# Patient Record
Sex: Female | Born: 1950 | Race: Black or African American | Hispanic: No | Smoking: Current some day smoker
Health system: Southern US, Community
[De-identification: ages and names within clinical notes are randomized; demographics above are authoritative.]

## PROBLEM LIST (undated history)

## (undated) DIAGNOSIS — I639 Cerebral infarction, unspecified: Secondary | ICD-10-CM

## (undated) DIAGNOSIS — E785 Hyperlipidemia, unspecified: Secondary | ICD-10-CM

## (undated) DIAGNOSIS — R42 Dizziness and giddiness: Secondary | ICD-10-CM

## (undated) DIAGNOSIS — F40240 Claustrophobia: Secondary | ICD-10-CM

## (undated) DIAGNOSIS — J439 Emphysema, unspecified: Secondary | ICD-10-CM

## (undated) DIAGNOSIS — J449 Chronic obstructive pulmonary disease, unspecified: Secondary | ICD-10-CM

## (undated) DIAGNOSIS — J40 Bronchitis, not specified as acute or chronic: Secondary | ICD-10-CM

## (undated) HISTORY — DX: Dizziness and giddiness: R42

## (undated) HISTORY — DX: Emphysema, unspecified: J43.9

## (undated) HISTORY — DX: Cerebral infarction, unspecified: I63.9

## (undated) HISTORY — DX: Chronic obstructive pulmonary disease, unspecified: J44.9

## (undated) HISTORY — PX: TUBAL LIGATION: SHX77

## (undated) HISTORY — DX: Bronchitis, not specified as acute or chronic: J40

## (undated) HISTORY — DX: Claustrophobia: F40.240

## (undated) HISTORY — DX: Hyperlipidemia, unspecified: E78.5

---

## 2009-02-21 ENCOUNTER — Encounter: Payer: Self-pay | Admitting: Family

## 2015-02-14 ENCOUNTER — Encounter: Payer: Self-pay | Admitting: Family

## 2015-02-14 DIAGNOSIS — J441 Chronic obstructive pulmonary disease with (acute) exacerbation: Secondary | ICD-10-CM | POA: Diagnosis not present

## 2015-02-14 DIAGNOSIS — J9601 Acute respiratory failure with hypoxia: Secondary | ICD-10-CM | POA: Diagnosis not present

## 2015-02-14 DIAGNOSIS — E785 Hyperlipidemia, unspecified: Secondary | ICD-10-CM | POA: Diagnosis not present

## 2015-02-14 DIAGNOSIS — F17210 Nicotine dependence, cigarettes, uncomplicated: Secondary | ICD-10-CM | POA: Diagnosis not present

## 2015-02-14 DIAGNOSIS — R0602 Shortness of breath: Secondary | ICD-10-CM | POA: Diagnosis not present

## 2015-02-14 DIAGNOSIS — J189 Pneumonia, unspecified organism: Secondary | ICD-10-CM | POA: Diagnosis not present

## 2015-02-14 DIAGNOSIS — J440 Chronic obstructive pulmonary disease with acute lower respiratory infection: Secondary | ICD-10-CM | POA: Diagnosis not present

## 2015-02-15 DIAGNOSIS — J9601 Acute respiratory failure with hypoxia: Secondary | ICD-10-CM | POA: Diagnosis not present

## 2015-02-15 DIAGNOSIS — R0602 Shortness of breath: Secondary | ICD-10-CM | POA: Diagnosis not present

## 2015-02-15 DIAGNOSIS — Z720 Tobacco use: Secondary | ICD-10-CM | POA: Diagnosis not present

## 2015-02-15 DIAGNOSIS — R0902 Hypoxemia: Secondary | ICD-10-CM | POA: Diagnosis not present

## 2015-02-15 DIAGNOSIS — J440 Chronic obstructive pulmonary disease with acute lower respiratory infection: Secondary | ICD-10-CM | POA: Diagnosis present

## 2015-02-15 DIAGNOSIS — J441 Chronic obstructive pulmonary disease with (acute) exacerbation: Secondary | ICD-10-CM | POA: Diagnosis not present

## 2015-02-15 DIAGNOSIS — F17210 Nicotine dependence, cigarettes, uncomplicated: Secondary | ICD-10-CM | POA: Diagnosis not present

## 2015-02-15 DIAGNOSIS — Z8673 Personal history of transient ischemic attack (TIA), and cerebral infarction without residual deficits: Secondary | ICD-10-CM | POA: Diagnosis not present

## 2015-02-15 DIAGNOSIS — J189 Pneumonia, unspecified organism: Secondary | ICD-10-CM | POA: Diagnosis not present

## 2015-02-15 DIAGNOSIS — E785 Hyperlipidemia, unspecified: Secondary | ICD-10-CM | POA: Diagnosis not present

## 2015-02-15 DIAGNOSIS — R0789 Other chest pain: Secondary | ICD-10-CM | POA: Diagnosis not present

## 2015-02-22 DIAGNOSIS — J189 Pneumonia, unspecified organism: Secondary | ICD-10-CM | POA: Diagnosis not present

## 2015-02-22 DIAGNOSIS — J432 Centrilobular emphysema: Secondary | ICD-10-CM | POA: Diagnosis not present

## 2015-02-22 DIAGNOSIS — Z6831 Body mass index (BMI) 31.0-31.9, adult: Secondary | ICD-10-CM | POA: Diagnosis not present

## 2015-02-22 DIAGNOSIS — Z720 Tobacco use: Secondary | ICD-10-CM | POA: Diagnosis not present

## 2015-02-22 DIAGNOSIS — J439 Emphysema, unspecified: Secondary | ICD-10-CM | POA: Diagnosis not present

## 2015-02-22 DIAGNOSIS — J479 Bronchiectasis, uncomplicated: Secondary | ICD-10-CM | POA: Diagnosis not present

## 2015-03-13 DIAGNOSIS — J432 Centrilobular emphysema: Secondary | ICD-10-CM | POA: Diagnosis not present

## 2015-03-13 DIAGNOSIS — J479 Bronchiectasis, uncomplicated: Secondary | ICD-10-CM | POA: Diagnosis not present

## 2015-03-13 DIAGNOSIS — Z720 Tobacco use: Secondary | ICD-10-CM | POA: Diagnosis not present

## 2015-03-13 DIAGNOSIS — Q211 Atrial septal defect: Secondary | ICD-10-CM | POA: Diagnosis not present

## 2015-04-11 DIAGNOSIS — F17200 Nicotine dependence, unspecified, uncomplicated: Secondary | ICD-10-CM | POA: Diagnosis not present

## 2015-04-11 DIAGNOSIS — D72829 Elevated white blood cell count, unspecified: Secondary | ICD-10-CM | POA: Diagnosis not present

## 2015-04-11 DIAGNOSIS — R51 Headache: Secondary | ICD-10-CM | POA: Diagnosis not present

## 2015-04-11 DIAGNOSIS — J440 Chronic obstructive pulmonary disease with acute lower respiratory infection: Secondary | ICD-10-CM | POA: Diagnosis not present

## 2015-04-11 DIAGNOSIS — T380X5A Adverse effect of glucocorticoids and synthetic analogues, initial encounter: Secondary | ICD-10-CM | POA: Diagnosis not present

## 2015-04-11 DIAGNOSIS — J441 Chronic obstructive pulmonary disease with (acute) exacerbation: Secondary | ICD-10-CM | POA: Diagnosis not present

## 2015-04-11 DIAGNOSIS — J209 Acute bronchitis, unspecified: Secondary | ICD-10-CM | POA: Diagnosis not present

## 2015-04-11 DIAGNOSIS — J189 Pneumonia, unspecified organism: Secondary | ICD-10-CM | POA: Diagnosis not present

## 2015-04-11 DIAGNOSIS — R0902 Hypoxemia: Secondary | ICD-10-CM | POA: Diagnosis not present

## 2015-04-11 DIAGNOSIS — R42 Dizziness and giddiness: Secondary | ICD-10-CM | POA: Diagnosis not present

## 2015-04-11 DIAGNOSIS — J449 Chronic obstructive pulmonary disease, unspecified: Secondary | ICD-10-CM | POA: Diagnosis not present

## 2015-04-11 DIAGNOSIS — J45909 Unspecified asthma, uncomplicated: Secondary | ICD-10-CM | POA: Diagnosis not present

## 2015-04-11 DIAGNOSIS — R05 Cough: Secondary | ICD-10-CM | POA: Diagnosis not present

## 2015-04-11 DIAGNOSIS — R0602 Shortness of breath: Secondary | ICD-10-CM | POA: Diagnosis not present

## 2015-04-11 DIAGNOSIS — E876 Hypokalemia: Secondary | ICD-10-CM | POA: Diagnosis not present

## 2015-04-11 DIAGNOSIS — F17210 Nicotine dependence, cigarettes, uncomplicated: Secondary | ICD-10-CM | POA: Diagnosis not present

## 2015-04-12 DIAGNOSIS — E669 Obesity, unspecified: Secondary | ICD-10-CM | POA: Diagnosis present

## 2015-04-12 DIAGNOSIS — Z602 Problems related to living alone: Secondary | ICD-10-CM | POA: Diagnosis present

## 2015-04-12 DIAGNOSIS — R42 Dizziness and giddiness: Secondary | ICD-10-CM | POA: Diagnosis not present

## 2015-04-12 DIAGNOSIS — E7800 Pure hypercholesterolemia, unspecified: Secondary | ICD-10-CM | POA: Diagnosis present

## 2015-04-12 DIAGNOSIS — J440 Chronic obstructive pulmonary disease with acute lower respiratory infection: Secondary | ICD-10-CM | POA: Diagnosis present

## 2015-04-12 DIAGNOSIS — J441 Chronic obstructive pulmonary disease with (acute) exacerbation: Secondary | ICD-10-CM | POA: Diagnosis not present

## 2015-04-12 DIAGNOSIS — J181 Lobar pneumonia, unspecified organism: Secondary | ICD-10-CM | POA: Diagnosis not present

## 2015-04-12 DIAGNOSIS — R000 Tachycardia, unspecified: Secondary | ICD-10-CM | POA: Diagnosis not present

## 2015-04-12 DIAGNOSIS — J189 Pneumonia, unspecified organism: Secondary | ICD-10-CM | POA: Diagnosis not present

## 2015-04-12 DIAGNOSIS — R0902 Hypoxemia: Secondary | ICD-10-CM | POA: Diagnosis not present

## 2015-04-12 DIAGNOSIS — F17200 Nicotine dependence, unspecified, uncomplicated: Secondary | ICD-10-CM | POA: Diagnosis not present

## 2015-04-12 DIAGNOSIS — R51 Headache: Secondary | ICD-10-CM | POA: Diagnosis not present

## 2015-04-12 DIAGNOSIS — D72829 Elevated white blood cell count, unspecified: Secondary | ICD-10-CM | POA: Diagnosis present

## 2015-04-12 DIAGNOSIS — E876 Hypokalemia: Secondary | ICD-10-CM | POA: Diagnosis not present

## 2015-04-12 DIAGNOSIS — J209 Acute bronchitis, unspecified: Secondary | ICD-10-CM | POA: Diagnosis present

## 2015-04-12 DIAGNOSIS — T380X5A Adverse effect of glucocorticoids and synthetic analogues, initial encounter: Secondary | ICD-10-CM | POA: Diagnosis present

## 2015-04-12 DIAGNOSIS — I10 Essential (primary) hypertension: Secondary | ICD-10-CM | POA: Diagnosis present

## 2015-04-12 DIAGNOSIS — R739 Hyperglycemia, unspecified: Secondary | ICD-10-CM | POA: Diagnosis present

## 2015-04-12 DIAGNOSIS — J45909 Unspecified asthma, uncomplicated: Secondary | ICD-10-CM | POA: Diagnosis present

## 2015-04-12 DIAGNOSIS — F17210 Nicotine dependence, cigarettes, uncomplicated: Secondary | ICD-10-CM | POA: Diagnosis present

## 2015-04-12 DIAGNOSIS — Z6831 Body mass index (BMI) 31.0-31.9, adult: Secondary | ICD-10-CM | POA: Diagnosis not present

## 2015-04-17 DIAGNOSIS — Z6832 Body mass index (BMI) 32.0-32.9, adult: Secondary | ICD-10-CM | POA: Diagnosis not present

## 2015-04-17 DIAGNOSIS — J42 Unspecified chronic bronchitis: Secondary | ICD-10-CM | POA: Diagnosis not present

## 2015-04-17 DIAGNOSIS — Z9981 Dependence on supplemental oxygen: Secondary | ICD-10-CM | POA: Diagnosis not present

## 2015-04-17 DIAGNOSIS — J449 Chronic obstructive pulmonary disease, unspecified: Secondary | ICD-10-CM | POA: Diagnosis not present

## 2015-05-01 DIAGNOSIS — E785 Hyperlipidemia, unspecified: Secondary | ICD-10-CM | POA: Diagnosis not present

## 2015-05-01 DIAGNOSIS — F17200 Nicotine dependence, unspecified, uncomplicated: Secondary | ICD-10-CM | POA: Diagnosis not present

## 2015-05-01 DIAGNOSIS — J479 Bronchiectasis, uncomplicated: Secondary | ICD-10-CM | POA: Diagnosis not present

## 2015-05-01 DIAGNOSIS — J441 Chronic obstructive pulmonary disease with (acute) exacerbation: Secondary | ICD-10-CM | POA: Diagnosis not present

## 2015-05-01 DIAGNOSIS — Z79899 Other long term (current) drug therapy: Secondary | ICD-10-CM | POA: Diagnosis not present

## 2015-05-01 DIAGNOSIS — Z720 Tobacco use: Secondary | ICD-10-CM | POA: Diagnosis not present

## 2015-05-01 DIAGNOSIS — J189 Pneumonia, unspecified organism: Secondary | ICD-10-CM | POA: Diagnosis not present

## 2015-05-01 DIAGNOSIS — J432 Centrilobular emphysema: Secondary | ICD-10-CM | POA: Diagnosis not present

## 2015-05-15 DIAGNOSIS — I2510 Atherosclerotic heart disease of native coronary artery without angina pectoris: Secondary | ICD-10-CM | POA: Diagnosis not present

## 2015-05-15 DIAGNOSIS — E7800 Pure hypercholesterolemia, unspecified: Secondary | ICD-10-CM | POA: Diagnosis not present

## 2015-05-15 DIAGNOSIS — J449 Chronic obstructive pulmonary disease, unspecified: Secondary | ICD-10-CM | POA: Diagnosis not present

## 2015-05-15 DIAGNOSIS — R0602 Shortness of breath: Secondary | ICD-10-CM | POA: Diagnosis not present

## 2015-05-15 DIAGNOSIS — I10 Essential (primary) hypertension: Secondary | ICD-10-CM | POA: Diagnosis not present

## 2015-06-05 DIAGNOSIS — M25511 Pain in right shoulder: Secondary | ICD-10-CM | POA: Diagnosis not present

## 2015-06-05 DIAGNOSIS — J449 Chronic obstructive pulmonary disease, unspecified: Secondary | ICD-10-CM | POA: Diagnosis not present

## 2015-06-05 DIAGNOSIS — Z79899 Other long term (current) drug therapy: Secondary | ICD-10-CM | POA: Diagnosis not present

## 2015-06-05 DIAGNOSIS — F17200 Nicotine dependence, unspecified, uncomplicated: Secondary | ICD-10-CM | POA: Diagnosis not present

## 2015-06-05 DIAGNOSIS — E785 Hyperlipidemia, unspecified: Secondary | ICD-10-CM | POA: Diagnosis not present

## 2015-06-19 DIAGNOSIS — J432 Centrilobular emphysema: Secondary | ICD-10-CM | POA: Diagnosis not present

## 2015-06-19 DIAGNOSIS — J479 Bronchiectasis, uncomplicated: Secondary | ICD-10-CM | POA: Diagnosis not present

## 2015-06-19 DIAGNOSIS — Z720 Tobacco use: Secondary | ICD-10-CM | POA: Diagnosis not present

## 2015-06-22 DIAGNOSIS — Z6832 Body mass index (BMI) 32.0-32.9, adult: Secondary | ICD-10-CM | POA: Diagnosis not present

## 2015-06-22 DIAGNOSIS — G8321 Monoplegia of upper limb affecting right dominant side: Secondary | ICD-10-CM | POA: Diagnosis not present

## 2015-06-22 DIAGNOSIS — M542 Cervicalgia: Secondary | ICD-10-CM | POA: Diagnosis not present

## 2015-07-13 DIAGNOSIS — M25511 Pain in right shoulder: Secondary | ICD-10-CM | POA: Diagnosis not present

## 2015-07-13 DIAGNOSIS — Z6832 Body mass index (BMI) 32.0-32.9, adult: Secondary | ICD-10-CM | POA: Diagnosis not present

## 2015-07-13 DIAGNOSIS — M542 Cervicalgia: Secondary | ICD-10-CM | POA: Diagnosis not present

## 2015-07-18 DIAGNOSIS — D492 Neoplasm of unspecified behavior of bone, soft tissue, and skin: Secondary | ICD-10-CM | POA: Diagnosis not present

## 2015-07-18 DIAGNOSIS — L578 Other skin changes due to chronic exposure to nonionizing radiation: Secondary | ICD-10-CM | POA: Diagnosis not present

## 2015-07-18 DIAGNOSIS — L918 Other hypertrophic disorders of the skin: Secondary | ICD-10-CM | POA: Diagnosis not present

## 2015-07-18 DIAGNOSIS — L820 Inflamed seborrheic keratosis: Secondary | ICD-10-CM | POA: Diagnosis not present

## 2015-11-09 ENCOUNTER — Encounter: Payer: Self-pay | Admitting: Internal Medicine

## 2015-11-09 ENCOUNTER — Ambulatory Visit (INDEPENDENT_AMBULATORY_CARE_PROVIDER_SITE_OTHER): Payer: Medicare Other | Admitting: Internal Medicine

## 2015-11-09 VITALS — BP 108/66 | HR 101 | Ht 65.0 in | Wt 180.0 lb

## 2015-11-09 DIAGNOSIS — Z8701 Personal history of pneumonia (recurrent): Secondary | ICD-10-CM

## 2015-11-09 DIAGNOSIS — J441 Chronic obstructive pulmonary disease with (acute) exacerbation: Secondary | ICD-10-CM | POA: Diagnosis not present

## 2015-11-09 DIAGNOSIS — F17200 Nicotine dependence, unspecified, uncomplicated: Secondary | ICD-10-CM | POA: Diagnosis not present

## 2015-11-09 DIAGNOSIS — J449 Chronic obstructive pulmonary disease, unspecified: Secondary | ICD-10-CM | POA: Diagnosis not present

## 2015-11-09 MED ORDER — FLUTICASONE-SALMETEROL 250-50 MCG/DOSE IN AEPB
1.0000 | INHALATION_SPRAY | Freq: Two times a day (BID) | RESPIRATORY_TRACT | 0 refills | Status: DC
Start: 2015-11-09 — End: 2015-11-09

## 2015-11-09 MED ORDER — PREDNISONE 10 MG PO TABS: ORAL_TABLET | ORAL | 0 refills | Status: DC

## 2015-11-09 MED ORDER — DOXYCYCLINE HYCLATE 100 MG PO TABS: 100.0000 mg | ORAL_TABLET | Freq: Two times a day (BID) | ORAL | 0 refills | Status: DC

## 2015-11-09 MED ORDER — LEVALBUTEROL HCL 0.63 MG/3ML IN NEBU
0.6300 mg | INHALATION_SOLUTION | Freq: Once | RESPIRATORY_TRACT | Status: AC
  Administered 2015-11-09: 0.63 mg via RESPIRATORY_TRACT

## 2015-11-09 MED ORDER — TIOTROPIUM BROMIDE MONOHYDRATE 18 MCG IN CAPS: 18.0000 ug | ORAL_CAPSULE | Freq: Every day | RESPIRATORY_TRACT | 11 refills | Status: DC

## 2015-11-09 MED ORDER — METHYLPREDNISOLONE ACETATE 80 MG/ML IJ SUSP
80.0000 mg | Freq: Once | INTRAMUSCULAR | Status: AC
  Administered 2015-11-09: 80 mg via INTRAMUSCULAR

## 2015-11-09 MED ORDER — FLUTICASONE-SALMETEROL 250-50 MCG/DOSE IN AEPB: 1.0000 | INHALATION_SPRAY | Freq: Two times a day (BID) | RESPIRATORY_TRACT | 11 refills | Status: DC

## 2015-11-09 NOTE — Progress Notes (Signed)
Subjective:    Patient ID: Rachel Wang, female    DOB: 1950/03/21, 65 y.o.   MRN: [id number]  PCP PROVIDER NOT IN SYSTEM  HPI  IOV 11/09/2015  Chief Complaint  Patient presents with  . Pulmonary Consult    Self Referral, Pt. c/o recently moved here, having to use her oxygen more, has a portable tank as well as one at home, SOB all the time, been out of her inhalers since wed., prior to running out she was using them alot, coughing with phelgm,      [name]. Recently moved from [place] to [place], [place] in the last few months after recently being married. She reports a long-standing history of COPD. She does not know her severe anemia but she says it is quite severe. At baseline she is on oxygen with exertion and triple inhaler therapy with Spiriva and Advair. She used to be taken care of her pulmonologist at [facility]. Also record showed alpha-1 MM. She has a sister with COPD on oxygen. Her father died from AIDS. She tells me that overall she's been stable except that in January 2017 she had an admission for pneumonia and COPD exacerbation. She then had another admission for the same in March 2017. Around this time she did have a CT chest for her history that showed clearance of the pneumonia. The only report I have is a CT chest from January 2017. I do not have the images for visualization with the reports a severe emphysema and patchy bilateral airspace disease compatible with pneumonia. She says when she moved here she was stable she had her oxygen but she's run out of her inhalers. The last few weeks a COPD has had exacerbation due to running out of inhalers and also the change in weather. She is more short of breath, more wheezing. Earlier this week she nearly passed out when she was off oxygen while shopping at [place].  Unfortunately she still smoking   has a past medical history of Bronchitis; COPD (chronic obstructive pulmonary disease)  ([place]); and Emphysema of lung ([place]).   reports that she has been smoking Cigarettes.  She has smoked for the past 20.00 years. She has never used smokeless tobacco.  No past surgical history on file.  Allergies  Allergen Reactions  . Levofloxacin Nausea And Vomiting  . Tramadol Nausea And Vomiting     There is no immunization history on file for this patient.  No family history on file.   Current Outpatient Prescriptions:  .  aspirin 81 MG chewable tablet, Chew 81 mg by mouth daily., Disp: , Rfl:  .  diclofenac (VOLTAREN) 75 MG EC tablet, Take 75 mg by mouth 2 (two) times daily., Disp: , Rfl:  .  SIMVASTATIN PO, Take 45 mg by mouth., Disp: , Rfl:  .  budesonide (PULMICORT) 0.5 MG/2ML nebulizer solution, Take 0.5 mg by nebulization 2 (two) times daily., Disp: , Rfl:  .  Fluticasone-Salmeterol (ADVAIR DISKUS) 250-50 MCG/DOSE AEPB, Inhale 1 puff into the lungs 2 (two) times daily., Disp: , Rfl:  .  tiotropium (SPIRIVA) 18 MCG inhalation capsule, Place 18 mcg into inhaler and inhale daily., Disp: , Rfl:     Review of Systems  Constitutional: Positive for fever. Negative for unexpected weight change.  HENT: Positive for congestion. Negative for dental problem, ear pain, nosebleeds, postnasal drip, rhinorrhea, sinus pressure, sneezing, sore throat and trouble swallowing.   Eyes: Negative.  Negative for redness and itching.  Respiratory: Positive for  cough, chest tightness, shortness of breath and wheezing.   Cardiovascular: Negative.  Negative for palpitations and leg swelling.  Gastrointestinal: Positive for vomiting. Negative for nausea.  Endocrine: Negative.   Genitourinary: Negative.  Negative for dysuria.  Musculoskeletal: Negative.  Negative for joint swelling.  Skin: Positive for rash.  Allergic/Immunologic: Negative.   Neurological: Negative for headaches.  Hematological: Negative.  Does not bruise/bleed easily.  Psychiatric/Behavioral: Negative.  Negative for dysphoric  mood. The patient is not nervous/anxious.        Objective:   Physical Exam  Constitutional: She is oriented to person, place, and time. She appears well-developed and well-nourished. No distress.  HENT:  Head: Normocephalic and atraumatic.  Right Ear: External ear normal.  Left Ear: External ear normal.  Mouth/Throat: Oropharynx is clear and moist. No oropharyngeal exudate.  Eyes: Conjunctivae and EOM are normal. Pupils are equal, round, and reactive to light. Right eye exhibits no discharge. Left eye exhibits no discharge. No scleral icterus.  Neck: Normal range of motion. Neck supple. No JVD present. No tracheal deviation present. No thyromegaly present.  Cardiovascular: Normal rate, regular rhythm, normal heart sounds and intact distal pulses.  Exam reveals no gallop and no friction rub.   No murmur heard. Pulmonary/Chest: Effort normal. No respiratory distress. She has no wheezes. She has rales. She exhibits no tenderness.  Looks well without his work of breathing but has significant bilateral wheezing present  Abdominal: Soft. Bowel sounds are normal. She exhibits no distension and no mass. There is no tenderness. There is no rebound and no guarding.  Musculoskeletal: Normal range of motion. She exhibits no edema or tenderness.  Lymphadenopathy:    She has no cervical adenopathy.  Neurological: She is alert and oriented to person, place, and time. She has normal reflexes. No cranial nerve deficit. She exhibits normal muscle tone. Coordination normal.  Skin: Skin is warm and dry. No rash noted. She is not diaphoretic. No erythema. No pallor.  Psychiatric: She has a normal mood and affect. Her behavior is normal. Judgment and thought content normal.  Vitals reviewed.   Vitals:   11/09/15 1140  BP: 108/66  Pulse: (!) 101  SpO2: 92%  Weight: 180 lb (81.6 kg)  Height: 5\' 5"  (1.651 m)   Estimated body mass index is 29.95 kg/m as calculated from the following:   Height as of this  encounter: 5\' 5"  (1.651 m).   Weight as of this encounter: 180 lb (81.6 kg).        Assessment & Plan:     ICD-9-CM ICD-10-CM   1. COPD exacerbation ([place]) 491.21 J44.1   2. Chronic obstructive pulmonary disease, unspecified COPD type ([place]) 496 J44.9   3. Current every day smoker 305.1 F17.200   4. History of pneumonia V12.61 Z87.01        Xopenex neb in office IM depot medrol 80mg  IM x 1 in office Take doxycycline 100mg  po twice daily x 5 days; take after meals and avoid sunlight Take prednisone 40 mg daily x 2 days, then 20mg  daily x 2 days, then 10mg  daily x 2 days, then 5mg  daily x 2 days and stop Restart spiriva and advair -t ake sample/script Continue o2 with exertion Use albuterol as needed - take script Quit smoking  Do full PFT in 3 weeks Do CT chest in 3 weeks  Followup In 3 weeks with APP or Dr Chase Caller (AM appt only) -after PFT and CT chest  - discuss results of PFT and CT andprogres  -  discuss vaccination at followup      Dr. Brand Males, M.D., [facility].C.P Pulmonary and Critical Care Medicine Staff Physician [facility] Pager: [phone], If no answer or between  15:00h - 7:00h: call [phone]  11/09/2015 12:13 PM

## 2015-11-09 NOTE — Patient Instructions (Signed)
ICD-9-CM ICD-10-CM   1. COPD exacerbation ([place]) 491.21 J44.1   2. Chronic obstructive pulmonary disease, unspecified COPD type ([place]) 496 J44.9   3. Current every day smoker 305.1 F17.200   4. History of pneumonia V12.61 Z87.01      Xopenex neb in office IM depot medrol 80mg  IM x 1 in office Take doxycycline 100mg  po twice daily x 5 days; take after meals and avoid sunlight Take prednisone 40 mg daily x 2 days, then 20mg  daily x 2 days, then 10mg  daily x 2 days, then 5mg  daily x 2 days and stop Restart spiriva and advair -t ake sample/script Continue o2 with exertion Use albuterol as needed - take script Quit smoking  Do full PFT in 3 weeks Do CT chest in 3 weeks  Followup In 3 weeks with APP or Dr Chase Caller (AM appt only) -after PFT and CT chest  - discuss results of PFT and CT andprogres  - discuss vaccination at followup

## 2015-11-16 ENCOUNTER — Encounter: Payer: Self-pay | Admitting: Internal Medicine

## 2015-11-19 ENCOUNTER — Encounter: Payer: Self-pay | Admitting: Internal Medicine

## 2015-12-04 ENCOUNTER — Ambulatory Visit (INDEPENDENT_AMBULATORY_CARE_PROVIDER_SITE_OTHER)
Admission: RE | Admit: 2015-12-04 | Discharge: 2015-12-04 | Disposition: A | Discharge status: Home / Self Care | Payer: Medicare Other | Source: Ambulatory Visit | Attending: Internal Medicine | Admitting: Internal Medicine

## 2015-12-04 DIAGNOSIS — J449 Chronic obstructive pulmonary disease, unspecified: Secondary | ICD-10-CM

## 2015-12-04 DIAGNOSIS — F17200 Nicotine dependence, unspecified, uncomplicated: Secondary | ICD-10-CM

## 2015-12-04 DIAGNOSIS — J439 Emphysema, unspecified: Secondary | ICD-10-CM | POA: Diagnosis not present

## 2015-12-05 ENCOUNTER — Ambulatory Visit: Payer: Medicare Other | Admitting: Internal Medicine

## 2015-12-05 ENCOUNTER — Encounter (HOSPITAL_COMMUNITY): Payer: Medicare Other

## 2015-12-28 ENCOUNTER — Ambulatory Visit (HOSPITAL_COMMUNITY)
Admission: RE | Admit: 2015-12-28 | Discharge: 2015-12-28 | Disposition: A | Discharge status: Home / Self Care | Payer: Medicare Other | Source: Ambulatory Visit | Attending: Internal Medicine | Admitting: Internal Medicine

## 2015-12-28 ENCOUNTER — Encounter: Payer: Self-pay | Admitting: Adult Health

## 2015-12-28 ENCOUNTER — Ambulatory Visit: Payer: Medicare Other | Admitting: Internal Medicine

## 2015-12-28 ENCOUNTER — Ambulatory Visit (INDEPENDENT_AMBULATORY_CARE_PROVIDER_SITE_OTHER): Payer: Medicare Other | Admitting: Adult Health

## 2015-12-28 VITALS — BP 100/60 | HR 100 | Temp 97.3°F | Ht 65.0 in | Wt 183.4 lb

## 2015-12-28 DIAGNOSIS — J441 Chronic obstructive pulmonary disease with (acute) exacerbation: Secondary | ICD-10-CM | POA: Insufficient documentation

## 2015-12-28 DIAGNOSIS — I2510 Atherosclerotic heart disease of native coronary artery without angina pectoris: Secondary | ICD-10-CM

## 2015-12-28 DIAGNOSIS — F17200 Nicotine dependence, unspecified, uncomplicated: Secondary | ICD-10-CM

## 2015-12-28 DIAGNOSIS — J449 Chronic obstructive pulmonary disease, unspecified: Secondary | ICD-10-CM | POA: Diagnosis not present

## 2015-12-28 LAB — PULMONARY FUNCTION TEST
DL/VA % pred: 78 %
DL/VA: 3.87 ml/min/mmHg/L
DLCO UNC: 9.64 ml/min/mmHg
DLCO unc % pred: 37 %
FEF 25-75 Post: 1.23 L/sec
FEF 25-75 Pre: 0.99 L/sec
FEF2575-%Change-Post: 24 %
FEF2575-%PRED-PRE: 50 %
FEF2575-%Pred-Post: 63 %
FEV1-%Change-Post: 5 %
FEV1-%PRED-POST: 63 %
FEV1-%PRED-PRE: 60 %
FEV1-PRE: 1.24 L
FEV1-Post: 1.3 L
FEV1FVC-%Change-Post: 3 %
FEV1FVC-%Pred-Pre: 95 %
FEV6-%Change-Post: 1 %
FEV6-%PRED-POST: 65 %
FEV6-%PRED-PRE: 64 %
FEV6-POST: 1.66 L
FEV6-Pre: 1.64 L
FEV6FVC-%CHANGE-POST: 0 %
FEV6FVC-%PRED-POST: 102 %
FEV6FVC-%Pred-Pre: 102 %
FVC-%Change-Post: 1 %
FVC-%PRED-PRE: 63 %
FVC-%Pred-Post: 63 %
FVC-POST: 1.68 L
FVC-PRE: 1.66 L
POST FEV6/FVC RATIO: 99 %
PRE FEV1/FVC RATIO: 75 %
Post FEV1/FVC ratio: 78 %
Pre FEV6/FVC Ratio: 99 %
RV % pred: 89 %
RV: 1.92 L
TLC % PRED: 67 %
TLC: 3.52 L

## 2015-12-28 MED ORDER — ALBUTEROL SULFATE (2.5 MG/3ML) 0.083% IN NEBU
2.5000 mg | INHALATION_SOLUTION | Freq: Once | RESPIRATORY_TRACT | Status: AC
  Administered 2015-12-28: 2.5 mg via RESPIRATORY_TRACT

## 2015-12-28 MED ORDER — UMECLIDINIUM BROMIDE 62.5 MCG/INH IN AEPB
1.0000 | INHALATION_SPRAY | Freq: Every day | RESPIRATORY_TRACT | 0 refills | Status: DC
Start: 2015-12-28 — End: 2015-12-28

## 2015-12-28 MED ORDER — UMECLIDINIUM BROMIDE 62.5 MCG/INH IN AEPB: 1.0000 | INHALATION_SPRAY | Freq: Every day | RESPIRATORY_TRACT | 4 refills | Status: DC

## 2015-12-28 NOTE — Patient Instructions (Addendum)
Stop Spiriva  Begin Incruse 1 puff daily .  Continue on Advair 1 puff Twice daily  .  Rinse after inhaler use.  Work on not smoking .  follow up Dr. Chase Caller in 3-4 months and As needed   Refer for Primary MD to establish , discuss with them plaque buildup in arteries that was seen on Chest CT

## 2015-12-28 NOTE — Progress Notes (Signed)
Subjective:    Patient ID: Rachel Wang, female    DOB: 08-12-1950, 65 y.o.   MRN: [id number]  HPI 65 yo female smoker followed for moderate COPD .   TEST  Alpha 1 reported as MM   12/28/2015 Follow up : COPD /PFT/CT results.  Pt returns for 6 weeks follow up .  She was seen for pulmonary consult to establish for COPD. She recently moved here from [place]. She is on Advair and Spiriva. Uses oxygen with act As needed  And At bedtime   And tx for COPD exacerbation with Doxycycline and prednisone taper.  She is feeling better. Does have some daily cough and gets winded with activity .  She was set up for CT and PFT which we revewied today .  CT chest 12/04/15 showed emphysematous changes and pulmonary scarring w/ no acute process.  Did show age advance 3 vessel coronary artery califications.  PFT 12/28/15 shows FEV1 63%, ratio 78, FVC 63%, DLCO 37, TLC 67%.  Spiriva no longer covered. We discussed alternatives that are covered by her insurance as  She continues to smoking , advised on cessation.  Declines flu shot .      Past Medical History:  Diagnosis Date  . Bronchitis   . COPD (chronic obstructive pulmonary disease) ([place])   . Emphysema of lung [facility])    Current Outpatient Prescriptions on File Prior to Visit  Medication Sig Dispense Refill  . aspirin 81 MG chewable tablet Chew 81 mg by mouth daily.    . budesonide (PULMICORT) 0.5 MG/2ML nebulizer solution Take 0.5 mg by nebulization 2 (two) times daily.    . diclofenac (VOLTAREN) 75 MG EC tablet Take 75 mg by mouth 2 (two) times daily.    . Fluticasone-Salmeterol (ADVAIR DISKUS) 250-50 MCG/DOSE AEPB Inhale 1 puff into the lungs 2 (two) times daily. 60 each 11  . SIMVASTATIN PO Take 45 mg by mouth.    . tiotropium (SPIRIVA) 18 MCG inhalation capsule Place 1 capsule (18 mcg total) into inhaler and inhale daily. 30 capsule 11   No current facility-administered medications on file prior to visit.     Review of  Systems Constitutional:   No  weight loss, night sweats,  Fevers, chills +, fatigue, or  lassitude.  HEENT:   No headaches,  Difficulty swallowing,  Tooth/dental problems, or  Sore throat,                No sneezing, itching, ear ache, nasal congestion, post nasal drip,   CV:  No chest pain,  Orthopnea, PND, swelling in lower extremities, anasarca, dizziness, palpitations, syncope.   GI  No heartburn, indigestion, abdominal pain, nausea, vomiting, diarrhea, change in bowel habits, loss of appetite, bloody stools.   Resp:   No chest wall deformity  Skin: no rash or lesions.  GU: no dysuria, change in color of urine, no urgency or frequency.  No flank pain, no hematuria   MS:  No joint pain or swelling.  No decreased range of motion.  No back pain.  Psych:  No change in mood or affect. No depression or anxiety.  No memory loss.         Objective:   Physical Exam   Vitals:   12/28/15 1421  BP: 100/60  Pulse: 100  Temp: 97.3 F (36.3 C)  TempSrc: Oral  SpO2: 94%  Weight: 183 lb 6.4 oz (83.2 kg)  Height: 5\' 5"  (1.651 m)    GEN: A/Ox3; pleasant ,  NAD, well nourished    HEENT:  [place]/AT,  EACs-clear, TMs-wnl, NOSE-clear, THROAT-clear, no lesions, no postnasal drip or exudate noted.   NECK:  Supple w/ fair ROM; no JVD; normal carotid impulses w/o bruits; no thyromegaly or nodules palpated; no lymphadenopathy.    RESP  Clear  P & A; w/o, wheezes/ rales/ or rhonchi. no accessory muscle use, no dullness to percussion  CARD:  RRR, no m/r/g  , no peripheral edema, pulses intact, no cyanosis or clubbing.  GI:   Soft & nt; nml bowel sounds; no organomegaly or masses detected.   Musco: Warm bil, no deformities or joint swelling noted.   Neuro: alert, no focal deficits noted.    Skin: Warm, no lesions or rashes   CT chest 12/04/15 Emphysematous changes and pulmonary scarring but no acute overlying pulmonary process or worrisome pulmonary lesions.  No mediastinal or hilar  mass or adenopathy.  Age advanced three-vessel coronary artery calcifications.   Alonie Gazzola NP-C  [facility]  12/28/2015        Assessment & Plan:

## 2015-12-31 NOTE — Assessment & Plan Note (Signed)
Smoking cessation discussed 

## 2015-12-31 NOTE — Assessment & Plan Note (Addendum)
Moderate COPD in active smoker Smoking cessation is key .  Change Spiriva to Incruse d/t insurance coverage CT does show COPD changes  Will refer to PCP to establish , CT does show some plaque buildup in arteries , will need to determine if additional testing indicated . Plan  Patient Instructions  Stop Spiriva  Begin Incruse 1 puff daily .  Continue on Advair 1 puff Twice daily  .  Rinse after inhaler use.  Work on not smoking .  follow up Dr. Chase Caller in 3-4 months and As needed   Refer for Primary MD to establish , discuss with them plaque buildup in arteries that was seen on Chest CT

## 2016-01-16 ENCOUNTER — Ambulatory Visit: Payer: Medicare Other | Admitting: Family

## 2016-01-25 ENCOUNTER — Ambulatory Visit (INDEPENDENT_AMBULATORY_CARE_PROVIDER_SITE_OTHER): Payer: Medicare Other | Admitting: Family

## 2016-01-25 ENCOUNTER — Encounter: Payer: Self-pay | Admitting: Family

## 2016-01-25 VITALS — BP 118/80 | HR 95 | Temp 98.2°F | Resp 18 | Ht 65.0 in | Wt 185.0 lb

## 2016-01-25 DIAGNOSIS — F17200 Nicotine dependence, unspecified, uncomplicated: Secondary | ICD-10-CM | POA: Diagnosis not present

## 2016-01-25 DIAGNOSIS — Z8673 Personal history of transient ischemic attack (TIA), and cerebral infarction without residual deficits: Secondary | ICD-10-CM | POA: Diagnosis not present

## 2016-01-25 DIAGNOSIS — E782 Mixed hyperlipidemia: Secondary | ICD-10-CM | POA: Diagnosis not present

## 2016-01-25 DIAGNOSIS — E785 Hyperlipidemia, unspecified: Secondary | ICD-10-CM | POA: Insufficient documentation

## 2016-01-25 DIAGNOSIS — I2510 Atherosclerotic heart disease of native coronary artery without angina pectoris: Secondary | ICD-10-CM | POA: Diagnosis not present

## 2016-01-25 DIAGNOSIS — J449 Chronic obstructive pulmonary disease, unspecified: Secondary | ICD-10-CM

## 2016-01-25 MED ORDER — SIMVASTATIN 40 MG PO TABS: 40.0000 mg | ORAL_TABLET | Freq: Every day | ORAL | 0 refills | Status: DC

## 2016-01-25 NOTE — Assessment & Plan Note (Signed)
Chronic obstructive pulmonary disease currently managed through pulmonology and appears stable with current medication regimen and no adverse side effects. Patient continues to remain on 2 L of oxygen via nasal cannula as needed for physical activity and at night. Attempted recertification with seated oxygenation of 96% and with exercise 91%. Advised to follow-up with pulmonology for further assessment and testing as oxygen may no longer be necessary with current results. Continue current dosage of fluticasone-salmeterol, ipratropium-albuterol, and Incruse with changes and follow-up per pulmonology.

## 2016-01-25 NOTE — Progress Notes (Signed)
Subjective:    Patient ID: Rachel Wang, female    DOB: 22-Dec-1950, 65 y.o.   MRN: [id number]  Chief Complaint  Patient presents with  . Establish Care    needs re evaluation for oxygen sent to [facility], said she was evaluated in december and needs cholesterol meds    HPI:  Rachel Wang is a 65 y.o. female who  has a past medical history of Bronchitis; COPD (chronic obstructive pulmonary disease) ([place]); Emphysema of lung ([place]); Hyperlipidemia; and Stroke ([place]). and presents today for an office visit to establish care.   1.) COPD - Previously diagnosed with COPD and currently maintained on fluticasone-salmeterol, ipratropium-albuterol, and Incruse. Reports taking the medication as prescribed and denies adverse side effects. Maintained on 2L of oxygen with activity as needed and at bedtime as she needs, but not every night. She does continue to smoke.  2.) Hyperlipidemia - Previously diagnosed with hyperlipidemia and maintained on simvastatin. Has been out of medication for a couple of days. Denies adverse side effects when taking the medication. No chest pain or heart palpitations.  No results found for: CHOL, HDL, LDLCALC, LDLDIRECT, TRIG, CHOLHDL   3.) Tobacco use - Currently smokes approximately 8 cigarettes per day. Denies any previous history of tobacco cessation attempts. Indicates she is not ready to quit smoking at this time as she is under significant amount of stress and worries that may be a barrier to her tobacco cessation.  Allergies  Allergen Reactions  . Levofloxacin Nausea And Vomiting  . Tramadol Nausea And Vomiting      Outpatient Medications Prior to Visit  Medication Sig Dispense Refill  . aspirin 81 MG chewable tablet Chew 81 mg by mouth daily.    . diclofenac (VOLTAREN) 75 MG EC tablet Take 75 mg by mouth 2 (two) times daily.    . Fluticasone-Salmeterol (ADVAIR DISKUS) 250-50 MCG/DOSE AEPB Inhale 1 puff into the lungs 2 (two) times daily. 60 each 11    . ipratropium-albuterol (DUONEB) 0.5-2.5 (3) MG/3ML SOLN Take 3 mLs by nebulization every 6 (six) hours as needed.    . umeclidinium bromide (INCRUSE ELLIPTA) 62.5 MCG/INH AEPB Inhale 1 puff into the lungs daily. 30 each 4  . SIMVASTATIN PO Take 45 mg by mouth.    . tiotropium (SPIRIVA) 18 MCG inhalation capsule Place 1 capsule (18 mcg total) into inhaler and inhale daily. 30 capsule 11   No facility-administered medications prior to visit.      History reviewed. No pertinent surgical history.    Past Medical History:  Diagnosis Date  . Bronchitis   . COPD (chronic obstructive pulmonary disease) ([place])   . Emphysema of lung ([place])   . Hyperlipidemia   . Stroke [facility])      Review of Systems  Constitutional: Negative for chills and fever.  Eyes:       Negative for changes in vision  Respiratory: Negative for cough, chest tightness, shortness of breath and wheezing.   Cardiovascular: Negative for chest pain, palpitations and leg swelling.  Neurological: Negative for dizziness, weakness and light-headedness.      Objective:    BP 118/80 (BP Location: Left Arm, Patient Position: Sitting, Cuff Size: Normal)   Pulse 95   Temp 98.2 F (36.8 C) (Oral)   Resp 18   Ht 5\' 5"  (1.651 m)   Wt 185 lb (83.9 kg)   SpO2 96%   BMI 30.79 kg/m  Nursing note and vital signs reviewed.  SPO2 at rest = 96% SPO2 with  exercise = 91%  Physical Exam  Constitutional: She is oriented to person, place, and time. She appears well-developed and well-nourished. No distress.  Cardiovascular: Normal rate, regular rhythm, normal heart sounds and intact distal pulses.   Pulmonary/Chest: Effort normal. No respiratory distress. She has wheezes. She has no rales. She exhibits no tenderness.  Neurological: She is alert and oriented to person, place, and time.  Skin: Skin is warm and dry.  Psychiatric: She has a normal mood and affect. Her behavior is normal. Judgment and thought content normal.        Assessment & Plan:   Problem List Items Addressed This Visit      Respiratory   Chronic obstructive pulmonary disease ([place]) - Primary    Chronic obstructive pulmonary disease currently managed through pulmonology and appears stable with current medication regimen and no adverse side effects. Patient continues to remain on 2 L of oxygen via nasal cannula as needed for physical activity and at night. Attempted recertification with seated oxygenation of 96% and with exercise 91%. Advised to follow-up with pulmonology for further assessment and testing as oxygen may no longer be necessary with current results. Continue current dosage of fluticasone-salmeterol, ipratropium-albuterol, and Incruse with changes and follow-up per pulmonology.        Other   Current every day smoker    Continues to smoke approximately 8 cigarettes per day and expresses difficulty with managing stress which is a barrier to her tobacco cessation program. Not ready to quit smoking at this time. Continue to monitor.      Hyperlipidemia    Previously diagnosed with hyperlipidemia and maintained on simvastatin. Obtain lipid profile to check current status. Continue current dosage of simvastatin pending lab work results. Continue to monitor.      Relevant Medications   simvastatin (ZOCOR) 40 MG tablet   Other Relevant Orders   Lipid Profile   History of stroke    Previously diagnosed with 2 strokes in the past with risk factors including hyperlipidemia. Blood pressure appears adequately controlled. BMI does indicate obesity. Continue to monitor stroke risk factors and medication for modification to prevent further stroke in the future.          I have discontinued Ms. Berisha's SIMVASTATIN PO and tiotropium. I have also changed her simvastatin. Additionally, I am having her maintain her diclofenac, aspirin, Fluticasone-Salmeterol, ipratropium-albuterol, and umeclidinium bromide.   Meds ordered this encounter   Medications  . DISCONTD: simvastatin (ZOCOR) 40 MG tablet    Sig: Take 40 mg by mouth daily.  . simvastatin (ZOCOR) 40 MG tablet    Sig: Take 1 tablet (40 mg total) by mouth daily.    Dispense:  30 tablet    Refill:  0    Order Specific Question:   Supervising Provider    Answer:   Pricilla Holm A [id number]     Follow-up: Return in about 1 month (around 02/25/2016), or if symptoms worsen or fail to improve.  Mauricio Po, FNP

## 2016-01-25 NOTE — Patient Instructions (Addendum)
Thank you for choosing [facility].  SUMMARY AND INSTRUCTIONS:  Please schedule a time for your physical at your convenience.   Medication:  Please continue to take your medication as prescribed.   Your prescription(s) have been submitted to your pharmacy or been printed and provided for you. Please take as directed and contact our office if you believe you are having problem(s) with the medication(s) or have any questions.  Labs:  Please stop by the lab on the lower level of the building for your blood work. Your results will be released to [place] (or called to you) after review, usually within 72 hours after test completion. If any changes need to be made, you will be notified at that same time.  1.) The lab is open from 7:30am to 5:30 pm Monday-Friday 2.) No appointment is necessary 3.) Fasting (if needed) is 6-8 hours after food and drink; black coffee and water are okay   Follow up:  If your symptoms worsen or fail to improve, please contact our office for further instruction, or in case of emergency go directly to the emergency room at the closest medical facility.

## 2016-01-25 NOTE — Assessment & Plan Note (Signed)
Previously diagnosed with 2 strokes in the past with risk factors including hyperlipidemia. Blood pressure appears adequately controlled. BMI does indicate obesity. Continue to monitor stroke risk factors and medication for modification to prevent further stroke in the future.

## 2016-01-25 NOTE — Assessment & Plan Note (Signed)
Continues to smoke approximately 8 cigarettes per day and expresses difficulty with managing stress which is a barrier to her tobacco cessation program. Not ready to quit smoking at this time. Continue to monitor.

## 2016-01-25 NOTE — Assessment & Plan Note (Signed)
Previously diagnosed with hyperlipidemia and maintained on simvastatin. Obtain lipid profile to check current status. Continue current dosage of simvastatin pending lab work results. Continue to monitor.

## 2016-02-04 ENCOUNTER — Telehealth: Payer: Self-pay | Admitting: *Deleted

## 2016-02-04 NOTE — Telephone Encounter (Signed)
Pt called in for her medical records. She says that she brought records in with her on 01/25/16 and was told by provider that they would make copies and mail her the original copy back to her at her home mailing address. Pt would like to be advised on where and when she can expect her records?    CB: [phone]

## 2016-02-05 NOTE — Telephone Encounter (Signed)
Do you have medical records for pt?

## 2016-02-06 NOTE — Telephone Encounter (Signed)
Medical records have been sent in the mail. Pt aware

## 2016-02-15 DIAGNOSIS — J449 Chronic obstructive pulmonary disease, unspecified: Secondary | ICD-10-CM | POA: Diagnosis not present

## 2016-02-19 NOTE — Progress Notes (Signed)
Subjective:   Rachel Wang is a 66 y.o. female who presents for an Initial Medicare Annual Wellness Visit.  Review of Systems    No ROS.  Medicare Wellness Visit.  Cardiac Risk Factors include: advanced age (>46mn, >>107women);dyslipidemia;hypertension;obesity (BMI >30kg/m2) Sleep patterns: Does not sleep well. States husband has dementia and wakes her in the middle of the night because he gets up and wanders around.  Home Safety/Smoke Alarms: Feels safe in home. Smoke alarms in place.    Living environment; residence and Firearm Safety: Lives with husband on 2nd floor apt. Seat Belt Safety/Bike Helmet: Wears seat belt.   Counseling:   Eye Exam- Pt states she will schedule eye appt. Dental- Upper and lower dentures. Gum care discussed.   Female:   [place]- pt declines  [place]. Will request records.     Dexa scan- Last 2016 per pt. Requested records.      CCS- Never and pt declines.      Objective:    Today's Vitals   02/20/16 1350  BP: 124/76  Pulse: 90  SpO2: 96%  Weight: 186 lb (84.4 kg)  Height: _0  (1.651 m)   Body mass index is 30.95 kg/m.   Current Medications (verified) Outpatient Encounter Prescriptions as of 02/20/2016  Medication Sig  . aspirin 81 MG chewable tablet Chew 81 mg by mouth daily.  . Fluticasone-Salmeterol (ADVAIR DISKUS) 250-50 MCG/DOSE AEPB Inhale 1 puff into the lungs 2 (two) times daily.  .Marland Kitchenipratropium-albuterol (DUONEB) 0.5-2.5 (3) MG/3ML SOLN Take 3 mLs by nebulization every 6 (six) hours as needed.  . simvastatin (ZOCOR) 40 MG tablet Take 1 tablet (40 mg total) by mouth daily.  .Marland Kitchenumeclidinium bromide (INCRUSE ELLIPTA) 62.5 MCG/INH AEPB Inhale 1 puff into the lungs daily.  . diclofenac (VOLTAREN) 75 MG EC tablet Take 75 mg by mouth 2 (two) times daily.   No facility-administered encounter medications on file as of 02/20/2016.     Allergies  (verified) Levofloxacin and Tramadol   History: Past Medical History:  Diagnosis Date  . Bronchitis   . COPD (chronic obstructive pulmonary disease) ([place]   . [place] lung ([place]   . Hyperlipidemia   . Stroke ([facility]    History reviewed. No pertinent surgical history. Family History  Problem Relation Age of Onset  . HIV Father   . Lung cancer Sister   . Lung cancer Paternal Aunt   . Stroke Maternal Grandmother   . Heart attack Maternal Grandfather   . Stroke Paternal Grandmother   . Hypertension Paternal Grandmother   . Heart attack Paternal Grandfather    Social History   Occupational History  . Retired    Social History Main Topics  . Smoking status: Current Every Day Smoker    Years: 20.00    Types: Cigarettes  . Smokeless tobacco: Never Used     Comment: smokes 10  cig.s a day  . Alcohol use No  . Drug use: No  . Sexual activity: Not on file    Tobacco Counseling Ready to quit: Not Answered Counseling given: No   Activities of Daily Living In your present state of health, do you have any difficulty performing the following activities: 02/20/2016 01/25/2016  Hearing? N N  Vision? Y N  Difficulty concentrating or making decisions? N N  Walking or climbing stairs? Y N  Dressing or bathing? N N  Doing errands, shopping? N  N  Preparing Food and eating ? N -  Using the Toilet? N -  In the past six months, have you accidently leaked urine? N -  Do you have problems with loss of bowel control? N -  Managing your Medications? N -  Managing your Finances? N -  Housekeeping or managing your Housekeeping? N -    Immunizations and Health Maintenance  There is no immunization history on file for this patient. Health Maintenance Due  Topic Date Due  . Hepatitis C Screening  [birth year]  . HIV Screening  11/12/1965  . TETANUS/TDAP  11/12/1969  . PAP SMEAR  11/13/1971  . MAMMOGRAM  11/12/2000  . COLONOSCOPY  11/12/2000  . ZOSTAVAX  11/13/2010  . INFLUENZA  VACCINE  08/28/2015  . DEXA SCAN  11/13/2015  . PNA vac Low Risk Adult (1 of 2 - PCV13) 11/13/2015    Patient Care Team: Golden Circle, FNP as PCP - General (Family Medicine)  Indicate any recent Medical Services you may have received from other than [facility] providers in the past year (date may be approximate).     Assessment:   This is a routine wellness examination for Rachel Wang. Physical assessment deferred to PCP.   Hearing/Vision screen  Visual Acuity Screening   Right eye Left eye Both eyes  Without correction: _0  With correction:     Hearing Screening Comments: Able to hear conversational tones w/o difficulty. No issues reported.    Dietary issues and exercise activities discussed:   Diet (meal preparation, eat out, water intake, caffeinated beverages, dairy products, fruits and vegetables):  24 HOUR RECALL Breakfast: Raisin bread with cream cheese. Lunch: None Dinner: mac and cheese, string beans, pork chops Drinks 4 bottles of water per day.  Goals    . To get some help with husband.      Depression Screen PHQ 2/9 Scores 02/20/2016 01/25/2016  PHQ - 2 Score 0 0    Fall Risk Fall Risk  02/20/2016 01/25/2016  Falls in the past year? No No    Cognitive Function: MMSE - Mini Mental State Exam 02/20/2016  Orientation to time 5  Orientation to Place 5  Registration 3  Attention/ Calculation 5  Recall 2  Language- name 2 objects 2  Language- repeat 1  Language- follow 3 step command 3  Language- read & follow direction 1  Write a sentence 1  Copy design 1  Total score 29        Screening Tests Health Maintenance  Topic Date Due  . Hepatitis C Screening  12-24-1950  . HIV Screening  11/12/1965  . TETANUS/TDAP  11/12/1969  . PAP SMEAR  11/13/1971  . MAMMOGRAM  11/12/2000  . COLONOSCOPY  11/12/2000  . ZOSTAVAX  11/13/2010  . INFLUENZA VACCINE  08/28/2015  . DEXA SCAN  11/13/2015  . PNA vac Low Risk Adult (1 of 2 - PCV13) 11/13/2015       Plan:     Continue to eat heart healthy diet (full of fruits, vegetables, whole grains, lean protein, water--limit salt, fat, and sugar intake) and increase physical activity as tolerated.  Continue doing brain stimulating activities (puzzles, reading, adult coloring books, staying active) to keep memory sharp.    During the course of the visit, Lowella Bandy was educated and counseled about the following appropriate screening and preventive services:   Vaccines to include Pneumoccal, Influenza, Hepatitis B, Td, Zostavax, HCV  Cardiovascular disease screening  Colorectal cancer screening  Bone density screening  Diabetes screening  Glaucoma screening  Mammography/PAP  Nutrition counseling  Smoking cessation counseling  Patient Instructions (the written plan) were given to the patient.    Shela Nevin, [place]   02/20/2016

## 2016-02-19 NOTE — Progress Notes (Signed)
Pre visit review using our clinic review tool, if applicable. No additional management support is needed unless otherwise documented below in the visit note. 

## 2016-02-20 ENCOUNTER — Ambulatory Visit (INDEPENDENT_AMBULATORY_CARE_PROVIDER_SITE_OTHER): Payer: Medicare HMO | Admitting: *Deleted

## 2016-02-20 VITALS — BP 124/76 | HR 90 | Ht 65.0 in | Wt 186.0 lb

## 2016-02-20 DIAGNOSIS — Z0000 Encounter for general adult medical examination without abnormal findings: Secondary | ICD-10-CM | POA: Diagnosis not present

## 2016-02-20 NOTE — Patient Instructions (Addendum)
Continue to eat heart healthy diet (full of fruits, vegetables, whole grains, lean protein, water--limit salt, fat, and sugar intake) and increase physical activity as tolerated.  Continue doing brain stimulating activities (puzzles, reading, adult coloring books, staying active) to keep memory sharp.   Health Maintenance, Female Introduction Adopting a healthy lifestyle and getting preventive care can go a long way to promote health and wellness. Talk with your health care provider about what schedule of regular examinations is right for you. This is a good chance for you to check in with your provider about disease prevention and staying healthy. In between checkups, there are plenty of things you can do on your own. Experts have done a lot of research about which lifestyle changes and preventive measures are most likely to keep you healthy. Ask your health care provider for more information. Weight and diet Eat a healthy diet  Be sure to include plenty of vegetables, fruits, low-fat dairy products, and lean protein.  Do not eat a lot of foods high in solid fats, added sugars, or salt.  Get regular exercise. This is one of the most important things you can do for your health.  Most adults should exercise for at least 150 minutes each week. The exercise should increase your heart rate and make you sweat (moderate-intensity exercise).  Most adults should also do strengthening exercises at least twice a week. This is in addition to the moderate-intensity exercise. Maintain a healthy weight  Body mass index (BMI) is a measurement that can be used to identify possible weight problems. It estimates body fat based on height and weight. Your health care provider can help determine your BMI and help you achieve or maintain a healthy weight.  For females 56 years of age and older:  A BMI below 18.5 is considered underweight.  A BMI of 18.5 to 24.9 is normal.  A BMI of 25 to 29.9 is considered  overweight.  A BMI of 30 and above is considered obese. Watch levels of cholesterol and blood lipids  You should start having your blood tested for lipids and cholesterol at 66 years of age, then have this test every 5 years.  You may need to have your cholesterol levels checked more often if:  Your lipid or cholesterol levels are high.  You are older than 66 years of age.  You are at high risk for heart disease. Cancer screening Lung Cancer  Lung cancer screening is recommended for adults 88-48 years old who are at high risk for lung cancer because of a history of smoking.  A yearly low-dose CT scan of the lungs is recommended for people who:  Currently smoke.  Have quit within the past 15 years.  Have at least a 30-pack-year history of smoking. A pack year is smoking an average of one pack of cigarettes a day for 1 year.  Yearly screening should continue until it has been 15 years since you quit.  Yearly screening should stop if you develop a health problem that would prevent you from having lung cancer treatment. Breast Cancer  Practice breast self-awareness. This means understanding how your breasts normally appear and feel.  It also means doing regular breast self-exams. Let your health care provider know about any changes, no matter how small.  If you are in your 20s or 30s, you should have a clinical breast exam (CBE) by a health care provider every 1-3 years as part of a regular health exam.  If you are 40  or older, have a CBE every year. Also consider having a breast X-ray (mammogram) every year.  If you have a family history of breast cancer, talk to your health care provider about genetic screening.  If you are at high risk for breast cancer, talk to your health care provider about having an MRI and a mammogram every year.  Breast cancer gene (BRCA) assessment is recommended for women who have family members with BRCA-related cancers. BRCA-related cancers  include:  Breast.  Ovarian.  Tubal.  Peritoneal cancers.  Results of the assessment will determine the need for genetic counseling and BRCA1 and BRCA2 testing. Cervical Cancer  Your health care provider may recommend that you be screened regularly for cancer of the pelvic organs (ovaries, uterus, and vagina). This screening involves a pelvic examination, including checking for microscopic changes to the surface of your cervix (Pap test). You may be encouraged to have this screening done every 3 years, beginning at age 36.  For women ages 33-65, health care providers may recommend pelvic exams and Pap testing every 3 years, or they may recommend the Pap and pelvic exam, combined with testing for human papilloma virus (HPV), every 5 years. Some types of HPV increase your risk of cervical cancer. Testing for HPV may also be done on women of any age with unclear Pap test results.  Other health care providers may not recommend any screening for nonpregnant women who are considered low risk for pelvic cancer and who do not have symptoms. Ask your health care provider if a screening pelvic exam is right for you.  If you have had past treatment for cervical cancer or a condition that could lead to cancer, you need Pap tests and screening for cancer for at least 20 years after your treatment. If Pap tests have been discontinued, your risk factors (such as having a new sexual partner) need to be reassessed to determine if screening should resume. Some women have medical problems that increase the chance of getting cervical cancer. In these cases, your health care provider may recommend more frequent screening and Pap tests. Colorectal Cancer  This type of cancer can be detected and often prevented.  Routine colorectal cancer screening usually begins at 65 years of age and continues through 66 years of age.  Your health care provider may recommend screening at an earlier age if you have risk factors  for colon cancer.  Your health care provider may also recommend using home test kits to check for hidden blood in the stool.  A small camera at the end of a tube can be used to examine your colon directly (sigmoidoscopy or colonoscopy). This is done to check for the earliest forms of colorectal cancer.  Routine screening usually begins at age 27.  Direct examination of the colon should be repeated every 5-10 years through 66 years of age. However, you may need to be screened more often if early forms of precancerous polyps or small growths are found. Skin Cancer  Check your skin from head to toe regularly.  Tell your health care provider about any new moles or changes in moles, especially if there is a change in a mole's shape or color.  Also tell your health care provider if you have a mole that is larger than the size of a pencil eraser.  Always use sunscreen. Apply sunscreen liberally and repeatedly throughout the day.  Protect yourself by wearing long sleeves, pants, a wide-brimmed hat, and sunglasses whenever you are outside. Heart  disease, diabetes, and high blood pressure  High blood pressure causes heart disease and increases the risk of stroke. High blood pressure is more likely to develop in:  People who have blood pressure in the high end of the normal range (130-139/85-89 mm Hg).  People who are overweight or obese.  People who are African American.  If you are 35-89 years of age, have your blood pressure checked every 3-5 years. If you are 62 years of age or older, have your blood pressure checked every year. You should have your blood pressure measured twice-once when you are at a hospital or clinic, and once when you are not at a hospital or clinic. Record the average of the two measurements. To check your blood pressure when you are not at a hospital or clinic, you can use:  An automated blood pressure machine at a pharmacy.  A home blood pressure monitor.  If you  are between 71 years and 61 years old, ask your health care provider if you should take aspirin to prevent strokes.  Have regular diabetes screenings. This involves taking a blood sample to check your fasting blood sugar level.  If you are at a normal weight and have a low risk for diabetes, have this test once every three years after 66 years of age.  If you are overweight and have a high risk for diabetes, consider being tested at a younger age or more often. Preventing infection Hepatitis B  If you have a higher risk for hepatitis B, you should be screened for this virus. You are considered at high risk for hepatitis B if:  You were born in a country where hepatitis B is common. Ask your health care provider which countries are considered high risk.  Your parents were born in a high-risk country, and you have not been immunized against hepatitis B (hepatitis B vaccine).  You have HIV or AIDS.  You use needles to inject street drugs.  You live with someone who has hepatitis B.  You have had sex with someone who has hepatitis B.  You get hemodialysis treatment.  You take certain medicines for conditions, including cancer, organ transplantation, and autoimmune conditions. Hepatitis C  Blood testing is recommended for:  Everyone born from [birth year] through 1965.  Anyone with known risk factors for hepatitis C. Sexually transmitted infections (STIs)  You should be screened for sexually transmitted infections (STIs) including gonorrhea and chlamydia if:  You are sexually active and are younger than 66 years of age.  You are older than 66 years of age and your health care provider tells you that you are at risk for this type of infection.  Your sexual activity has changed since you were last screened and you are at an increased risk for chlamydia or gonorrhea. Ask your health care provider if you are at risk.  If you do not have HIV, but are at risk, it may be recommended that you  take a prescription medicine daily to prevent HIV infection. This is called pre-exposure prophylaxis (PrEP). You are considered at risk if:  You are sexually active and do not regularly use condoms or know the HIV status of your partner(s).  You take drugs by injection.  You are sexually active with a partner who has HIV. Talk with your health care provider about whether you are at high risk of being infected with HIV. If you choose to begin PrEP, you should first be tested for HIV. You should then be  tested every 3 months for as long as you are taking PrEP. Pregnancy  If you are premenopausal and you may become pregnant, ask your health care provider about preconception counseling.  If you may become pregnant, take 400 to 800 micrograms (mcg) of folic acid every day.  If you want to prevent pregnancy, talk to your health care provider about birth control (contraception). Osteoporosis and menopause  Osteoporosis is a disease in which the bones lose minerals and strength with aging. This can result in serious bone fractures. Your risk for osteoporosis can be identified using a bone density scan.  If you are 79 years of age or older, or if you are at risk for osteoporosis and fractures, ask your health care provider if you should be screened.  Ask your health care provider whether you should take a calcium or vitamin D supplement to lower your risk for osteoporosis.  Menopause may have certain physical symptoms and risks.  Hormone replacement therapy may reduce some of these symptoms and risks. Talk to your health care provider about whether hormone replacement therapy is right for you. Follow these instructions at home:  Schedule regular health, dental, and eye exams.  Stay current with your immunizations.  Do not use any tobacco products including cigarettes, chewing tobacco, or electronic cigarettes.  If you are pregnant, do not drink alcohol.  If you are breastfeeding, limit  how much and how often you drink alcohol.  Limit alcohol intake to no more than 1 drink per day for nonpregnant women. One drink equals 12 ounces of beer, 5 ounces of wine, or 1 ounces of hard liquor.  Do not use street drugs.  Do not share needles.  Ask your health care provider for help if you need support or information about quitting drugs.  Tell your health care provider if you often feel depressed.  Tell your health care provider if you have ever been abused or do not feel safe at home. This information is not intended to replace advice given to you by your health care provider. Make sure you discuss any questions you have with your health care provider. Document Released: 07/29/2010 Document Revised: 06/21/2015 Document Reviewed: 10/17/2014  2017 Elsevier   Steps to Quit Smoking Smoking tobacco can be harmful to your health and can affect almost every organ in your body. Smoking puts you, and those around you, at risk for developing many serious chronic diseases. Quitting smoking is difficult, but it is one of the best things that you can do for your health. It is never too late to quit. What are the benefits of quitting smoking? When you quit smoking, you lower your risk of developing serious diseases and conditions, such as:  Lung cancer or lung disease, such as COPD.  Heart disease.  Stroke.  Heart attack.  Infertility.  Osteoporosis and bone fractures. Additionally, symptoms such as coughing, wheezing, and shortness of breath may get better when you quit. You may also find that you get sick less often because your body is stronger at fighting off colds and infections. If you are pregnant, quitting smoking can help to reduce your chances of having a baby of low birth weight. How do I get ready to quit? When you decide to quit smoking, create a plan to make sure that you are successful. Before you quit:  Pick a date to quit. Set a date within the next two weeks to  give you time to prepare.  Write down the reasons why you  are quitting. Keep this list in places where you will see it often, such as on your bathroom mirror or in your car or wallet.  Identify the people, places, things, and activities that make you want to smoke (triggers) and avoid them. Make sure to take these actions:  Throw away all cigarettes at home, at work, and in your car.  Throw away smoking accessories, such as Scientist, research (medical).  Clean your car and make sure to empty the ashtray.  Clean your home, including curtains and carpets.  Tell your family, friends, and coworkers that you are quitting. Support from your loved ones can make quitting easier.  Talk with your health care provider about your options for quitting smoking.  Find out what treatment options are covered by your health insurance. What strategies can I use to quit smoking? Talk with your healthcare provider about different strategies to quit smoking. Some strategies include:  Quitting smoking altogether instead of gradually lessening how much you smoke over a period of time. Research shows that quitting "cold [place]" is more successful than gradually quitting.  Attending in-person counseling to help you build problem-solving skills. You are more likely to have success in quitting if you attend several counseling sessions. Even short sessions of 10 minutes can be effective.  Finding resources and support systems that can help you to quit smoking and remain smoke-free after you quit. These resources are most helpful when you use them often. They can include:  Online chats with a Social worker.  Telephone quitlines.  Printed Furniture conservator/restorer.  Support groups or group counseling.  Text messaging programs.  Mobile phone applications.  Taking medicines to help you quit smoking. (If you are pregnant or breastfeeding, talk with your health care provider first.) Some medicines contain nicotine and some do  not. Both types of medicines help with cravings, but the medicines that include nicotine help to relieve withdrawal symptoms. Your health care provider may recommend:  Nicotine patches, gum, or lozenges.  Nicotine inhalers or sprays.  Non-nicotine medicine that is taken by mouth. Talk with your health care provider about combining strategies, such as taking medicines while you are also receiving in-person counseling. Using these two strategies together makes you more likely to succeed in quitting than if you used either strategy on its own. If you are pregnant or breastfeeding, talk with your health care provider about finding counseling or other support strategies to quit smoking. Do not take medicine to help you quit smoking unless told to do so by your health care provider. What things can I do to make it easier to quit? Quitting smoking might feel overwhelming at first, but there is a lot that you can do to make it easier. Take these important actions:  Reach out to your family and friends and ask that they support and encourage you during this time. Call telephone quitlines, reach out to support groups, or work with a counselor for support.  Ask people who smoke to avoid smoking around you.  Avoid places that trigger you to smoke, such as bars, parties, or smoke-break areas at work.  Spend time around people who do not smoke.  Lessen stress in your life, because stress can be a smoking trigger for some people. To lessen stress, try:  Exercising regularly.  Deep-breathing exercises.  Yoga.  Meditating.  Performing a body scan. This involves closing your eyes, scanning your body from head to toe, and noticing which parts of your body are particularly tense. Purposefully relax  the muscles in those areas.  Download or purchase mobile phone or tablet apps (applications) that can help you stick to your quit plan by providing reminders, tips, and encouragement. There are many free apps,  such as QuitGuide from the [employer] Office manager for Disease Control and Prevention). You can find other support for quitting smoking (smoking cessation) through smokefree.gov and other websites. How will I feel when I quit smoking? Within the first 24 hours of quitting smoking, you may start to feel some withdrawal symptoms. These symptoms are usually most noticeable 2-3 days after quitting, but they usually do not last beyond 2-3 weeks. Changes or symptoms that you might experience include:  Mood swings.  Restlessness, anxiety, or irritation.  Difficulty concentrating.  Dizziness.  Strong cravings for sugary foods in addition to nicotine.  Mild weight gain.  Constipation.  Nausea.  Coughing or a sore throat.  Changes in how your medicines work in your body.  A depressed mood.  Difficulty sleeping (insomnia). After the first 2-3 weeks of quitting, you may start to notice more positive results, such as:  Improved sense of smell and taste.  Decreased coughing and sore throat.  Slower heart rate.  Lower blood pressure.  Clearer skin.  The ability to breathe more easily.  Fewer sick days. Quitting smoking is very challenging for most people. Do not get discouraged if you are not successful the first time. Some people need to make many attempts to quit before they achieve long-term success. Do your best to stick to your quit plan, and talk with your health care provider if you have any questions or concerns. This information is not intended to replace advice given to you by your health care provider. Make sure you discuss any questions you have with your health care provider. Document Released: 01/07/2001 Document Revised: 09/11/2015 Document Reviewed: 05/30/2014 Elsevier Interactive Patient Education  2017 [employer].   Steps to Quit Smoking Smoking tobacco can be bad for your health. It can also affect almost every organ in your body. Smoking puts you and people around you at  risk for many serious long-lasting (chronic) diseases. Quitting smoking is hard, but it is one of the best things that you can do for your health. It is never too late to quit. What are the benefits of quitting smoking? When you quit smoking, you lower your risk for getting serious diseases and conditions. They can include:  Lung cancer or lung disease.  Heart disease.  Stroke.  Heart attack.  Not being able to have children (infertility).  Weak bones (osteoporosis) and broken bones (fractures). If you have coughing, wheezing, and shortness of breath, those symptoms may get better when you quit. You may also get sick less often. If you are pregnant, quitting smoking can help to lower your chances of having a baby of low birth weight. What can I do to help me quit smoking? Talk with your doctor about what can help you quit smoking. Some things you can do (strategies) include:  Quitting smoking totally, instead of slowly cutting back how much you smoke over a period of time.  Going to in-person counseling. You are more likely to quit if you go to many counseling sessions.  Using resources and support systems, such as:  Online chats with a Social worker.  Phone quitlines.  Printed Furniture conservator/restorer.  Support groups or group counseling.  Text messaging programs.  Mobile phone apps or applications.  Taking medicines. Some of these medicines may have nicotine in them.  If you are pregnant or breastfeeding, do not take any medicines to quit smoking unless your doctor says it is okay. Talk with your doctor about counseling or other things that can help you. Talk with your doctor about using more than one strategy at the same time, such as taking medicines while you are also going to in-person counseling. This can help make quitting easier. What things can I do to make it easier to quit? Quitting smoking might feel very hard at first, but there is a lot that you can do to make it easier.  Take these steps:  Talk to your family and friends. Ask them to support and encourage you.  Call phone quitlines, reach out to support groups, or work with a Social worker.  Ask people who smoke to not smoke around you.  Avoid places that make you want (trigger) to smoke, such as:  Bars.  Parties.  Smoke-break areas at work.  Spend time with people who do not smoke.  Lower the stress in your life. Stress can make you want to smoke. Try these things to help your stress:  Getting regular exercise.  Deep-breathing exercises.  Yoga.  Meditating.  Doing a body scan. To do this, close your eyes, focus on one area of your body at a time from head to toe, and notice which parts of your body are tense. Try to relax the muscles in those areas.  Download or buy apps on your mobile phone or tablet that can help you stick to your quit plan. There are many free apps, such as QuitGuide from the [employer] Office manager for Disease Control and Prevention). You can find more support from smokefree.gov and other websites. This information is not intended to replace advice given to you by your health care provider. Make sure you discuss any questions you have with your health care provider. Document Released: 11/09/2008 Document Revised: 09/11/2015 Document Reviewed: 05/30/2014 Elsevier Interactive Patient Education  2017 [employer].

## 2016-02-26 NOTE — Progress Notes (Signed)
Medical screening examination/treatment/procedure(s) were performed by non-physician practitioner and as supervising provider I was immediately available for consultation/collaboration. I agree with treatment plan. Kanchan Gal, FNP   

## 2016-03-17 DIAGNOSIS — J449 Chronic obstructive pulmonary disease, unspecified: Secondary | ICD-10-CM | POA: Diagnosis not present

## 2016-05-12 ENCOUNTER — Ambulatory Visit (INDEPENDENT_AMBULATORY_CARE_PROVIDER_SITE_OTHER): Payer: Medicare HMO | Admitting: Internal Medicine

## 2016-05-12 ENCOUNTER — Encounter: Payer: Self-pay | Admitting: Internal Medicine

## 2016-05-12 DIAGNOSIS — J441 Chronic obstructive pulmonary disease with (acute) exacerbation: Secondary | ICD-10-CM | POA: Diagnosis not present

## 2016-05-12 DIAGNOSIS — J449 Chronic obstructive pulmonary disease, unspecified: Secondary | ICD-10-CM | POA: Diagnosis not present

## 2016-05-12 DIAGNOSIS — R69 Illness, unspecified: Secondary | ICD-10-CM | POA: Diagnosis not present

## 2016-05-12 DIAGNOSIS — F17200 Nicotine dependence, unspecified, uncomplicated: Secondary | ICD-10-CM

## 2016-05-12 MED ORDER — PREDNISONE 10 MG PO TABS: ORAL_TABLET | ORAL | 0 refills | Status: DC

## 2016-05-12 MED ORDER — CEPHALEXIN 500 MG PO CAPS: 500.0000 mg | ORAL_CAPSULE | Freq: Three times a day (TID) | ORAL | 0 refills | Status: DC

## 2016-05-12 MED ORDER — FLUTICASONE FUROATE-VILANTEROL 100-25 MCG/INH IN AEPB: 1.0000 | INHALATION_SPRAY | Freq: Every day | RESPIRATORY_TRACT | 0 refills | Status: DC

## 2016-05-12 NOTE — Progress Notes (Signed)
Subjective:     Patient ID: Rachel Wang, female   DOB: May 08, 1950, 66 y.o.   MRN: [id number]  HPI   IOV 11/09/2015  Chief Complaint  Patient presents with  . Pulmonary Consult    Self Referral, Pt. c/o recently moved here, having to use her oxygen more, has a portable tank as well as one at home, SOB all the time, been out of her inhalers since wed., prior to running out she was using them alot, coughing with phelgm,      66 year old African-American female. Recently moved from [place] to [place], [place] in the last few months after recently being married. She reports a long-standing history of COPD. She does not know her severe anemia but she says it is quite severe. At baseline she is on oxygen with exertion and triple inhaler therapy with Spiriva and Advair. She used to be taken care of her pulmonologist at [facility]. Also record showed alpha-1 MM. She has a sister with COPD on oxygen. Her father died from AIDS. She tells me that overall she's been stable except that in January 2017 she had an admission for pneumonia and COPD exacerbation. She then had another admission for the same in March 2017. Around this time she did have a CT chest for her history that showed clearance of the pneumonia. The only report I have is a CT chest from January 2017. I do not have the images for visualization with the reports a severe emphysema and patchy bilateral airspace disease compatible with pneumonia. She says when she moved here she was stable she had her oxygen but she's run out of her inhalers. The last few weeks a COPD has had exacerbation due to running out of inhalers and also the change in weather. She is more short of breath, more wheezing. Earlier this week she nearly passed out when she was off oxygen while shopping at [place].  Unfortunately she still smoking    12/28/2015 Follow up : COPD /PFT/CT results.  Pt returns for 6 weeks follow up .  She was seen for pulmonary consult to  establish for COPD. She recently moved here from [place]. She is on Advair and Spiriva. Uses oxygen with act As needed  And At bedtime   And tx for COPD exacerbation with Doxycycline and prednisone taper.  She is feeling better. Does have some daily cough and gets winded with activity .  She was set up for CT and PFT which we revewied today .  CT chest 12/04/15 showed emphysematous changes and pulmonary scarring w/ no acute process.  Did show age advance 3 vessel coronary artery califications.  PFT 12/28/15 shows FEV1 63%, ratio 78, FVC 63%, DLCO 37, TLC 67%.  Spiriva no longer covered. We discussed alternatives that are covered by her insurance as  She continues to smoking , advised on cessation.  Declines flu shot .   OV 05/12/2016  Chief Complaint  Patient presents with  . Follow-up    Pt c/o prod cough with yellow to green mucus, increase in SOB, midsternal CP, night sweats x 1.5 weeks.    Follow-up moderate COPD follow pulmonary function test  I first saw her in October 2017. She transferred her care from [facility]. At that time she was an active smoker on triple inhaler therapy. She followed up with my nurse practitioner December 2017. Pulmonary function test on triple inhaler therapy showed moderate COPD. She was then scheduled down to single agent long-acting anticholinergic. She says she's been  taking this diligently without any fail. The last 2 weeks he's had increased symptoms of shortness of breath, cough, wheezing that is rated as severe. In addition sputum volume is more than baseline and color changed to greenish. This. She does have coronary artery calcification CT chest. She tells me that she's had cardiac cath in [facility] and this was normal the last few years. She continues to smoke    has a past medical history of Bronchitis; COPD (chronic obstructive pulmonary disease) ([place]); Emphysema of lung ([place]); Hyperlipidemia; and Stroke ([place]).   reports that she has been smoking  Cigarettes.  She has a 15.00 pack-year smoking history. She has never used smokeless tobacco.  No past surgical history on file.  Allergies  Allergen Reactions  . Levofloxacin Nausea And Vomiting  . Tramadol Nausea And Vomiting     There is no immunization history on file for this patient.  Family History  Problem Relation Age of Onset  . HIV Father   . Lung cancer Sister   . Lung cancer Paternal Aunt   . Stroke Maternal Grandmother   . Heart attack Maternal Grandfather   . Stroke Paternal Grandmother   . Hypertension Paternal Grandmother   . Heart attack Paternal Grandfather      Current Outpatient Prescriptions:  .  aspirin 81 MG chewable tablet, Chew 81 mg by mouth daily., Disp: , Rfl:  .  ipratropium-albuterol (DUONEB) 0.5-2.5 (3) MG/3ML SOLN, Take 3 mLs by nebulization every 6 (six) hours as needed., Disp: , Rfl:  .  simvastatin (ZOCOR) 40 MG tablet, Take 1 tablet (40 mg total) by mouth daily., Disp: 30 tablet, Rfl: 0 .  umeclidinium bromide (INCRUSE ELLIPTA) 62.5 MCG/INH AEPB, Inhale 1 puff into the lungs daily., Disp: 30 each, Rfl: 4 .  diclofenac (VOLTAREN) 75 MG EC tablet, Take 75 mg by mouth 2 (two) times daily., Disp: , Rfl:    Review of Systems     Objective:   Physical Exam  Constitutional: She is oriented to person, place, and time. She appears well-developed and well-nourished. No distress.  Looks a little bit tired and is coughing  HENT:  Head: Normocephalic and atraumatic.  Right Ear: External ear normal.  Left Ear: External ear normal.  Mouth/Throat: Oropharynx is clear and moist. No oropharyngeal exudate.  Eyes: Conjunctivae and EOM are normal. Pupils are equal, round, and reactive to light. Right eye exhibits no discharge. Left eye exhibits no discharge. No scleral icterus.  Neck: Normal range of motion. Neck supple. No JVD present. No tracheal deviation present. No thyromegaly present.  Cardiovascular: Normal rate, regular rhythm, normal heart  sounds and intact distal pulses.  Exam reveals no gallop and no friction rub.   No murmur heard. Pulmonary/Chest: Effort normal and breath sounds normal. No respiratory distress. She has no wheezes. She has no rales. She exhibits no tenderness.  Significant bilateral wheezing but does have normal work of breathing no accessory muscle use first of breathing  Abdominal: Soft. Bowel sounds are normal. She exhibits no distension and no mass. There is no tenderness. There is no rebound and no guarding.  Musculoskeletal: Normal range of motion. She exhibits no edema or tenderness.  Lymphadenopathy:    She has no cervical adenopathy.  Neurological: She is alert and oriented to person, place, and time. She has normal reflexes. No cranial nerve deficit. She exhibits normal muscle tone. Coordination normal.  Skin: Skin is warm and dry. No rash noted. She is not diaphoretic. No erythema. No pallor.  Psychiatric: She has a normal mood and affect. Her behavior is normal. Judgment and thought content normal.  Vitals reviewed.  Vitals:   05/12/16 1214  BP: 108/76  Pulse: (!) 104  Temp: 98.5 F (36.9 C)  TempSrc: Oral  SpO2: 94%  Weight: 179 lb 9.6 oz (81.5 kg)  Height: 5\' 5"  (1.651 m)   Pulse ox is 94% on room air Estimated body mass index is 29.89 kg/m as calculated from the following:   Height as of this encounter: 5\' 5"  (1.651 m).   Weight as of this encounter: 179 lb 9.6 oz (81.5 kg).     Assessment:       ICD-9-CM ICD-10-CM   1. COPD exacerbation ([place]) 491.21 J44.1   2. COPD, moderate ([place]) 496 J44.9   3. Current every day smoker 305.1 F17.200        Plan:     COPD exacerbation (HCC) Currently in copd flare up Possibly due to scale down of inhalers or recent weather change or virus  Plan Take prednisone 40 mg daily x 2 days, then 20mg  daily x 2 days, then 10mg  daily x 2 days, then 5mg  daily x 2 days and stop  Cephalexin 500mg  three times daily x 5 days  Xopnex in office  IM  depot medrol 80mg  x 1 in office  COPD, moderate ([place]) Having flare up with scaled down inhaler regimen  Plan Quit smoking Continue incruse Add breo low dose daily Use nebs as needed  followup 3 months or sooner if needed  Current every day smoker Quit smoking asap wll disucss in detail at next visit    Dr. Brand Males, M.D., [facility].C.P Pulmonary and Critical Care Medicine Staff Physician [place] [facility] Pager: [phone], If no answer or between  15:00h - 7:00h: call [phone]  05/12/2016 12:27 PM

## 2016-05-12 NOTE — Patient Instructions (Signed)
COPD exacerbation (HCC) Currently in copd flare up Possibly due to scale down of inhalers or recent weather change or virus  Plan Take prednisone 40 mg daily x 2 days, then 20mg  daily x 2 days, then 10mg  daily x 2 days, then 5mg  daily x 2 days and stop  Cephalexin 500mg  three times daily x 5 days  Xopnex in office  IM depot medrol 80mg  x 1 in office  COPD, moderate ([place]) Having flare up with scaled down inhaler regimen  Plan Quit smoking Continue incruse Add breo low dose daily Use nebs as needed  followup 3 months or sooner if needed  Current every day smoker Quit smoking asap wll disucss in detail at next visit    followup 3 months or sooner if neeed

## 2016-05-12 NOTE — Assessment & Plan Note (Signed)
Having flare up with scaled down inhaler regimen  Plan Quit smoking Continue incruse Add breo low dose daily Use nebs as needed  followup 3 months or sooner if needed

## 2016-05-12 NOTE — Assessment & Plan Note (Signed)
Currently in copd flare up Possibly due to scale down of inhalers or recent weather change or virus  Plan Take prednisone 40 mg daily x 2 days, then 20mg  daily x 2 days, then 10mg  daily x 2 days, then 5mg  daily x 2 days and stop  Cephalexin 500mg  three times daily x 5 days  Xopnex in office  IM depot medrol 80mg  x 1 in office

## 2016-05-12 NOTE — Addendum Note (Signed)
Addended by: Rosana Berger on: 05/12/2016 12:37 PM   Modules accepted: Orders

## 2016-05-12 NOTE — Assessment & Plan Note (Signed)
Quit smoking asap wll disucss in detail at next visit

## 2016-05-12 NOTE — Addendum Note (Signed)
Addended by: Rosana Berger on: 05/12/2016 12:45 PM   Modules accepted: Orders

## 2016-05-13 ENCOUNTER — Telehealth: Payer: Self-pay | Admitting: Internal Medicine

## 2016-05-13 NOTE — Telephone Encounter (Signed)
Patient returned phone call..ert ° °

## 2016-05-13 NOTE — Telephone Encounter (Signed)
lmomtcb x1 

## 2016-05-13 NOTE — Telephone Encounter (Signed)
Pt is requesting a Rx for Proair be sent to [facility] Please advise Dr Chase Caller on instructions for use >> 2 puffs every 4 hours or 6 hours? Thanks.

## 2016-05-14 MED ORDER — ALBUTEROL SULFATE HFA 108 (90 BASE) MCG/ACT IN AERS: 2.0000 | INHALATION_SPRAY | RESPIRATORY_TRACT | 3 refills | Status: DC | PRN

## 2016-05-14 NOTE — Telephone Encounter (Signed)
Rx was sent  Spoke with the pt and notified that this was done  Nothing further needed

## 2016-05-14 NOTE — Telephone Encounter (Signed)
2 puff every 4-6h as needed for rescue; pro-air dosage  Dr. Brand Males, M.D., [facility].C.P Pulmonary and Critical Care Medicine Staff Physician [place] [facility] Critical [facility] Pager: [phone], If no answer or between  15:00h - 7:00h: call [phone]  05/14/2016 8:24 AM

## 2016-05-16 ENCOUNTER — Telehealth: Payer: Self-pay | Admitting: Internal Medicine

## 2016-05-16 NOTE — Telephone Encounter (Signed)
lmomtcb x1 

## 2016-05-17 DIAGNOSIS — Z79899 Other long term (current) drug therapy: Secondary | ICD-10-CM | POA: Diagnosis not present

## 2016-05-17 DIAGNOSIS — R252 Cramp and spasm: Secondary | ICD-10-CM | POA: Diagnosis not present

## 2016-05-17 DIAGNOSIS — Z7982 Long term (current) use of aspirin: Secondary | ICD-10-CM | POA: Diagnosis not present

## 2016-05-17 DIAGNOSIS — R002 Palpitations: Secondary | ICD-10-CM | POA: Diagnosis not present

## 2016-05-17 DIAGNOSIS — Z0000 Encounter for general adult medical examination without abnormal findings: Secondary | ICD-10-CM | POA: Diagnosis not present

## 2016-05-17 DIAGNOSIS — Z8701 Personal history of pneumonia (recurrent): Secondary | ICD-10-CM | POA: Diagnosis not present

## 2016-05-17 DIAGNOSIS — J440 Chronic obstructive pulmonary disease with acute lower respiratory infection: Secondary | ICD-10-CM | POA: Diagnosis not present

## 2016-05-17 DIAGNOSIS — R233 Spontaneous ecchymoses: Secondary | ICD-10-CM | POA: Diagnosis not present

## 2016-05-17 DIAGNOSIS — H9313 Tinnitus, bilateral: Secondary | ICD-10-CM | POA: Diagnosis not present

## 2016-05-17 DIAGNOSIS — R69 Illness, unspecified: Secondary | ICD-10-CM | POA: Diagnosis not present

## 2016-05-17 DIAGNOSIS — Z9181 History of falling: Secondary | ICD-10-CM | POA: Diagnosis not present

## 2016-05-17 DIAGNOSIS — G4700 Insomnia, unspecified: Secondary | ICD-10-CM | POA: Diagnosis not present

## 2016-05-17 DIAGNOSIS — G3184 Mild cognitive impairment, so stated: Secondary | ICD-10-CM | POA: Diagnosis not present

## 2016-05-17 DIAGNOSIS — E785 Hyperlipidemia, unspecified: Secondary | ICD-10-CM | POA: Diagnosis not present

## 2016-05-17 DIAGNOSIS — Z7951 Long term (current) use of inhaled steroids: Secondary | ICD-10-CM | POA: Diagnosis not present

## 2016-05-19 MED ORDER — ALBUTEROL SULFATE HFA 108 (90 BASE) MCG/ACT IN AERS: 2.0000 | INHALATION_SPRAY | RESPIRATORY_TRACT | 2 refills | Status: DC | PRN

## 2016-05-19 NOTE — Telephone Encounter (Signed)
Spoke with pt, who states proair has a $71 co pay. I then spoke with [place], who states proair is not covered, however ventolin is a preferred medication. Per office protocol Rx for ventolin has been sent to preferred pharmacy. Pt aware and voiced her understanding. Nothing further needed.

## 2016-06-10 ENCOUNTER — Other Ambulatory Visit: Payer: Self-pay | Admitting: *Deleted

## 2016-06-10 ENCOUNTER — Telehealth: Payer: Self-pay | Admitting: Internal Medicine

## 2016-06-10 MED ORDER — SIMVASTATIN 40 MG PO TABS: 40.0000 mg | ORAL_TABLET | Freq: Every day | ORAL | 0 refills | Status: DC

## 2016-06-10 MED ORDER — UMECLIDINIUM BROMIDE 62.5 MCG/INH IN AEPB: 1.0000 | INHALATION_SPRAY | Freq: Every day | RESPIRATORY_TRACT | 6 refills | Status: DC

## 2016-06-10 NOTE — Telephone Encounter (Signed)
Spoke with pt, who is requesting Rx for Incruse and Zocor. Rx for Incruse has been sent to preferred pharmacy. Pt is aware to contact her PCP for refills on Zocor. Nothing further needed.

## 2016-06-10 NOTE — Telephone Encounter (Signed)
Pt left msg on triage stating she is needing on her cholesterol med. Called pt no answer LMOM at least visit Lipid labs was not done. Need to come back to have lipid check, but will send a 30 day to [facility] until she have done...Johny Chess

## 2016-07-16 ENCOUNTER — Encounter: Payer: Self-pay | Admitting: *Deleted

## 2016-07-16 ENCOUNTER — Telehealth: Payer: Self-pay | Admitting: Internal Medicine

## 2016-07-16 MED ORDER — PREDNISONE 10 MG PO TABS: ORAL_TABLET | ORAL | 0 refills | Status: DC

## 2016-07-16 MED ORDER — DOXYCYCLINE HYCLATE 100 MG PO TABS: ORAL_TABLET | ORAL | 0 refills | Status: DC

## 2016-07-16 NOTE — Telephone Encounter (Signed)
lmtcb x1 for pt. 

## 2016-07-16 NOTE — Telephone Encounter (Signed)
Pt is aware of MR's recommendations and voiced her understanding.  Rx has been sent to preferred pharmacy. Nothing further needed.  

## 2016-07-16 NOTE — Telephone Encounter (Signed)
Spoke with pt. States that she is having issues with her breathing. Reports cough, wheezing, chest tightness and wheezing. Cough is producing green mucus and is worse at night time. Denies fever, chills or sweats. Pt has not tried any OTC meds. Symptoms started 3-4 days ago. Would like MR's recommendations.  MR - please advise. Thanks.

## 2016-07-16 NOTE — Telephone Encounter (Signed)
Patient returned call, CB is [phone]. She was advised to stay by her phone.

## 2016-07-16 NOTE — Telephone Encounter (Signed)
AECOPD  Do  Take doxycycline 100mg  po twice daily x 5 days; take after meals and avoid sunlight  Please take prednisone 40 mg x1 day, then 30 mg x1 day, then 20 mg x1 day, then 10 mg x1 day, and then 5 mg x1 day and stop   Dr. Brand Males, M.D., [facility].C.P Pulmonary and Critical Care Medicine Staff Physician [place] [facility] and Critical [facility] Pager: [phone], If no answer or between  15:00h - 7:00h: call [phone]  07/16/2016 1:59 PM

## 2016-08-14 DIAGNOSIS — J449 Chronic obstructive pulmonary disease, unspecified: Secondary | ICD-10-CM | POA: Diagnosis not present

## 2016-08-19 ENCOUNTER — Encounter: Payer: Self-pay | Admitting: Internal Medicine

## 2016-08-19 ENCOUNTER — Ambulatory Visit (INDEPENDENT_AMBULATORY_CARE_PROVIDER_SITE_OTHER): Payer: Medicare HMO | Admitting: Internal Medicine

## 2016-08-19 VITALS — BP 96/62 | HR 107 | Ht 65.0 in | Wt 178.6 lb

## 2016-08-19 DIAGNOSIS — J441 Chronic obstructive pulmonary disease with (acute) exacerbation: Secondary | ICD-10-CM | POA: Diagnosis not present

## 2016-08-19 MED ORDER — TIOTROPIUM BROMIDE MONOHYDRATE 2.5 MCG/ACT IN AERS
2.0000 | INHALATION_SPRAY | Freq: Every day | RESPIRATORY_TRACT | 11 refills | Status: DC
Start: 2016-08-19 — End: 2017-07-18

## 2016-08-19 MED ORDER — MOMETASONE FURO-FORMOTEROL FUM 200-5 MCG/ACT IN AERO: 2.0000 | INHALATION_SPRAY | Freq: Two times a day (BID) | RESPIRATORY_TRACT | 11 refills | Status: DC

## 2016-08-19 MED ORDER — METHYLPREDNISOLONE ACETATE 80 MG/ML IJ SUSP
80.0000 mg | Freq: Once | INTRAMUSCULAR | Status: AC
  Administered 2016-08-19: 80 mg via INTRAMUSCULAR

## 2016-08-19 MED ORDER — CEPHALEXIN 500 MG PO CAPS: 500.0000 mg | ORAL_CAPSULE | Freq: Three times a day (TID) | ORAL | 0 refills | Status: DC

## 2016-08-19 MED ORDER — PREDNISONE 10 MG PO TABS: ORAL_TABLET | ORAL | 0 refills | Status: DC

## 2016-08-19 MED ORDER — LEVALBUTEROL HCL 0.63 MG/3ML IN NEBU
0.6300 mg | INHALATION_SOLUTION | Freq: Once | RESPIRATORY_TRACT | Status: AC
  Administered 2016-08-19: 0.63 mg via RESPIRATORY_TRACT

## 2016-08-19 NOTE — Addendum Note (Signed)
Addended by: Collier Salina on: 08/19/2016 01:27 PM   Modules accepted: Orders

## 2016-08-19 NOTE — Progress Notes (Signed)
Subjective:     Patient ID: Rachel Wang, female   DOB: Jul 04, 1950, 66 y.o.   MRN: [id number]  HPI    IOV 11/09/2015  Chief Complaint  Patient presents with  . Pulmonary Consult    Self Referral, Pt. c/o recently moved here, having to use her oxygen more, has a portable tank as well as one at home, SOB all the time, been out of her inhalers since wed., prior to running out she was using them alot, coughing with phelgm,      66 year old African-American female. Recently moved from [place] to [place], [place] in the last few months after recently being married. She reports a long-standing history of COPD. She does not know her severe anemia but she says it is quite severe. At baseline she is on oxygen with exertion and triple inhaler therapy with Spiriva and Advair. She used to be taken care of her pulmonologist at [facility]. Also record showed alpha-1 MM. She has a sister with COPD on oxygen. Her father died from AIDS. She tells me that overall she's been stable except that in January 2017 she had an admission for pneumonia and COPD exacerbation. She then had another admission for the same in March 2017. Around this time she did have a CT chest for her history that showed clearance of the pneumonia. The only report I have is a CT chest from January 2017. I do not have the images for visualization with the reports a severe emphysema and patchy bilateral airspace disease compatible with pneumonia. She says when she moved here she was stable she had her oxygen but she's run out of her inhalers. The last few weeks a COPD has had exacerbation due to running out of inhalers and also the change in weather. She is more short of breath, more wheezing. Earlier this week she nearly passed out when she was off oxygen while shopping at [place].  Unfortunately she still smoking    12/28/2015 Follow up : COPD /PFT/CT results.  Pt returns for 6 weeks follow up .  She was seen for pulmonary consult  to establish for COPD. She recently moved here from [place]. She is on Advair and Spiriva. Uses oxygen with act As needed  And At bedtime   And tx for COPD exacerbation with Doxycycline and prednisone taper.  She is feeling better. Does have some daily cough and gets winded with activity .  She was set up for CT and PFT which we revewied today .  CT chest 12/04/15 showed emphysematous changes and pulmonary scarring w/ no acute process.  Did show age advance 3 vessel coronary artery califications.  PFT 12/28/15 shows FEV1 63%, ratio 78, FVC 63%, DLCO 37, TLC 67%.  Spiriva no longer covered. We discussed alternatives that are covered by her insurance as  She continues to smoking , advised on cessation.  Declines flu shot .   OV 05/12/2016  Chief Complaint  Patient presents with  . Follow-up    Pt c/o prod cough with yellow to green mucus, increase in SOB, midsternal CP, night sweats x 1.5 weeks.    Follow-up moderate COPD follow pulmonary function test  I first saw her in October 2017. She transferred her care from [facility]. At that time she was an active smoker on triple inhaler therapy. She followed up with my nurse practitioner December 2017. Pulmonary function test on triple inhaler therapy showed moderate COPD. She was then scheduled down to single agent long-acting anticholinergic. She says she's  been taking this diligently without any fail. The last 2 weeks he's had increased symptoms of shortness of breath, cough, wheezing that is rated as severe. In addition sputum volume is more than baseline and color changed to greenish. This. She does have coronary artery calcification CT chest. She tells me that she's had cardiac cath in [facility] and this was normal the last few years. She continues to smoke  OV 08/19/2016  Chief Complaint  Patient presents with  . Follow-up    Pt states her SOB has worsened. Pt c/o increase in SOB with little activity, wheezing, prod cough with yellow to green  mucus. Pt states she is up all night coughing.      follow-up moderate COPD  Since the last visit for unclear reasons not taking incruase or breo. Presumably this because she does not like I ordered inhaler. But she never cough. She is taking budesonide nebulizer a few times a day as needed. His only medication crrently. She wants to go back on dulera. Sh tell me that hr husband has Alzheimer's and is wandering around in the house at night causing high stress for her. She's unable to quit smoking. For the last few to several weeks she's having worsening cough, shortness of brath, chest tihtness and change insptum to gren color. She is Miserble withthis in quality.   has a past medical history of Bronchitis; COPD (chronic obstructive pulmonary disease) ([place]); Emphysema of lung ([place]); Hyperlipidemia; and Stroke ([place]).   reports that she has been smoking Cigarettes.  She has a 15.00 pack-year smoking history. She has never used smokeless tobacco.  No past surgical history on file.  Allergies  Allergen Reactions  . Levofloxacin Nausea And Vomiting  . Tramadol Nausea And Vomiting     There is no immunization history on file for this patient.  Family History  Problem Relation Age of Onset  . HIV Father   . Lung cancer Sister   . Lung cancer Paternal Aunt   . Stroke Maternal Grandmother   . Heart attack Maternal Grandfather   . Stroke Paternal Grandmother   . Hypertension Paternal Grandmother   . Heart attack Paternal Grandfather      Current Outpatient Prescriptions:  .  albuterol (PROVENTIL HFA;VENTOLIN HFA) 108 (90 Base) MCG/ACT inhaler, Inhale 2 puffs into the lungs every 4 (four) hours as needed for wheezing or shortness of breath., Disp: 1 Inhaler, Rfl: 2 .  aspirin 81 MG chewable tablet, Chew 81 mg by mouth daily., Disp: , Rfl:  .  budesonide (PULMICORT) 0.5 MG/2ML nebulizer solution, Take 0.5 mg by nebulization 2 (two) times daily., Disp: , Rfl:  .  diclofenac (VOLTAREN) 75 MG  EC tablet, Take 75 mg by mouth 2 (two) times daily., Disp: , Rfl:  .  ipratropium-albuterol (DUONEB) 0.5-2.5 (3) MG/3ML SOLN, Take 3 mLs by nebulization every 6 (six) hours as needed., Disp: , Rfl:  .  simvastatin (ZOCOR) 40 MG tablet, Take 1 tablet (40 mg total) by mouth daily. Must have Lipid check before future refills, Disp: 30 tablet, Rfl: 0 .  fluticasone furoate-vilanterol (BREO ELLIPTA) 100-25 MCG/INH AEPB, Inhale 1 puff into the lungs daily. (Patient not taking: Reported on 08/19/2016), Disp: 14 each, Rfl: 0      Review of Systems     Objective:   Physical Exam  Constitutional: She is oriented to person, place, and time. She appears well-developed and well-nourished. No distress.  HENT:  Head: Normocephalic and atraumatic.  Right Ear: External ear normal.  Left Ear: External ear normal.  Mouth/Throat: Oropharynx is clear and moist. No oropharyngeal exudate.  Eyes: Pupils are equal, round, and reactive to light. Conjunctivae and EOM are normal. Right eye exhibits no discharge. Left eye exhibits no discharge. No scleral icterus.  Neck: Normal range of motion. Neck supple. No JVD present. No tracheal deviation present. No thyromegaly present.  Cardiovascular: Normal rate, regular rhythm, normal heart sounds and intact distal pulses.  Exam reveals no gallop and no friction rub.   No murmur heard. Pulmonary/Chest: Effort normal. No respiratory distress. She has wheezes. She has no rales. She exhibits no tenderness.  Abdominal: Soft. Bowel sounds are normal. She exhibits no distension and no mass. There is no tenderness. There is no rebound and no guarding.  Musculoskeletal: Normal range of motion. She exhibits no edema or tenderness.  Lymphadenopathy:    She has no cervical adenopathy.  Neurological: She is alert and oriented to person, place, and time. She has normal reflexes. No cranial nerve deficit. She exhibits normal muscle tone. Coordination normal.  Skin: Skin is warm and  dry. No rash noted. She is not diaphoretic. No erythema. No pallor.  Psychiatric: She has a normal mood and affect. Her behavior is normal. Judgment and thought content normal.  Vitals reviewed.   Vitals:   08/19/16 1140  BP: 96/62  Pulse: (!) 107  SpO2: 96%  Weight: 178 lb 9.6 oz (81 kg)  Height: 5\' 5"  (1.651 m)    Estimated body mass index is 29.72 kg/m as calculated from the following:   Height as of this encounter: 5\' 5"  (1.651 m).   Weight as of this encounter: 178 lb 9.6 oz (81 kg).      Assessment:       ICD-10-CM   1. COPD exacerbation (HCC) J44.1        Plan:     Significant flare up Baseline meds are not the correct regimen +ongoing stress and humidity as reasons for flare up  PLAN IM depot medrol 80mg  x 1 in office now Xopenex nebulizer x 1 in office now STart cephalexin 500mg  three times daily x  5 days Start Please take Take prednisone 40mg  once daily x 3 days, then 30mg  once daily x 3 days, then 20mg  once daily x 3 days, then prednisone 10mg  once daily  x 3 days and stop  Stop budesonide nebs Start dulera high dose 2 puff twice daily Start spiriva respimat daily Use albuterol for rescue  Quit smoking when you can  Followup Go to ER or call us if getting worse 6 weeks with APP or sooner if needed   > 50% of this > 25 min visit spent in face to face counseling or coordination of care   Dr. Brand Males, M.D., [facility].C.P Pulmonary and Critical Care Medicine Staff Physician [place] [facility] and Critical [facility] Pager: [phone], If no answer or between  15:00h - 7:00h: call [phone]  08/19/2016 12:06 PM

## 2016-08-19 NOTE — Patient Instructions (Signed)
ICD-10-CM   1. COPD exacerbation (HCC) J44.1    Significant flare up Baseline meds are not the correct regimen +ongoing stress and humidity as reasons for flare up  PLAN IM depot medrol 80mg  x 1 in office now Xopenex nebulizer x 1 in office now STart cephalexin 500mg  three times daily x  5 days Start Please take Take prednisone 40mg  once daily x 3 days, then 30mg  once daily x 3 days, then 20mg  once daily x 3 days, then prednisone 10mg  once daily  x 3 days and stop  Stop budesonide nebs Start dulera high dose 2 puff twice daily Start spiriva respimat daily Use albuterol for rescue  Quit smoking when you can  Followup Go to ER or call us if getting worse 6 weeks with APP or sooner if needed

## 2016-09-05 ENCOUNTER — Telehealth: Payer: Self-pay | Admitting: Internal Medicine

## 2016-09-05 NOTE — Telephone Encounter (Signed)
Received a PA for pt's Spiriva Respimat. I contacted the pt's insurance company at [phone]. Spiriva Respimat has been approved through 01/26/17. I have called [facility] and made them aware of this. Nothing further was needed.

## 2016-09-14 DIAGNOSIS — J449 Chronic obstructive pulmonary disease, unspecified: Secondary | ICD-10-CM | POA: Diagnosis not present

## 2016-10-14 ENCOUNTER — Ambulatory Visit: Payer: Medicare HMO | Admitting: Internal Medicine

## 2016-10-15 DIAGNOSIS — J449 Chronic obstructive pulmonary disease, unspecified: Secondary | ICD-10-CM | POA: Diagnosis not present

## 2016-11-14 DIAGNOSIS — J449 Chronic obstructive pulmonary disease, unspecified: Secondary | ICD-10-CM | POA: Diagnosis not present

## 2016-12-15 DIAGNOSIS — J449 Chronic obstructive pulmonary disease, unspecified: Secondary | ICD-10-CM | POA: Diagnosis not present

## 2016-12-29 ENCOUNTER — Encounter (HOSPITAL_COMMUNITY): Payer: Self-pay | Admitting: Emergency Medicine

## 2016-12-29 ENCOUNTER — Ambulatory Visit (HOSPITAL_COMMUNITY)
Admission: EM | Admit: 2016-12-29 | Discharge: 2016-12-29 | Disposition: A | Discharge status: Home / Self Care | Payer: Self-pay | Attending: Emergency Medicine | Admitting: Emergency Medicine

## 2016-12-29 ENCOUNTER — Ambulatory Visit (INDEPENDENT_AMBULATORY_CARE_PROVIDER_SITE_OTHER): Payer: Self-pay

## 2016-12-29 ENCOUNTER — Other Ambulatory Visit: Payer: Self-pay

## 2016-12-29 DIAGNOSIS — S20229A Contusion of unspecified back wall of thorax, initial encounter: Secondary | ICD-10-CM

## 2016-12-29 DIAGNOSIS — M5136 Other intervertebral disc degeneration, lumbar region: Secondary | ICD-10-CM

## 2016-12-29 DIAGNOSIS — M5137 Other intervertebral disc degeneration, lumbosacral region: Secondary | ICD-10-CM | POA: Diagnosis not present

## 2016-12-29 DIAGNOSIS — W208XXA Other cause of strike by thrown, projected or falling object, initial encounter: Secondary | ICD-10-CM

## 2016-12-29 DIAGNOSIS — M533 Sacrococcygeal disorders, not elsewhere classified: Secondary | ICD-10-CM | POA: Diagnosis not present

## 2016-12-29 DIAGNOSIS — M549 Dorsalgia, unspecified: Secondary | ICD-10-CM

## 2016-12-29 MED ORDER — KETOROLAC TROMETHAMINE 30 MG/ML IJ SOLN
30.0000 mg | Freq: Once | INTRAMUSCULAR | Status: AC
  Administered 2016-12-29: 30 mg via INTRAMUSCULAR

## 2016-12-29 MED ORDER — KETOROLAC TROMETHAMINE 30 MG/ML IJ SOLN
INTRAMUSCULAR | Status: AC
  Filled 2016-12-29: qty 1

## 2016-12-29 MED ORDER — NAPROXEN 375 MG PO TABS: 375.0000 mg | ORAL_TABLET | Freq: Two times a day (BID) | ORAL | 0 refills | Status: DC

## 2016-12-29 MED ORDER — HYDROCODONE-ACETAMINOPHEN 5-325 MG PO TABS
ORAL_TABLET | ORAL | 0 refills | Status: DC
Start: 2016-12-29 — End: 2017-02-03

## 2016-12-29 NOTE — ED Triage Notes (Signed)
Pt states a cart hit her in the back this morning at 10am this morning, a metal part of the cart hit her hard in her back. C/o pain from tailbone all the way up her back.

## 2016-12-29 NOTE — Discharge Instructions (Addendum)
Place ice to the back for the next couple of days. Medications as directed, may cause drowsiness or increase in falls.
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
## 2017-01-14 DIAGNOSIS — J449 Chronic obstructive pulmonary disease, unspecified: Secondary | ICD-10-CM | POA: Diagnosis not present

## 2017-01-14 DIAGNOSIS — R69 Illness, unspecified: Secondary | ICD-10-CM | POA: Diagnosis not present

## 2017-01-26 ENCOUNTER — Ambulatory Visit: Payer: Medicare HMO | Admitting: Acute Care

## 2017-01-26 ENCOUNTER — Encounter: Payer: Self-pay | Admitting: Acute Care

## 2017-01-26 ENCOUNTER — Ambulatory Visit (INDEPENDENT_AMBULATORY_CARE_PROVIDER_SITE_OTHER)
Admission: RE | Admit: 2017-01-26 | Discharge: 2017-01-26 | Disposition: A | Discharge status: Home / Self Care | Payer: Medicare HMO | Source: Ambulatory Visit | Attending: Acute Care | Admitting: Acute Care

## 2017-01-26 VITALS — BP 122/62 | HR 110 | Ht 65.0 in | Wt 186.2 lb

## 2017-01-26 DIAGNOSIS — R69 Illness, unspecified: Secondary | ICD-10-CM | POA: Diagnosis not present

## 2017-01-26 DIAGNOSIS — J441 Chronic obstructive pulmonary disease with (acute) exacerbation: Secondary | ICD-10-CM

## 2017-01-26 DIAGNOSIS — F17200 Nicotine dependence, unspecified, uncomplicated: Secondary | ICD-10-CM | POA: Diagnosis not present

## 2017-01-26 DIAGNOSIS — M25511 Pain in right shoulder: Secondary | ICD-10-CM | POA: Diagnosis not present

## 2017-01-26 DIAGNOSIS — R0602 Shortness of breath: Secondary | ICD-10-CM | POA: Diagnosis not present

## 2017-01-26 DIAGNOSIS — R05 Cough: Secondary | ICD-10-CM | POA: Diagnosis not present

## 2017-01-26 MED ORDER — DOXYCYCLINE HYCLATE 100 MG PO TABS: 100.0000 mg | ORAL_TABLET | Freq: Two times a day (BID) | ORAL | 0 refills | Status: DC

## 2017-01-26 MED ORDER — LEVALBUTEROL HCL 0.63 MG/3ML IN NEBU
0.6300 mg | INHALATION_SOLUTION | Freq: Once | RESPIRATORY_TRACT | Status: AC
  Administered 2017-01-26: 0.63 mg via RESPIRATORY_TRACT

## 2017-01-26 MED ORDER — PREDNISONE 10 MG PO TABS: ORAL_TABLET | ORAL | 0 refills | Status: DC

## 2017-01-26 MED ORDER — BUDESONIDE 0.5 MG/2ML IN SUSP: 0.5000 mg | Freq: Two times a day (BID) | RESPIRATORY_TRACT | 5 refills | Status: DC
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
## 2017-01-26 NOTE — Assessment & Plan Note (Signed)
COPD exacerbation Started 3 weeks ago, patient declined OV sooner Patient is noncompliant with DuoNeb's or Pulmicort nebs She is compliant with her Dulera and Spiriva Plan: Xopenex treatment now Doxycycline 100 mg twice daily Prednisone taper; 10 mg tablets: 4 tabs x 2 days, 3 tabs x 2 days, 2 tabs x 2 days 1 tab x 2 days then stop.  CXR now ( Stat) We will call you with results Continue to use your Dulera 2 puffs daily Rinse mouth after use Continue Spiriva daily Use your Pulmicort twice daily every day without fail  We will renew Pulmicort Prescription Use your DuoNeb up to every 6 hours as needed. You need to quit smoking. This is the single most powerful action you can take to decrease your risk of lung cancer, and worsening pulmonary illness. Follow up in 2 weeks with Dr. Chase Caller or NP Please contact office for sooner follow up if symptoms do not improve or worsen or seek emergency care

## 2017-01-26 NOTE — Assessment & Plan Note (Signed)
Continued current every day smoker Plan I have spent 3 minutes counseling patient on smoking cessation this visit. Reviewed again with patient the risks that continued tobacco abuse presented to her pulmonary health Patient advised to quit smoking immediately Offered community support and counseling

## 2017-01-26 NOTE — Patient Instructions (Addendum)
Xopenex treatment now Doxycycline 100 mg twice daily Prednisone taper; 10 mg tablets: 4 tabs x 2 days, 3 tabs x 2 days, 2 tabs x 2 days 1 tab x 2 days then stop.  CXR now ( Stat) We will call you with results Continue to use your Dulera 2 puffs daily Rinse mouth after use Continue Spiriva daily Use your Pulmicort twice daily every day without fail  We will renew Pulmicort Prescription Use your DuoNeb up to every 6 hours as needed. You need to quit smoking. This is the single most powerful action you can take to decrease your risk of lung cancer, and worsening pulmonary illness. Follow up with PCP or Urgent care regarding your shoulder pain. Follow up in 2 weeks with Dr. Chase Caller or NP Please contact office for sooner follow up if symptoms do not improve or worsen or seek emergency care

## 2017-01-27 DIAGNOSIS — R69 Illness, unspecified: Secondary | ICD-10-CM | POA: Diagnosis not present

## 2017-02-03 ENCOUNTER — Ambulatory Visit (INDEPENDENT_AMBULATORY_CARE_PROVIDER_SITE_OTHER)
Admission: RE | Admit: 2017-02-03 | Discharge: 2017-02-03 | Disposition: A | Discharge status: Home / Self Care | Payer: Medicare HMO | Source: Ambulatory Visit | Attending: Family Medicine | Admitting: Family Medicine

## 2017-02-03 ENCOUNTER — Ambulatory Visit (INDEPENDENT_AMBULATORY_CARE_PROVIDER_SITE_OTHER): Payer: Medicare HMO | Admitting: Family Medicine

## 2017-02-03 ENCOUNTER — Ambulatory Visit: Payer: Medicare HMO | Admitting: Internal Medicine

## 2017-02-03 ENCOUNTER — Encounter: Payer: Self-pay | Admitting: Family Medicine

## 2017-02-03 VITALS — BP 134/76 | HR 71 | Temp 98.5°F | Ht 65.0 in | Wt 187.0 lb

## 2017-02-03 DIAGNOSIS — M25511 Pain in right shoulder: Secondary | ICD-10-CM | POA: Diagnosis not present

## 2017-02-03 DIAGNOSIS — M542 Cervicalgia: Secondary | ICD-10-CM | POA: Diagnosis not present

## 2017-02-03 DIAGNOSIS — M5412 Radiculopathy, cervical region: Secondary | ICD-10-CM
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
## 2017-02-03 NOTE — Patient Instructions (Signed)
We will call you with the results from today  I have made a referral to physical therapy  You can try rubbing Aspercreme with lidocaine and areas that hurt. He can try other things such as tumor, fish oil, and glucosamine for joint to decrease inflammation.

## 2017-02-04 DIAGNOSIS — M5412 Radiculopathy, cervical region: Secondary | ICD-10-CM | POA: Insufficient documentation
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
## 2017-02-04 NOTE — Assessment & Plan Note (Signed)
Pain does not seem to be related to the rotator cuff. Possible she has a mild or early capsulitis - Could consider an intra-articular injection to help differentiate if this pain is coming from her neck or the shoulder - Home Exercise Therapy

## 2017-02-09 ENCOUNTER — Telehealth: Payer: Self-pay | Admitting: *Deleted

## 2017-02-09 DIAGNOSIS — M5412 Radiculopathy, cervical region: Secondary | ICD-10-CM

## 2017-02-09 NOTE — Telephone Encounter (Signed)
Referral placed for physical therapy

## 2017-02-10 DIAGNOSIS — J439 Emphysema, unspecified: Secondary | ICD-10-CM | POA: Diagnosis not present

## 2017-02-10 DIAGNOSIS — Z7951 Long term (current) use of inhaled steroids: Secondary | ICD-10-CM | POA: Diagnosis not present

## 2017-02-10 DIAGNOSIS — Z803 Family history of malignant neoplasm of breast: Secondary | ICD-10-CM | POA: Diagnosis not present

## 2017-02-10 DIAGNOSIS — R69 Illness, unspecified: Secondary | ICD-10-CM | POA: Diagnosis not present

## 2017-02-10 DIAGNOSIS — Z7982 Long term (current) use of aspirin: Secondary | ICD-10-CM | POA: Diagnosis not present

## 2017-02-10 DIAGNOSIS — E669 Obesity, unspecified: Secondary | ICD-10-CM | POA: Diagnosis not present

## 2017-02-10 DIAGNOSIS — Z823 Family history of stroke: Secondary | ICD-10-CM | POA: Diagnosis not present

## 2017-02-10 DIAGNOSIS — E785 Hyperlipidemia, unspecified: Secondary | ICD-10-CM | POA: Diagnosis not present

## 2017-02-10 DIAGNOSIS — Z809 Family history of malignant neoplasm, unspecified: Secondary | ICD-10-CM | POA: Diagnosis not present

## 2017-02-10 DIAGNOSIS — K08409 Partial loss of teeth, unspecified cause, unspecified class: Secondary | ICD-10-CM | POA: Diagnosis not present

## 2017-02-14 DIAGNOSIS — J449 Chronic obstructive pulmonary disease, unspecified: Secondary | ICD-10-CM | POA: Diagnosis not present

## 2017-02-17 ENCOUNTER — Ambulatory Visit: Payer: Medicare HMO | Attending: Family Medicine | Admitting: Physical Therapy

## 2017-02-17 ENCOUNTER — Encounter: Payer: Self-pay | Admitting: Physical Therapy

## 2017-02-17 DIAGNOSIS — M25511 Pain in right shoulder: Secondary | ICD-10-CM | POA: Insufficient documentation

## 2017-02-17 DIAGNOSIS — M542 Cervicalgia: Secondary | ICD-10-CM

## 2017-02-17 DIAGNOSIS — M6281 Muscle weakness (generalized): Secondary | ICD-10-CM | POA: Diagnosis not present

## 2017-02-17 DIAGNOSIS — R209 Unspecified disturbances of skin sensation: Secondary | ICD-10-CM | POA: Diagnosis not present
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
## 2017-02-23 ENCOUNTER — Ambulatory Visit: Payer: Medicare HMO | Admitting: Physical Therapy

## 2017-02-23 ENCOUNTER — Encounter: Payer: Self-pay | Admitting: Physical Therapy

## 2017-02-23 DIAGNOSIS — M25511 Pain in right shoulder: Secondary | ICD-10-CM

## 2017-02-23 DIAGNOSIS — M6281 Muscle weakness (generalized): Secondary | ICD-10-CM

## 2017-02-23 DIAGNOSIS — M542 Cervicalgia: Secondary | ICD-10-CM | POA: Diagnosis not present

## 2017-02-23 DIAGNOSIS — R209 Unspecified disturbances of skin sensation: Secondary | ICD-10-CM | POA: Diagnosis not present

## 2017-02-23 NOTE — Patient Instructions (Signed)
Shoulder Retraction / External Rotation With Band    With band held in front, keep elbows at side while rotating hands apart and pulling shoulder blades back. Hold 1____ seconds. Repeat _5 to__10_ times. Do __1 to 2__ sessions per day.  Copyright  VHI. All rights reserved.  (Home) Retraction: Row - Bilateral (Anchor)    Facing anchor, arms reaching forward, pull hands toward stomach, pinching shoulder blades together. Repeat _5-10___ times per set. Do _1to 2___ sets per  Day. Use _Yellow band.    Copyright  VHI. All rights reserved.

## 2017-02-23 NOTE — Therapy (Signed)
[place], [place], [address] Phone: [phone]   Fax:  [phone]  Physical Therapy Treatment  Patient Details  Name: Rachel Wang MRN: [id number] Date of Birth: March 23, 1950 Referring Provider: Dr. Clearance Coots    Encounter Date: 02/23/2017  PT End of Session - 02/23/17 1726    Visit Number  2    Number of Visits  16    Date for PT Re-Evaluation  04/14/17    PT Start Time  1635    PT Stop Time  0175    PT Time Calculation (min)  40 min    Activity Tolerance  Patient tolerated treatment well    Behavior During Therapy  [facility] for tasks assessed/performed       Past Medical History:  Diagnosis Date  . Bronchitis   . COPD (chronic obstructive pulmonary disease) ([place])   . Emphysema of lung ([place])   . Hyperlipidemia   . Stroke [facility])     History reviewed. No pertinent surgical history.  There were no vitals filed for this visit.  Subjective Assessment - 02/23/17 1639    Subjective  7-8/10.  having a bad breathing day,  Needs 2 today  Has a portable tank.      Currently in Pain?  Yes    Pain Score  8     Pain Location  Neck    Pain Orientation  Right    Pain Descriptors / Indicators  Tingling;Spasm    Pain Radiating Towards  to shoulder day,  night  hurts from vrist to elbow .  Change of position at night,  sits on edge oif bed.     Pain Frequency  Constant    Aggravating Factors   turning over in bed  spasm increases with wrist and elbow,      Pain Relieving Factors  Prop up in bed,     Multiple Pain Sites  No         [facility] PT Assessment - 02/23/17 0001      AROM   Right Shoulder Flexion  93 Degrees    Right Shoulder ABduction  108 Degrees some scaption.                  [facility] Adult PT Treatment/Exercise - 02/23/17 0001      Self-Care   Posture  sitting posture,  support for back      Neck Exercises: Seated   Cervical Rotation  -- 3 reps    Lateral Flexion  -- 3  reps    Shoulder Flexion  10 reps    Shoulder Flexion Limitations  ROM limited painful    Shoulder ABduction  10 reps    Shoulder Abduction Limitations  AAROM,  ROM limited painful      Neck Exercises: Supine   Shoulder Flexion  --      Shoulder Exercises: Seated   Row  10 reps 2 sets.  1 in door,  1 held by PTA  Mod cues,  HEP    Theraband Level (Shoulder Row)  Level 1 (Yellow)    External Rotation  10 reps 2 sets ,  mod cues,  HEP    Theraband Level (Shoulder External Rotation)  Level 1 (Yellow)      Manual Therapy   Manual Therapy  Soft tissue mobilization    Manual therapy comments  tissue softened shoulder, upper back followed by gentle stretch of Right PEC    Soft tissue mobilization  light             PT Education - 02/23/17 1726    Education provided  Yes    Education Details  HEP.  Posture    Person(s) Educated  Patient    Methods  Explanation;Demonstration;Tactile cues;Verbal cues;Handout    Comprehension  Verbalized understanding;Returned demonstration       PT Short Term Goals - 02/23/17 1731      PT SHORT TERM GOAL #1   Title  Pt will be I with HEP for basic posture and cervical stabilization.     Baseline  independent and compliant with exercises so far    Time  4    Period  Weeks    Status  On-going      PT SHORT TERM GOAL #2   Title  Pt will be able to report reduction in Rt arm symptoms to rare, minimal.     Baseline  Mostly at night ,  not yet reduced    Time  4    Period  Weeks    Status  On-going      PT SHORT TERM GOAL #3   Title  Pt will be able to complete Balance screen and set goal if warranted.      Time  4    Period  Weeks    Status  Unable to assess      PT SHORT TERM GOAL #4   Title  Pt will be able to turn head, sleep and go about daily activities with 25% less pain overall.     Baseline  no lasting improvements yet,  short term    Time  4    Period  Weeks    Status  On-going        PT Long Term Goals - 02/17/17 2158       PT LONG TERM GOAL #1   Title  Pt will be able to improve FOTO score to less than 40 % impaired to demo improvement.     Time  8    Period  Weeks    Status  New    Target Date  04/14/17      PT LONG TERM GOAL #2   Title  Pt will be able to sleep with only min rare disturbance due to pain in neck, R UE.     Time  8    Period  Weeks    Status  New    Target Date  04/14/17      PT LONG TERM GOAL #3   Title  Pt will increase Rt UE to at least 4/5 and with greater ability to lift mod sized items in her home.     Time  8    Period  Weeks    Status  New    Target Date  04/14/17      PT LONG TERM GOAL #4   Title  Pt will be I with more advanced HEP     Time  8    Period  Weeks    Status  New    Target Date  04/14/17      PT LONG TERM GOAL #5   Title  Balance goal TBA     Time  8    Period  Weeks    Status  New            Plan - 02/23/17 1727    Clinical Impression Statement  Rom gradually improving with  scaption today 108.  Flexion 93,  ER right WNL.  Cervical ROM decreased some today to 40-50 degrees RT/ Left rotation. Painful end range.  pain at end of session 6/10.  She tolerated light soft tissue work right upper quadrant.  I was able to progress her HEP without increasing pain .  Progress toward her HEP goals.  She used portable )2 today due to breathing difficulties.    PT Next Visit Plan  check HEP, posture, UE AROM, manual if tolerated and consider IFC   ,  consider pulleys.  Nu step if no trouble bvreathing.    PT Home Exercise Plan  cervical retraction, gentle c AROM, posture ,  ER and row with band.    Consulted and Agree with Plan of Care  Patient       Patient will benefit from skilled therapeutic intervention in order to improve the following deficits and impairments:     Visit Diagnosis: Unspecified disturbances of skin sensation  Muscle weakness (generalized)  Acute pain of right shoulder  Cervicalgia     Problem List Patient Active Problem  List   Diagnosis Date Noted  . Cervical radiculopathy 02/04/2017  . Right shoulder pain 01/26/2017  . COPD, moderate ([place]) 05/12/2016  . Hyperlipidemia 01/25/2016  . History of stroke 01/25/2016  . COPD exacerbation ([place]) 11/09/2015  . Current every day smoker 11/09/2015  . History of pneumonia 11/09/2015    [facility]   PTA 02/23/2017, 5:33 PM  [facility] [address] [place], [place], [address] Phone: [phone]   Fax:  [phone]  Name: Salihah Peckham MRN: [id number] Date of Birth: 08-19-1950

## 2017-02-26 ENCOUNTER — Encounter: Payer: Self-pay | Admitting: Physical Therapy

## 2017-02-26 ENCOUNTER — Ambulatory Visit: Payer: Medicare HMO | Admitting: Physical Therapy

## 2017-02-26 DIAGNOSIS — M542 Cervicalgia: Secondary | ICD-10-CM | POA: Diagnosis not present

## 2017-02-26 DIAGNOSIS — M6281 Muscle weakness (generalized): Secondary | ICD-10-CM | POA: Diagnosis not present

## 2017-02-26 DIAGNOSIS — M25511 Pain in right shoulder: Secondary | ICD-10-CM | POA: Diagnosis not present

## 2017-02-26 DIAGNOSIS — R209 Unspecified disturbances of skin sensation: Secondary | ICD-10-CM | POA: Diagnosis not present

## 2017-02-26 NOTE — Therapy (Signed)
[place], [place], [address] Phone: [phone]   Fax:  [phone]  Physical Therapy Treatment  Patient Details  Name: Rachel Wang MRN: [id number] Date of Birth: 03-12-1950 Referring Provider: Dr. Clearance Coots    Encounter Date: 02/26/2017  PT End of Session - 02/26/17 1541    Visit Number  3    Number of Visits  16    Date for PT Re-Evaluation  04/14/17    PT Start Time  1540    PT Stop Time  1620    PT Time Calculation (min)  40 min    Activity Tolerance  Patient tolerated treatment well    Behavior During Therapy  [facility] for tasks assessed/performed       Past Medical History:  Diagnosis Date  . Bronchitis   . COPD (chronic obstructive pulmonary disease) ([place])   . Emphysema of lung ([place])   . Hyperlipidemia   . Stroke [facility])     History reviewed. No pertinent surgical history.  There were no vitals filed for this visit.  Subjective Assessment - 02/26/17 1541    Subjective  6/10 today.  Its better than the other day. No O2 tank needed today.  Burning in Rt. forearm last night.     Currently in Pain?  Yes    Pain Score  6         [facility] Adult PT Treatment/Exercise - 02/26/17 0001      Neck Exercises: Seated   Shoulder Rolls  Backwards;10 reps      Shoulder Exercises: Seated   Row  Strengthening;Both;10 reps    Theraband Level (Shoulder Row)  Level 1 (Yellow)    External Rotation  Strengthening;Both;20 reps    Theraband Level (Shoulder External Rotation)  Level 1 (Yellow)      Shoulder Exercises: Pulleys   Flexion  3 minutes      Ultrasound   Ultrasound Location  Rt. upper trap, levator    Ultrasound Parameters  1.0, 100% duty, 1 MHz and 1 W/cm2     Ultrasound Goals  Pain      Manual Therapy   Manual Therapy  Soft tissue mobilization;Myofascial release;Taping    Soft tissue mobilization  Rt. upper trap, levator scapula, rhomboids  very sore, guarded, tender    Myofascial  Release  Rt. upper quadrant       Neck Exercises: Stretches   Upper Trapezius Stretch  2 reps    Levator Stretch  3 reps        PT Education - 02/26/17 1622    Education provided  Yes    Education Details  shoulder elevation, reducing neck tension     Person(s) Educated  Patient    Methods  Explanation;Demonstration    Comprehension  Verbalized understanding       PT Short Term Goals - 02/23/17 1731      PT SHORT TERM GOAL #1   Title  Pt will be I with HEP for basic posture and cervical stabilization.     Baseline  independent and compliant with exercises so far    Time  4    Period  Weeks    Status  On-going      PT SHORT TERM GOAL #2   Title  Pt will be able to report reduction in Rt arm symptoms to rare, minimal.     Baseline  Mostly at night ,  not yet reduced    Time  4  Period  Weeks    Status  On-going      PT SHORT TERM GOAL #3   Title  Pt will be able to complete Balance screen and set goal if warranted.      Time  4    Period  Weeks    Status  Unable to assess      PT SHORT TERM GOAL #4   Title  Pt will be able to turn head, sleep and go about daily activities with 25% less pain overall.     Baseline  no lasting improvements yet,  short term    Time  4    Period  Weeks    Status  On-going        PT Long Term Goals - 02/17/17 2158      PT LONG TERM GOAL #1   Title  Pt will be able to improve FOTO score to less than 40 % impaired to demo improvement.     Time  8    Period  Weeks    Status  New    Target Date  04/14/17      PT LONG TERM GOAL #2   Title  Pt will be able to sleep with only min rare disturbance due to pain in neck, R UE.     Time  8    Period  Weeks    Status  New    Target Date  04/14/17      PT LONG TERM GOAL #3   Title  Pt will increase Rt UE to at least 4/5 and with greater ability to lift mod sized items in her home.     Time  8    Period  Weeks    Status  New    Target Date  04/14/17      PT LONG TERM GOAL #4    Title  Pt will be I with more advanced HEP     Time  8    Period  Weeks    Status  New    Target Date  04/14/17      PT LONG TERM GOAL #5   Title  Balance goal TBA     Time  8    Period  Weeks    Status  New            Plan - 02/26/17 1620    Clinical Impression Statement  Patient with less pain overall, Rt sided trigger point along Rt. upper trap and levator scap.  Taped for inhibition, used Kinesiotape.  Pt with min concern for balance especailly in AM.     PT Next Visit Plan  Berg ,check HEP, posture, UE AROM, manual if tolerated and consider IFC   ,  consider pulleys.  Nu step if no trouble bvreathing.    PT Home Exercise Plan  cervical retraction, gentle c AROM, posture ,  ER and row with band.    Consulted and Agree with Plan of Care  Patient       Patient will benefit from skilled therapeutic intervention in order to improve the following deficits and impairments:  Decreased range of motion, Increased fascial restricitons, Impaired UE functional use, Decreased activity tolerance, Pain, Hypomobility, Impaired flexibility, Decreased mobility, Decreased strength, Impaired sensation, Postural dysfunction  Visit Diagnosis: Unspecified disturbances of skin sensation  Muscle weakness (generalized)  Acute pain of right shoulder  Cervicalgia     Problem List Patient Active Problem List   Diagnosis Date  Noted  . Cervical radiculopathy 02/04/2017  . Right shoulder pain 01/26/2017  . COPD, moderate ([place]) 05/12/2016  . Hyperlipidemia 01/25/2016  . History of stroke 01/25/2016  . COPD exacerbation ([place]) 11/09/2015  . Current every day smoker 11/09/2015  . History of pneumonia 11/09/2015    Rachel Wang 02/26/2017, 4:27 PM  [facility] [address] [place], [place], [address] Phone: [phone]   Fax:  [phone]  Name: Rachel Wang MRN: [id number] Date of Birth: 1950-04-26  Raeford Razor, PT 02/26/17  4:27 PM Phone: [phone] Fax: [phone]

## 2017-03-02 ENCOUNTER — Telehealth: Payer: Self-pay | Admitting: Family

## 2017-03-02 ENCOUNTER — Ambulatory Visit: Payer: Medicare HMO | Admitting: Internal Medicine

## 2017-03-02 ENCOUNTER — Encounter: Payer: Self-pay | Admitting: Internal Medicine

## 2017-03-02 VITALS — BP 124/74 | HR 74 | Ht 65.0 in | Wt 187.8 lb

## 2017-03-02 DIAGNOSIS — J441 Chronic obstructive pulmonary disease with (acute) exacerbation: Secondary | ICD-10-CM

## 2017-03-02 MED ORDER — METHYLPREDNISOLONE ACETATE 80 MG/ML IJ SUSP
80.0000 mg | Freq: Once | INTRAMUSCULAR | Status: AC
  Administered 2017-03-02: 80 mg via INTRAMUSCULAR

## 2017-03-02 MED ORDER — LEVALBUTEROL HCL 0.63 MG/3ML IN NEBU
0.6300 mg | INHALATION_SOLUTION | Freq: Once | RESPIRATORY_TRACT | Status: AC
  Administered 2017-03-02: 0.63 mg via RESPIRATORY_TRACT

## 2017-03-02 MED ORDER — CEPHALEXIN 500 MG PO CAPS: 500.0000 mg | ORAL_CAPSULE | Freq: Three times a day (TID) | ORAL | 0 refills | Status: AC

## 2017-03-02 MED ORDER — SIMVASTATIN 40 MG PO TABS: 40.0000 mg | ORAL_TABLET | Freq: Every day | ORAL | 0 refills | Status: DC

## 2017-03-02 MED ORDER — PREDNISONE 10 MG PO TABS: ORAL_TABLET | ORAL | 0 refills | Status: DC

## 2017-03-02 NOTE — Telephone Encounter (Signed)
Pt is needing a refill on simvastatin (ZOCOR) 40 MG tablet sent in to [facility] on [address]. She is scheduled to see Ashleigh on 03/817 but has been out of this medication for a week. Can a supply be sent in to get her thought until her appointment?

## 2017-03-02 NOTE — Telephone Encounter (Signed)
Notified pt Per office policy sent 30 day to local pharmacy until appt.../lmb  

## 2017-03-02 NOTE — Patient Instructions (Addendum)
ICD-10-CM   1. COPD exacerbation (HCC) J44.1     Depo-Medrol 80 mg IM x1 in the office Xopenex or albuterol nebulizer x1 in the office  Take prednisone 40 mg daily x 2 days, then 20mg  daily x 2 days, then 10mg  daily x 2 days, then 5mg  daily x 2 days and stop  STart cephalexin 500mg  three times daily x  5 days  Defer tamiflu because > 3d since symptom onset  Continue scheduled bronchodilators  Followup - ER if worse or not better - REturn in 2 months or sooner if needed
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
## 2017-03-03 ENCOUNTER — Encounter: Payer: Self-pay | Admitting: Physical Therapy

## 2017-03-03 ENCOUNTER — Ambulatory Visit: Payer: Medicare HMO | Attending: Family Medicine | Admitting: Physical Therapy

## 2017-03-03 DIAGNOSIS — R209 Unspecified disturbances of skin sensation: Secondary | ICD-10-CM | POA: Diagnosis not present

## 2017-03-03 DIAGNOSIS — M25511 Pain in right shoulder: Secondary | ICD-10-CM | POA: Diagnosis not present

## 2017-03-03 DIAGNOSIS — M542 Cervicalgia: Secondary | ICD-10-CM | POA: Diagnosis not present

## 2017-03-03 DIAGNOSIS — M6281 Muscle weakness (generalized): Secondary | ICD-10-CM | POA: Insufficient documentation

## 2017-03-03 NOTE — Therapy (Signed)
[place], [place], [address] Phone: [phone]   Fax:  [phone]  Physical Therapy Treatment  Patient Details  Name: Rachel Wang MRN: [id number] Date of Birth: October 11, 1950 Referring Provider: Dr. Clearance Coots    Encounter Date: 03/03/2017  PT End of Session - 03/03/17 1109    Visit Number  4    Number of Visits  16    Date for PT Re-Evaluation  04/14/17    PT Start Time  1103    PT Stop Time  1141    PT Time Calculation (min)  38 min    Activity Tolerance  Patient tolerated treatment well    Behavior During Therapy  [facility] for tasks assessed/performed       Past Medical History:  Diagnosis Date  . Bronchitis   . COPD (chronic obstructive pulmonary disease) ([place])   . Emphysema of lung ([place])   . Hyperlipidemia   . Stroke [facility])     History reviewed. No pertinent surgical history.  There were no vitals filed for this visit.  Subjective Assessment - 03/03/17 1104    Subjective  6/10 L forearm.  I had a crisis this weekend, got a steriod shot.  Breathing hard.  Rt. arm was burning Sat.     Currently in Pain?  Yes    Pain Score  6     Pain Orientation  Left    Pain Descriptors / Indicators  Sore    Pain Type  Acute pain    Pain Onset  Today    Pain Frequency  Intermittent    Aggravating Factors   using arm     Pain Relieving Factors  propping, non use           [facility] Adult PT Treatment/Exercise - 03/03/17 0001      Standardized Balance Assessment   Standardized Balance Assessment  Berg Balance Test      Berg Balance Test   Sit to Stand  Able to stand without using hands and stabilize independently    Standing Unsupported  Able to stand safely 2 minutes    Sitting with Back Unsupported but Feet Supported on Floor or Stool  Able to sit safely and securely 2 minutes    Stand to Sit  Sits safely with minimal use of hands    Transfers  Able to transfer safely, minor use of hands    Standing Unsupported with Eyes Closed  Able to stand 10 seconds safely    Standing Ubsupported with Feet Together  Able to place feet together independently and stand 1 minute safely    From Standing, Reach Forward with Outstretched Arm  Can reach confidently >25 cm (10")    From Standing Position, Pick up Object from Floor  Able to pick up shoe safely and easily    From Standing Position, Turn to Look Behind Over each Shoulder  Looks behind from both sides and weight shifts well    Turn 360 Degrees  Able to turn 360 degrees safely in 4 seconds or less    Standing Unsupported, Alternately Place Feet on Step/Stool  Able to stand independently and safely and complete 8 steps in 20 seconds    Standing Unsupported, One Foot in Front  Able to place foot tandem independently and hold 30 seconds    Standing on One Leg  Able to lift leg independently and hold > 10 seconds    Total Score  56  Neck Exercises: Supine   Cervical Rotation  10 reps    Shoulder Flexion  20 reps and chest press x 10 cane     Shoulder Flexion Weights (lbs)  cane      Other Supine Exercise  supine scapular stabilization red x 10 :     Other Supine Exercise  horiz abd x 10 with cane       Neck Exercises: Stabilization   Stabilization  chin tuck x 10 in semireclined   , scapular retraction       Shoulder Exercises: ROM/Strengthening   UBE (Upper Arm Bike)  Nustep L 3 UE and LE for 7 min , endurance, strength              PT Education - 03/03/17 1140    Education provided  Yes    Education Details  need for functional reaching     Person(s) Educated  Patient    Methods  Explanation    Comprehension  Verbalized understanding       PT Short Term Goals - 03/03/17 1113      PT SHORT TERM GOAL #1   Title  Pt will be I with HEP for basic posture and cervical stabilization.     Baseline  needs cues for over head press , is compliant     Status  On-going      PT SHORT TERM GOAL #2   Title  Pt will be able to  report reduction in Rt arm symptoms to rare, minimal.     Baseline  Rt. arm is improved but today L arm was 6/10.     Status  On-going      PT SHORT TERM GOAL #3   Title  Pt will be able to complete Balance screen and set goal if warranted.      Status  Achieved      PT SHORT TERM GOAL #4   Title  Pt will be able to turn head, sleep and go about daily activities with 25% less pain overall.     Status  On-going        PT Long Term Goals - 02/17/17 2158      PT LONG TERM GOAL #1   Title  Pt will be able to improve FOTO score to less than 40 % impaired to demo improvement.     Time  8    Period  Weeks    Status  New    Target Date  04/14/17      PT LONG TERM GOAL #2   Title  Pt will be able to sleep with only min rare disturbance due to pain in neck, R UE.     Time  8    Period  Weeks    Status  New    Target Date  04/14/17      PT LONG TERM GOAL #3   Title  Pt will increase Rt UE to at least 4/5 and with greater ability to lift mod sized items in her home.     Time  8    Period  Weeks    Status  New    Target Date  04/14/17      PT LONG TERM GOAL #4   Title  Pt will be I with more advanced HEP     Time  8    Period  Weeks    Status  New    Target Date  04/14/17  PT LONG TERM GOAL #5   Title  Balance goal TBA     Time  8    Period  Weeks    Status  New            Plan - 03/03/17 1116    Clinical Impression Statement  Rt. arm and neck improved but with onset of L forearm pain (?related?) and R thigh numbness last night.  Less pain in forearm post. Berg balance 56/56 no goal needed.  Patient tired from her recent COPD exacerbation, left a bit early.     PT Treatment/Interventions  ADLs/Self Care Home Management;Iontophoresis 4mg /ml Dexamethasone;Patient/family education;Functional mobility training;Moist Heat;Traction;Ultrasound;Cryotherapy;Electrical Stimulation;Neuromuscular re-education;Balance training;Therapeutic exercise;Therapeutic activities;Manual  techniques;Taping;Dry needling;Passive range of motion    PT Next Visit Plan  check HEP, posture, UE AROM, manual if tolerated and consider IFC   ,  consider pulleys.  Nu step if no trouble bvreathing.    PT Home Exercise Plan  cervical retraction, gentle c AROM, posture ,  ER and row with band.    Consulted and Agree with Plan of Care  Patient       Patient will benefit from skilled therapeutic intervention in order to improve the following deficits and impairments:  Decreased range of motion, Increased fascial restricitons, Impaired UE functional use, Decreased activity tolerance, Pain, Hypomobility, Impaired flexibility, Decreased mobility, Decreased strength, Impaired sensation, Postural dysfunction  Visit Diagnosis: Unspecified disturbances of skin sensation  Muscle weakness (generalized)  Acute pain of right shoulder  Cervicalgia     Problem List Patient Active Problem List   Diagnosis Date Noted  . Cervical radiculopathy 02/04/2017  . Right shoulder pain 01/26/2017  . COPD, moderate ([place]) 05/12/2016  . Hyperlipidemia 01/25/2016  . History of stroke 01/25/2016  . COPD exacerbation ([place]) 11/09/2015  . Current every day smoker 11/09/2015  . History of pneumonia 11/09/2015    Keionna Kinnaird 03/03/2017, 11:42 AM  [facility] [address] [place], [place], [address] Phone: [phone]   Fax:  [phone]  Name: Alyna Stensland MRN: [id number] Date of Birth: Jun 09, 1950   Raeford Razor, PT 03/03/17 11:42 AM Phone: [phone] Fax: [phone]

## 2017-03-05 ENCOUNTER — Ambulatory Visit: Payer: Medicare HMO | Admitting: Physical Therapy

## 2017-03-05 ENCOUNTER — Encounter: Payer: Self-pay | Admitting: Physical Therapy

## 2017-03-05 DIAGNOSIS — M6281 Muscle weakness (generalized): Secondary | ICD-10-CM

## 2017-03-05 DIAGNOSIS — M542 Cervicalgia: Secondary | ICD-10-CM

## 2017-03-05 DIAGNOSIS — R209 Unspecified disturbances of skin sensation: Secondary | ICD-10-CM | POA: Diagnosis not present

## 2017-03-05 DIAGNOSIS — M25511 Pain in right shoulder: Secondary | ICD-10-CM

## 2017-03-05 NOTE — Therapy (Signed)
[place], [place], [address] Phone: [phone]   Fax:  [phone]  Physical Therapy Treatment  Patient Details  Name: Rachel Wang MRN: [id number] Date of Birth: 1950/08/31 Referring Provider: Dr. Clearance Coots    Encounter Date: 03/05/2017  PT End of Session - 03/05/17 1141    Visit Number  5    Number of Visits  16    Date for PT Re-Evaluation  04/14/17    PT Start Time  1100    PT Stop Time  1136    PT Time Calculation (min)  36 min    Activity Tolerance  Patient tolerated treatment well    Behavior During Therapy  [facility] for tasks assessed/performed       Past Medical History:  Diagnosis Date  . Bronchitis   . COPD (chronic obstructive pulmonary disease) ([place])   . Emphysema of lung ([place])   . Hyperlipidemia   . Stroke [facility])     History reviewed. No pertinent surgical history.  There were no vitals filed for this visit.  Subjective Assessment - 03/05/17 1105    Subjective  I feel great today.  No pain.  Maybe it the prednisone.     Currently in Pain?  No/denies       [facility] Adult PT Treatment/Exercise - 03/05/17 0001      Self-Care   Posture  seated, chin tuck with HEP    Other Self-Care Comments   HEP stretching, improvements      Neck Exercises: Supine   Shoulder Flexion  20 reps and chest press x 10 cane     Shoulder Flexion Weights (lbs)  cane        Shoulder Exercises: Standing   Horizontal ABduction  Strengthening;Both;10 reps    Flexion  Strengthening;Both;15 reps    Theraband Level (Shoulder Flexion)  Level 2 (Red)    Extension  Strengthening;Both;15 reps    Theraband Level (Shoulder Extension)  Level 2 (Red)    Row  Strengthening;Both;20 reps    Theraband Level (Shoulder Row)  Level 2 (Red)    Other Standing Exercises  standing upper back stretch , protract/retract x 10       Shoulder Exercises: ROM/Strengthening   UBE (Upper Arm Bike)  UBE L1 5 min     Wall  Pushups  10 reps    Other ROM/Strengthening Exercises  corner/doorway stretch 15 sec x 5       Neck Exercises: Stretches   Upper Trapezius Stretch  Right;Left;1 rep;30 seconds    Levator Stretch  Right;Left;1 rep;30 seconds               PT Short Term Goals - 03/05/17 1133      PT SHORT TERM GOAL #1   Title  Pt will be I with HEP for basic posture and cervical stabilization.     Status  Achieved      PT SHORT TERM GOAL #2   Title  Pt will be able to report reduction in Rt arm symptoms to rare, minimal.     Status  Achieved      PT SHORT TERM GOAL #3   Title  Pt will be able to complete Balance screen and set goal if warranted.      Status  Achieved      PT SHORT TERM GOAL #4   Title  Pt will be able to turn head, sleep and go about daily activities with 25% less pain overall.  Status  Achieved        PT Long Term Goals - 03/05/17 1134      PT LONG TERM GOAL #1   Title  Pt will be able to improve FOTO score to less than 40 % impaired to demo improvement.     Status  Unable to assess      PT LONG TERM GOAL #2   Title  Pt will be able to sleep with only min rare disturbance due to pain in neck, R UE.     Status  On-going      PT LONG TERM GOAL #3   Title  Pt will increase Rt UE to at least 4/5 and with greater ability to lift mod sized items in her home.     Status  Unable to assess      PT LONG TERM GOAL #4   Title  Pt will be I with more advanced HEP     Status  On-going      PT LONG TERM GOAL #5   Title  Balance goal TBA     Status  Deferred            Plan - 03/05/17 1138    Clinical Impression Statement  Patient feeling goo and hesitant to do anything to make it worse.  She fatigues quickly but has notably less pain in neck and arm.  Sleeping better.  Less arm symptoms.  WOuld like to finish POC next week.      PT Treatment/Interventions  ADLs/Self Care Home Management;Iontophoresis 4mg /ml Dexamethasone;Patient/family education;Functional  mobility training;Moist Heat;Traction;Ultrasound;Cryotherapy;Electrical Stimulation;Neuromuscular re-education;Balance training;Therapeutic exercise;Therapeutic activities;Manual techniques;Taping;Dry needling;Passive range of motion    PT Next Visit Plan  check HEP, posture, UE AROM, manual if tolerated and consider IFC   ,  consider pulleys.  Nu step if no trouble bvreathing.    PT Home Exercise Plan  cervical retraction, gentle c AROM, posture ,  ER and row with band. Stretch upper trap and levator     Consulted and Agree with Plan of Care  Patient       Patient will benefit from skilled therapeutic intervention in order to improve the following deficits and impairments:  Decreased range of motion, Increased fascial restricitons, Impaired UE functional use, Decreased activity tolerance, Pain, Hypomobility, Impaired flexibility, Decreased mobility, Decreased strength, Impaired sensation, Postural dysfunction  Visit Diagnosis: Unspecified disturbances of skin sensation  Muscle weakness (generalized)  Acute pain of right shoulder  Cervicalgia     Problem List Patient Active Problem List   Diagnosis Date Noted  . Cervical radiculopathy 02/04/2017  . Right shoulder pain 01/26/2017  . COPD, moderate ([place]) 05/12/2016  . Hyperlipidemia 01/25/2016  . History of stroke 01/25/2016  . COPD exacerbation ([place]) 11/09/2015  . Current every day smoker 11/09/2015  . History of pneumonia 11/09/2015    Vinie Charity 03/05/2017, 11:42 AM  [facility] [address] [place], [place], [address] Phone: [phone]   Fax:  [phone]  Name: Rachel Wang MRN: [id number] Date of Birth: May 25, 1950   Raeford Razor, PT 03/05/17 11:42 AM Phone: [phone] Fax: [phone]

## 2017-03-05 NOTE — Patient Instructions (Signed)
Flexibility: Upper Trapezius Stretch    Gently grasp right side of head while reaching behind back with other hand. Tilt head away until a gentle stretch is felt. Hold ___30_ seconds. Repeat __3__ times per set. Do _1___ sets per session. Do _2__ sessions per day.  http://orth.exer.us/341   Copyright  VHI. All rights reserved.    Levator Scapula Stretch    Place left hand on same side shoulder blade. With other hand, gently stretch head down and away. Hold __30__ seconds. Repeat __3__ times per set. Do ___1_ sets per session. Do __2__ sessions per day.  http://orth.exer.us/349   Copyright  VHI. All rights reserved.

## 2017-03-10 ENCOUNTER — Ambulatory Visit: Payer: Medicare HMO | Admitting: Physical Therapy

## 2017-03-10 ENCOUNTER — Encounter: Payer: Self-pay | Admitting: Physical Therapy

## 2017-03-10 DIAGNOSIS — M25511 Pain in right shoulder: Secondary | ICD-10-CM | POA: Diagnosis not present

## 2017-03-10 DIAGNOSIS — M6281 Muscle weakness (generalized): Secondary | ICD-10-CM

## 2017-03-10 DIAGNOSIS — R209 Unspecified disturbances of skin sensation: Secondary | ICD-10-CM | POA: Diagnosis not present

## 2017-03-10 DIAGNOSIS — M542 Cervicalgia: Secondary | ICD-10-CM | POA: Diagnosis not present

## 2017-03-10 NOTE — Therapy (Signed)
[place], [place], [address] Phone: [phone]   Fax:  [phone]  Physical Therapy Treatment/Discharge  Patient Details  Name: Rachel Wang MRN: [id number] Date of Birth: May 02, 1950 Referring Provider: Dr. Clearance Coots    Encounter Date: 03/10/2017  PT End of Session - 03/10/17 1132    Visit Number  6    PT Start Time  1103    PT Stop Time  1132    PT Time Calculation (min)  29 min    Activity Tolerance  Patient tolerated treatment well    Behavior During Therapy  [facility] for tasks assessed/performed       Past Medical History:  Diagnosis Date  . Bronchitis   . COPD (chronic obstructive pulmonary disease) ([place])   . Emphysema of lung ([place])   . Hyperlipidemia   . Stroke [facility])     History reviewed. No pertinent surgical history.  There were no vitals filed for this visit.  Subjective Assessment - 03/10/17 1103    Subjective  No pain today, I feel prety good.  Did my exercises all weekend!     Currently in Pain?  No/denies         [facility] PT Assessment - 03/10/17 0001      AROM   Right Shoulder Flexion  155 Degrees    Right Shoulder ABduction  150 Degrees    Cervical Flexion  45    Cervical Extension  50    Cervical - Right Side Bend  36    Cervical - Left Side Bend  37    Cervical - Right Rotation  70    Cervical - Left Rotation  72      Strength   Right Shoulder Flexion  4/5    Right Shoulder ABduction  4+/5    Left Shoulder Flexion  4/5    Left Shoulder ABduction  4+/5    Right Elbow Flexion  4+/5    Left Elbow Flexion  5/5                  [facility] Adult PT Treatment/Exercise - 03/10/17 0001      Self-Care   Posture  seated, chin tuck with HEP    Heat/Ice Application  heat, massage    Other Self-Care Comments   HEP stretching, improvements      Neck Exercises: Supine   Other Supine Exercise  horiz abd x 10 with red band       Shoulder Exercises: Seated   Horizontal ABduction  Strengthening;Both;12 reps    External Rotation  Strengthening;Both;10 reps      Shoulder Exercises: Standing   Flexion  Strengthening;Both;15 reps    Theraband Level (Shoulder Flexion)  Level 2 (Red)    Extension  Strengthening;Both;15 reps    Theraband Level (Shoulder Extension)  Level 2 (Red)    Row  Strengthening;Both;20 reps;Theraband    Theraband Level (Shoulder Row)  Level 3 (Green)      Shoulder Exercises: Pulleys   Flexion  3 minutes      Neck Exercises: Stretches   Upper Trapezius Stretch  Right;Left;2 reps;30 seconds    Levator Stretch  Right;Left;2 reps;30 seconds             PT Education - 03/10/17 1132    Education provided  Yes    Education Details  progress, cont to do HEP, be mindful of her posture  DC     Person(s) Educated  Patient  Methods  Explanation    Comprehension  Verbalized understanding       PT Short Term Goals - 03/05/17 1133      PT SHORT TERM GOAL #1   Title  Pt will be I with HEP for basic posture and cervical stabilization.     Status  Achieved      PT SHORT TERM GOAL #2   Title  Pt will be able to report reduction in Rt arm symptoms to rare, minimal.     Status  Achieved      PT SHORT TERM GOAL #3   Title  Pt will be able to complete Balance screen and set goal if warranted.      Status  Achieved      PT SHORT TERM GOAL #4   Title  Pt will be able to turn head, sleep and go about daily activities with 25% less pain overall.     Status  Achieved        PT Long Term Goals - 03/10/17 1120      PT LONG TERM GOAL #1   Title  Pt will be able to improve FOTO score to less than 40 % impaired to demo improvement.     Status  Achieved      PT LONG TERM GOAL #2   Title  Pt will be able to sleep with only min rare disturbance due to pain in neck, R UE.     Status  Achieved      PT LONG TERM GOAL #3   Title  Pt will increase Rt UE to at least 4/5 and with greater ability to lift mod sized items in her  home.     Status  Achieved      PT LONG TERM GOAL #4   Title  Pt will be I with more advanced HEP     Status  Achieved      PT LONG TERM GOAL #5   Title  Balance goal TBA     Status  Deferred       FOTO 38% on discharge (improved 10%)     Plan - 03/10/17 1123    Clinical Impression Statement  Patient has met all goals and is pleased with her progress. SHe is stronger and cervical ROM newar normal.  No pain interfering with her function.     PT Next Visit Plan  NA    PT Home Exercise Plan  cervical retraction, gentle c AROM, posture ,  ER and row with band. Stretch upper trap and levator , DC     Consulted and Agree with Plan of Care  Patient       Patient will benefit from skilled therapeutic intervention in order to improve the following deficits and impairments:     Visit Diagnosis: Unspecified disturbances of skin sensation  Muscle weakness (generalized)  Acute pain of right shoulder  Cervicalgia     Problem List Patient Active Problem List   Diagnosis Date Noted  . Cervical radiculopathy 02/04/2017  . Right shoulder pain 01/26/2017  . COPD, moderate ([place]) 05/12/2016  . Hyperlipidemia 01/25/2016  . History of stroke 01/25/2016  . COPD exacerbation ([place]) 11/09/2015  . Current every day smoker 11/09/2015  . History of pneumonia 11/09/2015    PAA,JENNIFER 03/10/2017, 11:34 AM  [facility] [address] [place], [place], [address] Phone: [phone]   Fax:  [phone]  Name: Rachel Wang MRN: [id number] Date of Birth: 11-Jul-1950  PHYSICAL THERAPY DISCHARGE SUMMARY  Visits from Start of Care: 6  Current functional level related to goals / functional outcomes:See above   Remaining deficits: Min strength deficits , endurance   Education / Equipment: Posture, HEP, breathing Plan: Patient agrees to discharge.  Patient goals were met. Patient is being discharged due to meeting the stated  rehab goals.  ?????     Raeford Razor, PT 03/10/17 11:34 AM Phone: [phone] Fax: [phone]

## 2017-03-12 ENCOUNTER — Ambulatory Visit: Payer: Medicare HMO | Admitting: Physical Therapy

## 2017-03-17 DIAGNOSIS — J449 Chronic obstructive pulmonary disease, unspecified: Secondary | ICD-10-CM | POA: Diagnosis not present

## 2017-04-03 ENCOUNTER — Ambulatory Visit (INDEPENDENT_AMBULATORY_CARE_PROVIDER_SITE_OTHER): Payer: Medicare HMO | Admitting: Nurse Practitioner

## 2017-04-03 ENCOUNTER — Other Ambulatory Visit (INDEPENDENT_AMBULATORY_CARE_PROVIDER_SITE_OTHER): Payer: Medicare HMO

## 2017-04-03 ENCOUNTER — Encounter: Payer: Self-pay | Admitting: Nurse Practitioner

## 2017-04-03 VITALS — BP 110/64 | HR 74 | Temp 98.8°F | Resp 16 | Ht 65.0 in | Wt 192.0 lb

## 2017-04-03 DIAGNOSIS — E782 Mixed hyperlipidemia: Secondary | ICD-10-CM

## 2017-04-03 DIAGNOSIS — K219 Gastro-esophageal reflux disease without esophagitis: Secondary | ICD-10-CM | POA: Diagnosis not present

## 2017-04-03 DIAGNOSIS — Z0000 Encounter for general adult medical examination without abnormal findings: Secondary | ICD-10-CM

## 2017-04-03 DIAGNOSIS — J449 Chronic obstructive pulmonary disease, unspecified: Secondary | ICD-10-CM | POA: Diagnosis not present

## 2017-04-03 DIAGNOSIS — R51 Headache: Secondary | ICD-10-CM

## 2017-04-03 DIAGNOSIS — R519 Headache, unspecified: Secondary | ICD-10-CM

## 2017-04-03 DIAGNOSIS — Z23 Encounter for immunization: Secondary | ICD-10-CM

## 2017-04-03 LAB — CBC WITH DIFFERENTIAL/PLATELET
BASOS ABS: 0.1 10*3/uL (ref 0.0–0.1)
BASOS PCT: 1.2 % (ref 0.0–3.0)
EOS ABS: 0.3 10*3/uL (ref 0.0–0.7)
Eosinophils Relative: 4.3 % (ref 0.0–5.0)
HCT: 41.1 % (ref 36.0–46.0)
Hemoglobin: 13.9 g/dL (ref 12.0–15.0)
LYMPHS PCT: 47.4 % — AB (ref 12.0–46.0)
Lymphs Abs: 3.3 10*3/uL (ref 0.7–4.0)
MCHC: 33.8 g/dL (ref 30.0–36.0)
MCV: 89.1 fl (ref 78.0–100.0)
MONO ABS: 0.6 10*3/uL (ref 0.1–1.0)
Monocytes Relative: 9.2 % (ref 3.0–12.0)
NEUTROS ABS: 2.6 10*3/uL (ref 1.4–7.7)
NEUTROS PCT: 37.9 % — AB (ref 43.0–77.0)
PLATELETS: 301 10*3/uL (ref 150.0–400.0)
RBC: 4.61 Mil/uL (ref 3.87–5.11)
RDW: 15.2 % (ref 11.5–15.5)
WBC: 6.9 10*3/uL (ref 4.0–10.5)

## 2017-04-03 LAB — COMPREHENSIVE METABOLIC PANEL
ALT: 12 U/L (ref 0–35)
AST: 12 U/L (ref 0–37)
Albumin: 3.9 g/dL (ref 3.5–5.2)
Alkaline Phosphatase: 82 U/L (ref 39–117)
BILIRUBIN TOTAL: 0.2 mg/dL (ref 0.2–1.2)
BUN: 8 mg/dL (ref 6–23)
CHLORIDE: 100 meq/L (ref 96–112)
CO2: 33 meq/L — AB (ref 19–32)
CREATININE: 0.8 mg/dL (ref 0.40–1.20)
Calcium: 9.8 mg/dL (ref 8.4–10.5)
GFR: 92.18 mL/min (ref >60.00)
GLUCOSE: 89 mg/dL (ref 70–99)
Potassium: 3.4 mEq/L — ABNORMAL LOW (ref 3.5–5.1)
SODIUM: 139 meq/L (ref 135–145)
Total Protein: 7.6 g/dL (ref 6.0–8.3)

## 2017-04-03 LAB — LIPID PANEL
CHOL/HDL RATIO: 3
Cholesterol: 156 mg/dL (ref 0–200)
HDL: 45 mg/dL (ref >39.00)
LDL CALC: 80 mg/dL (ref 0–99)
NonHDL: 110.63
TRIGLYCERIDES: 155 mg/dL — AB (ref 0.0–149.0)
VLDL: 31 mg/dL (ref 0.0–40.0)

## 2017-04-03 MED ORDER — ALBUTEROL SULFATE HFA 108 (90 BASE) MCG/ACT IN AERS: 2.0000 | INHALATION_SPRAY | RESPIRATORY_TRACT | 2 refills | Status: DC | PRN

## 2017-04-03 MED ORDER — PANTOPRAZOLE SODIUM 40 MG PO TBEC: 40.0000 mg | DELAYED_RELEASE_TABLET | Freq: Every day | ORAL | 1 refills | Status: DC

## 2017-04-03 NOTE — Assessment & Plan Note (Signed)
Reviewed HM today, she adamantly refuses colonoscopy and mammogram Need for vaccination with 13-polyvalent pneumococcal conjugate vaccine- Pneumococcal conjugate vaccine 13-valent

## 2017-04-03 NOTE — Assessment & Plan Note (Signed)
Continue simvastatin, asa No lipid panel on file-will update She is not fasting today but states she can not return later for fasting labs, would like to go ahead and have labs drawn today - CBC with Differential/Platelet; Future - Lipid panel; Future

## 2017-04-03 NOTE — Patient Instructions (Signed)
Please head downstairs for lab work.  Please start protonix 40mg  once daily for your acid reflux.  Think about quitting smoking! This will help your lungs, cholesterol and acid reflux.  Please return in about 1 month so I can see how you are doing.   Food Choices for Gastroesophageal Reflux Disease, Adult When you have gastroesophageal reflux disease (GERD), the foods you eat and your eating habits are very important. Choosing the right foods can help ease your discomfort. What guidelines do I need to follow?  Choose fruits, vegetables, whole grains, and low-fat dairy products.  Choose low-fat meat, fish, and poultry.  Limit fats such as oils, salad dressings, butter, nuts, and avocado.  Keep a food diary. This helps you identify foods that cause symptoms.  Avoid foods that cause symptoms. These may be different for everyone.  Eat small meals often instead of 3 large meals a day.  Eat your meals slowly, in a place where you are relaxed.  Limit fried foods.  Cook foods using methods other than frying.  Avoid drinking alcohol.  Avoid drinking large amounts of liquids with your meals.  Avoid bending over or lying down until 2-3 hours after eating. What foods are not recommended? These are some foods and drinks that may make your symptoms worse: Vegetables Tomatoes. Tomato juice. Tomato and spaghetti sauce. Chili peppers. Onion and garlic. Horseradish. Fruits Oranges, grapefruit, and lemon (fruit and juice). Meats High-fat meats, fish, and poultry. This includes hot dogs, ribs, ham, sausage, salami, and bacon. Dairy Whole milk and chocolate milk. Sour cream. Cream. Butter. Ice cream. Cream cheese. Drinks Coffee and tea. Bubbly (carbonated) drinks or energy drinks. Condiments Hot sauce. Barbecue sauce. Sweets/Desserts Chocolate and cocoa. Donuts. Peppermint and spearmint. Fats and Oils High-fat foods. This includes [place] fries and potato chips. Other Vinegar. Strong  spices. This includes black pepper, white pepper, red pepper, cayenne, curry powder, cloves, ginger, and chili powder. The items listed above may not be a complete list of foods and drinks to avoid. Contact your dietitian for more information. This information is not intended to replace advice given to you by your health care provider. Make sure you discuss any questions you have with your health care provider. Document Released: 07/15/2011 Document Revised: 06/21/2015 Document Reviewed: 11/17/2012 Elsevier Interactive Patient Education  2017 [employer].

## 2017-04-03 NOTE — Progress Notes (Signed)
Name: Rachel Wang   MRN: [id number]    DOB: 24-May-1950   Date:04/03/2017       Progress Note  Subjective  Chief Complaint  Chief Complaint  Patient presents with  . Establish Care    has had headache for the past 2 and a half weeks, heart burn    HPI Ms Frisby is establishing care with me today. She would like to discuss a headache and we will also talk about her history of hyperlipidemia. She is also followed by Dr Chase Caller with pulmonology for COPD.  Hyperlipidemia-maintained on simvastatin 40, aspirin 81 daily. She admits she sometimes skips her simvastatin but does not note any adverse effects of the medication including myalgias. She had two strokes 6 years ago and says she has been on statin and aspirin since. She denies any deficits from her strokes Diet- admits she does not eat very well since her husband moved into nursing facility and she is just cooking for herself now  No results found for: CHOL, HDL, LDLCALC, LDLDIRECT, TRIG, CHOLHDL  Headache- This is a new problem This problem began about 3 weeks ago She denies a history of headahces The headaches are intermittent, on both sides of head The headaches are a pounding pain that last for about 30 minutes The headaches will either resolve on their own or with OTC pain medications She says she does not sleep well, but this is not new for her. She says sometimes she feels weak in her right arm and left leg. She says she has occasionally been forgetting what she is saying while she is talking and sometimes her vision seems blurred. She denies falls, fevers, syncope. She is very concerned as shes had 2 family members pass away from brain aneurysm  GERD- This is not a new problem This started about two years ago She experiences heartburn and acid regurgitation every nigh She describes her acid reflux as "terrible"- even water makes it worse sometimes She says she does not like to take medications so she has only  been using tums, which only provides a little bit of relief   Patient Active Problem List   Diagnosis Date Noted  . Cervical radiculopathy 02/04/2017  . Right shoulder pain 01/26/2017  . COPD, moderate ([place]) 05/12/2016  . Hyperlipidemia 01/25/2016  . History of stroke 01/25/2016  . COPD exacerbation ([place]) 11/09/2015  . Current every day smoker 11/09/2015  . History of pneumonia 11/09/2015    No past surgical history on file.  Family History  Problem Relation Age of Onset  . HIV Father   . Lung cancer Sister   . Lung cancer Paternal Aunt   . Stroke Maternal Grandmother   . Heart attack Maternal Grandfather   . Stroke Paternal Grandmother   . Hypertension Paternal Grandmother   . Heart attack Paternal Grandfather     Social History   Socioeconomic History  . Marital status: Married    Spouse name: Not on file  . Number of children: 3  . Years of education: 36  . Highest education level: Not on file  Social Needs  . Financial resource strain: Not on file  . Food insecurity - worry: Not on file  . Food insecurity - inability: Not on file  . Transportation needs - medical: Not on file  . Transportation needs - non-medical: Not on file  Occupational History  . Occupation: Retired  Tobacco Use  . Smoking status: Current Every Day Smoker    Years: 30.00  Types: Cigarettes  . Smokeless tobacco: Never Used  . Tobacco comment: 1-2 cigs per day//03/02/2017-LCR  Substance and Sexual Activity  . Alcohol use: No  . Drug use: No  . Sexual activity: Not on file  Other Topics Concern  . Not on file  Social History Narrative   Fun: Dancing, cooking   Denies abuse and feels safe at home.    CNA for 30 years     Current Outpatient Medications:  .  albuterol (PROVENTIL HFA;VENTOLIN HFA) 108 (90 Base) MCG/ACT inhaler, Inhale 2 puffs into the lungs every 4 (four) hours as needed for wheezing or shortness of breath., Disp: 1 Inhaler, Rfl: 2 .  aspirin 81 MG chewable tablet,  Chew 81 mg by mouth daily., Disp: , Rfl:  .  budesonide (PULMICORT) 0.5 MG/2ML nebulizer solution, Take 2 mLs (0.5 mg total) by nebulization 2 (two) times daily., Disp: 120 mL, Rfl: 5 .  ipratropium-albuterol (DUONEB) 0.5-2.5 (3) MG/3ML SOLN, Take 3 mLs by nebulization every 6 (six) hours as needed., Disp: , Rfl:  .  mometasone-formoterol (DULERA) 200-5 MCG/ACT AERO, Inhale 2 puffs into the lungs 2 (two) times daily., Disp: 1 Inhaler, Rfl: 11 .  simvastatin (ZOCOR) 40 MG tablet, Take 1 tablet (40 mg total) by mouth daily. Must keep scheduled appt for future refills, Disp: 30 tablet, Rfl: 0 .  Tiotropium Bromide Monohydrate (SPIRIVA RESPIMAT) 2.5 MCG/ACT AERS, Inhale 2 puffs into the lungs daily., Disp: 1 Inhaler, Rfl: 11  Allergies  Allergen Reactions  . Levofloxacin Nausea And Vomiting  . Tramadol Nausea And Vomiting     ROS See HPI  Objective  Vitals:   04/03/17 1248  BP: 110/64  Pulse: 74  Resp: 16  Temp: 98.8 F (37.1 C)  TempSrc: Oral  SpO2: 94%  Weight: 192 lb (87.1 kg)  Height: 5\' 5"  (1.651 m)    Body mass index is 31.95 kg/m.  Physical Exam Vital signs reviewed. Constitutional: Patient appears well-developed and well-nourished. No distress.  HENT: Head: Normocephalic and atraumatic. Nose: Nose normal. Mouth/Throat: Oropharynx is clear and moist. No oropharyngeal exudate.  Eyes: Conjunctivae and EOM are normal. Pupils are equal, round, and reactive to light. No scleral icterus.  Neck: Normal range of motion. Neck supple. No JVD present. No thyromegaly present.  Cardiovascular: Normal rate, regular rhythm and normal heart sounds.  No murmur heard. No carotid bruit. Distal pulses intact. Pulmonary/Chest: Effort normal. No respiratory distress. Wheezing throughout. Musculoskeletal: Normal range of motion, no joint effusions. No gross deformities Neurological: She is alert and oriented to person, place, and time. No cranial nerve deficit. Coordination, balance,  strength, speech and gait are normal.  Skin: Skin is warm and dry. No rash noted. No erythema.  Psychiatric: Patient has a normal mood and affect. behavior is normal. Judgment and thought content normal.   PHQ2/9: Depression screen [facility] 2/9 04/03/2017 02/20/2016 01/25/2016  Decreased Interest 0 0 0  Down, Depressed, Hopeless 0 0 0  PHQ - 2 Score 0 0 0   Fall Risk: Fall Risk  04/03/2017 02/20/2016 01/25/2016  Falls in the past year? Yes No No  Number falls in past yr: 1 - -  Injury with Fall? No - -   Assessment & Plan RTC in 1 month for F/U of GERD, protonix initiation  Nonintractable episodic headache, unspecified headache type Normal neuro exam today She is able to get relief with OTC medications which is reassuring, however due to her age, h/o stroke and no h/o headaches it is reasonable  to order MRI today which she is agreeable to-she requests open MRI due to anxiety in closed MRI in the past - MR Brain Wo Contrast; Future - Comprehensive metabolic panel; Future

## 2017-04-03 NOTE — Assessment & Plan Note (Addendum)
Requesting refill of albuterol while in office today, will continue f/u with pulmonology Wheezing noted which is her baseline per last pulm visit note - albuterol (PROVENTIL HFA;VENTOLIN HFA) 108 (90 Base) MCG/ACT inhaler; Inhale 2 puffs into the lungs every 4 (four) hours as needed for wheezing or shortness of breath.  Dispense: 1 Inhaler; Refill: 2

## 2017-04-03 NOTE — Assessment & Plan Note (Addendum)
Discussed risks of uncontrolled GERD and recommended trial of PPI to manage symptoms-she is agreeable Also discussed GERD triggers and lifestyle modifications including smoking-See AVS for education provided to patient RTC 1 month F/U - Comprehensive metabolic panel; Future - CBC with Differential/Platelet; Future - pantoprazole (PROTONIX) 40 MG tablet; Take 1 tablet (40 mg total) by mouth daily.  Dispense: 30 tablet; Refill: 1

## 2017-04-10 ENCOUNTER — Telehealth: Payer: Self-pay

## 2017-04-10 NOTE — Telephone Encounter (Signed)
Copied from [place] [id number]. Topic: General - Other >> Apr 10, 2017 10:26 AM Oneta Rack wrote: Relation to pt: self Call back number: [phone]   Reason for call:  Patient attempted to have her MRI done but since she's claustrophobic was unable to complete the procedure. She was advised to inform PCP the other alternatives: patient being sedated or going to the hospital, please advise >> Apr 10, 2017 10:47 AM Oneta Rack wrote: Relation to pt: self Call back [phone]   Reason for call:  Patient attempted to have her MRI done but since she's claustrophobic was unable to complete the procedure. She was advised to inform PCP the other alternatives: patient being sedated or going to the hospital, please advise

## 2017-04-12 NOTE — Telephone Encounter (Signed)
The MRI was ordered as an open MRI per her request. Wondering if this was not what she was scheduled for?

## 2017-04-13 NOTE — Telephone Encounter (Signed)
Spoke with pt and gave her the option to get an anxiety medication to help her with the MRI or she can go to [facility] and be sedated to help her through the MRI. Pt stated that she would like to just wait on the MRI she would not like to get one right now. If you headaches persist or get worse then she will call us to let us know and we can resume from there. Pt understood.

## 2017-04-14 DIAGNOSIS — J449 Chronic obstructive pulmonary disease, unspecified: Secondary | ICD-10-CM | POA: Diagnosis not present

## 2017-05-01 ENCOUNTER — Ambulatory Visit: Payer: Medicare HMO | Admitting: Internal Medicine

## 2017-05-01 ENCOUNTER — Encounter: Payer: Self-pay | Admitting: Internal Medicine

## 2017-05-01 VITALS — BP 128/70 | HR 110 | Ht 65.0 in | Wt 189.4 lb

## 2017-05-01 DIAGNOSIS — Z129 Encounter for screening for malignant neoplasm, site unspecified: Secondary | ICD-10-CM | POA: Diagnosis not present

## 2017-05-01 DIAGNOSIS — J449 Chronic obstructive pulmonary disease, unspecified: Secondary | ICD-10-CM | POA: Diagnosis not present

## 2017-05-01 DIAGNOSIS — Z87891 Personal history of nicotine dependence: Secondary | ICD-10-CM | POA: Diagnosis not present

## 2017-05-01 DIAGNOSIS — J441 Chronic obstructive pulmonary disease with (acute) exacerbation: Secondary | ICD-10-CM | POA: Diagnosis not present

## 2017-05-01 MED ORDER — DOXYCYCLINE HYCLATE 100 MG PO TABS: 100.0000 mg | ORAL_TABLET | Freq: Two times a day (BID) | ORAL | 0 refills | Status: DC

## 2017-05-01 MED ORDER — PREDNISONE 10 MG PO TABS: ORAL_TABLET | ORAL | 0 refills | Status: DC

## 2017-05-01 NOTE — Patient Instructions (Addendum)
ICD-10-CM   1. COPD, moderate ([place]) J44.9   2. COPD exacerbation ([place]) J44.1   3. Cancer screening Z12.9   4. Smoking history Z87.891    COPD, moderate ([place]) COPD exacerbation ([place])  - flu shot defferred again due to flare up  - flare up due to polle - Take doxycycline 100mg  po twice daily x 5 days; take after meals and avoid sunlight - Please take prednisone 40 mg x1 day, then 30 mg x1 day, then 20 mg x1 day, then 10 mg x1 day, and then 5 mg x1 day and stop - refer pulmonary rehab  - continue spiriva and dulera scheduled and nebs as needed -    Cancer screening - refer cancer screening program  Smoking history - work on quitting   FOllowup 3 months or sooner if needed; CAT Score at followup

## 2017-05-01 NOTE — Progress Notes (Signed)
Subjective:     Patient ID: Rachel Wang, female   DOB: 09-Nov-1950, 67 y.o.   MRN: [id number]  HPI  Rachel Wang is a 67 y.o. female current every day smoker  with COPD and severe anemia. She uses home oxygen and triple inhaler therapy. She was seen in consult by Dr. Chase Caller.  Synopsis: 67 year old African-American female. Recently moved from [place] to [place], [place] in the last few months after recently being married. She reports a long-standing history of COPD. She does not know her severe anemia but she says it is quite severe. At baseline she is on oxygen with exertion and triple inhaler therapy with Spiriva and Dulera. She used to be taken care of her pulmonologist at [facility]. Also record showed alpha-1 MM. She has a sister with COPD on oxygen. Her father died from AIDS. She tells me that overall she's been stable except that in January 2017 she had an admission for pneumonia and COPD exacerbation. She then had another admission for the same in March 2017.   Maintenance Regimen: Dulera Spiriva Non-compliant with both duo nebs and Pulmicort nebs    IOV 11/09/2015  Chief Complaint  Patient presents with  . Pulmonary Consult    Self Referral, Pt. c/o recently moved here, having to use her oxygen more, has a portable tank as well as one at home, SOB all the time, been out of her inhalers since wed., prior to running out she was using them alot, coughing with phelgm,      67 year old African-American female. Recently moved from [place] to [place], [place] in the last few months after recently being married. She reports a long-standing history of COPD. She does not know her severe anemia but she says it is quite severe. At baseline she is on oxygen with exertion and triple inhaler therapy with Spiriva and Advair. She used to be taken care of her pulmonologist at [facility]. Also record showed alpha-1 MM. She has a sister with COPD on oxygen. Her father died from  AIDS. She tells me that overall she's been stable except that in January 2017 she had an admission for pneumonia and COPD exacerbation. She then had another admission for the same in March 2017. Around this time she did have a CT chest for her history that showed clearance of the pneumonia. The only report I have is a CT chest from January 2017. I do not have the images for visualization with the reports a severe emphysema and patchy bilateral airspace disease compatible with pneumonia. She says when she moved here she was stable she had her oxygen but she's run out of her inhalers. The last few weeks a COPD has had exacerbation due to running out of inhalers and also the change in weather. She is more short of breath, more wheezing. Earlier this week she nearly passed out when she was off oxygen while shopping at [place].  Unfortunately she still smoking    12/28/2015 Follow up : COPD /PFT/CT results.  Pt returns for 6 weeks follow up .  She was seen for pulmonary consult to establish for COPD. She recently moved here from [place]. She is on Advair and Spiriva. Uses oxygen with act As needed  And At bedtime   And tx for COPD exacerbation with Doxycycline and prednisone taper.  She is feeling better. Does have some daily cough and gets winded with activity .  She was set up for CT and PFT which we revewied today .  CT  chest 12/04/15 showed emphysematous changes and pulmonary scarring w/ no acute process.  Did show age advance 3 vessel coronary artery califications.  PFT 12/28/15 shows FEV1 63%, ratio 78, FVC 63%, DLCO 37, TLC 67%.  Spiriva no longer covered. We discussed alternatives that are covered by her insurance as  She continues to smoking , advised on cessation.  Declines flu shot .   OV 05/12/2016  Chief Complaint  Patient presents with  . Follow-up    Pt c/o prod cough with yellow to green mucus, increase in SOB, midsternal CP, night sweats x 1.5 weeks.    Follow-up moderate COPD  follow pulmonary function test  I first saw her in October 2017. She transferred her care from [facility]. At that time she was an active smoker on triple inhaler therapy. She followed up with my nurse practitioner December 2017. Pulmonary function test on triple inhaler therapy showed moderate COPD. She was then scheduled down to single agent long-acting anticholinergic. She says she's been taking this diligently without any fail. The last 2 weeks he's had increased symptoms of shortness of breath, cough, wheezing that is rated as severe. In addition sputum volume is more than baseline and color changed to greenish. This. She does have coronary artery calcification CT chest. She tells me that she's had cardiac cath in [facility] and this was normal the last few years. She continues to smoke  OV 08/19/2016  Chief Complaint  Patient presents with  . Follow-up    Pt states her SOB has worsened. Pt c/o increase in SOB with little activity, wheezing, prod cough with yellow to green mucus. Pt states she is up all night coughing.      follow-up moderate COPD  Since the last visit for unclear reasons not taking incruase or breo. Presumably this because she does not like I ordered inhaler. But she never cough. She is taking budesonide nebulizer a few times a day as needed. His only medication crrently. She wants to go back on dulera. Sh tell me that hr husband has Alzheimer's and is wandering around in the house at night causing high stress for her. She's unable to quit smoking. For the last few to several weeks she's having worsening cough, shortness of brath, chest tihtness and change insptum to gren color. She is Miserble withthis in quality.    01/26/2017 Acute OV for shortness of breath and green secretions. Of note, patient refused an earlier appointment .She presents today with complaints of shortness of breath and green secretions x 3 weeks. She has been using her Spiriva and Advair and has not  been  using her neb treatments.She is non-compliant with her DuoNebs or the Pulmicort nebs. She states they are too complicated to use. She states she needs her duoneb prescription refilled.She states she had fever with vomiting Thursday and Saturday.. She states she was injured at [facility] last week when a cart hit her right side.  Test Results: CXR 01/26/2017>> Interstitial thickening throughout the mid and lower lung zones. No edema or consolidation. Heart size within normal limits. There is aortic atherosclerosis   PFT    Component Value Date/Time   FEV1PRE 1.24 12/28/2015 1345   FEV1POST 1.30 12/28/2015 1345   FVCPRE 1.66 12/28/2015 1345   FVCPOST 1.68 12/28/2015 1345   TLC 3.52 12/28/2015 1345   DLCOUNC 9.64 12/28/2015 1345   PREFEV1FVCRT 75 12/28/2015 1345   PSTFEV1FVCRT 78 12/28/2015 1345    Dg Chest 2 View  Result Date: 01/26/2017 CLINICAL DATA:  Shortness of breath with cough and congestion EXAM: CHEST  2 VIEW COMPARISON:  Chest CT December 04, 2015 FINDINGS: There is persistent interstitial thickening throughout the mid and lower lung zones. There is no edema or consolidation. The heart size and pulmonary vascularity are normal. No adenopathy. There is aortic atherosclerosis. No evident bone lesions. IMPRESSION: Interstitial thickening throughout the mid and lower lung zones. No edema or consolidation. Heart size within normal limits. There is aortic atherosclerosis. Aortic Atherosclerosis (ICD10-I70.0). Electronically Signed   By: Lowella Grip III M.D.   On: 01/26/2017 09:47     OV 03/02/2017  Chief Complaint  Patient presents with  . Follow-up    copd- this weekend really rough, ran fever 102.0 didnt take anything for it but used breathing treatments and O2. cough is driving crazy affecting her sleep pattern. cough green sputum now was thick white.  wheezing. SOB with all activity. Nursing home and a scent triggered her exacerbation. very exhausted   67 year old female  with advanced COPD and ongoing smoking.  She reports ongoing compliance with her bronchodilator regimen.  She continues to smoke.  She had to have a flu shot this season.  On Friday, February 27, 2017 she went to nursing home to visit her demented husband and then got exposed to perfume and since then started having increased cough, shortness of breath and wheezing.  She had to go home and take nebulizer and oxygen.  Then on Saturday, February 28, 2017 she had a fever of 102.  This is since improved but she is still ongoing wheezing, sputum production that is green and shortness of breath and significant cough.  COPD CAT score shows significant high level of symptoms at 36 currently.  In the office she is wheezing.  She denies any flu exposure.    OV 05/01/2017  Chief Complaint  Patient presents with  . Follow-up    2 month f/u.  Pt states she has been doing better since last visit. Has problems in the morning with wheezing, cough with yellow to green mucus, and also some SOB     Rachel Wang presents for follow-up.  She has moderate COPD with frequent exacerbation.  Last visit she had significant exacerbation and flu shot was deferred.  She still is here to have the flu shot.  Currently she is feeling well but says the recent pollen load in the spring season in [place] has made a wheezing and cough with green mucus.  COPD CAT score is improved to 31 but she feels it is more than her baseline.  She says she is compliant with her inhalers documented below.  Symptom severity is documented below.  She again does not want a flu shot.  She lives near [facility] and is open to attending pulmonary rehabilitation.  Review of the chart shows last CT scan of the chest was in 2017 and she might be eligible for low-dose CT scan cancer screening.  CAT COPD Symptom & Quality of Life Score (GSK trademark) 0 is no burden. 5 is highest burden 03/02/2017  05/01/2017   Never Cough -> Cough all the time 5 4  No  phlegm in chest -> Chest is full of phlegm 5 5  No chest tightness -> Chest feels very tight 5 4  No dyspnea for 1 flight stairs/hill -> Very dyspneic for 1 flight of stairs 5 4  No limitations for ADL at home -> Very limited with ADL at home 4 4  Confident leaving  home -> Not at all confident leaving home 3 1  Sleep soundly -> Do not sleep soundly because of lung condition 5 5  Lots of Energy -> No energy at all 4 4  TOTAL Score (max 40)  36 31      has a past medical history of Bronchitis, COPD (chronic obstructive pulmonary disease) ([place]), Emphysema of lung ([place]), Hyperlipidemia, and Stroke ([place]).   reports that she has been smoking cigarettes.  She has a 7.50 pack-year smoking history. She has never used smokeless tobacco.  No past surgical history on file.  Allergies  Allergen Reactions  . Levofloxacin Nausea And Vomiting  . Tramadol Nausea And Vomiting    Immunization History  Administered Date(s) Administered  . Pneumococcal Conjugate-13 04/03/2017    Family History  Problem Relation Age of Onset  . HIV Father   . Lung cancer Sister   . Lung cancer Paternal Aunt   . Stroke Maternal Grandmother   . Heart attack Maternal Grandfather   . Stroke Paternal Grandmother   . Hypertension Paternal Grandmother   . Heart attack Paternal Grandfather      Current Outpatient Medications:  .  albuterol (PROVENTIL HFA;VENTOLIN HFA) 108 (90 Base) MCG/ACT inhaler, Inhale 2 puffs into the lungs every 4 (four) hours as needed for wheezing or shortness of breath., Disp: 1 Inhaler, Rfl: 2 .  aspirin 81 MG chewable tablet, Chew 81 mg by mouth daily., Disp: , Rfl:  .  budesonide (PULMICORT) 0.5 MG/2ML nebulizer solution, Take 2 mLs (0.5 mg total) by nebulization 2 (two) times daily., Disp: 120 mL, Rfl: 5 .  ipratropium-albuterol (DUONEB) 0.5-2.5 (3) MG/3ML SOLN, Take 3 mLs by nebulization every 6 (six) hours as needed., Disp: , Rfl:  .  mometasone-formoterol (DULERA) 200-5 MCG/ACT AERO,  Inhale 2 puffs into the lungs 2 (two) times daily., Disp: 1 Inhaler, Rfl: 11 .  pantoprazole (PROTONIX) 40 MG tablet, Take 1 tablet (40 mg total) by mouth daily., Disp: 30 tablet, Rfl: 1 .  simvastatin (ZOCOR) 40 MG tablet, Take 1 tablet (40 mg total) by mouth daily. Must keep scheduled appt for future refills, Disp: 30 tablet, Rfl: 0 .  Tiotropium Bromide Monohydrate (SPIRIVA RESPIMAT) 2.5 MCG/ACT AERS, Inhale 2 puffs into the lungs daily., Disp: 1 Inhaler, Rfl: 11     Review of Systems     Objective:   Physical Exam  Constitutional: She is oriented to person, place, and time. She appears well-developed and well-nourished. No distress.  Smells tobacco  HENT:  Head: Normocephalic and atraumatic.  Right Ear: External ear normal.  Left Ear: External ear normal.  Mouth/Throat: Oropharynx is clear and moist. No oropharyngeal exudate.  Eyes: Pupils are equal, round, and reactive to light. Conjunctivae and EOM are normal. Right eye exhibits no discharge. Left eye exhibits no discharge. No scleral icterus.  Neck: Normal range of motion. Neck supple. No JVD present. No tracheal deviation present. No thyromegaly present.  Cardiovascular: Normal rate, regular rhythm, normal heart sounds and intact distal pulses. Exam reveals no gallop and no friction rub.  No murmur heard. Pulmonary/Chest: Effort normal. No respiratory distress. She has wheezes. She has no rales. She exhibits no tenderness.  Abdominal: Soft. Bowel sounds are normal. She exhibits no distension and no mass. There is no tenderness. There is no rebound and no guarding.  Musculoskeletal: Normal range of motion. She exhibits no edema or tenderness.  Lymphadenopathy:    She has no cervical adenopathy.  Neurological: She is alert and oriented to  person, place, and time. She has normal reflexes. No cranial nerve deficit. She exhibits normal muscle tone. Coordination normal.  Skin: Skin is warm and dry. No rash noted. She is not  diaphoretic. No erythema. No pallor.  Psychiatric: She has a normal mood and affect. Her behavior is normal. Judgment and thought content normal.  Vitals reviewed.  Vitals:   05/01/17 1127  BP: 128/70  Pulse: (!) 110  SpO2: 100%  Weight: 189 lb 6.4 oz (85.9 kg)  Height: 5\' 5"  (1.651 m)    Estimated body mass index is 31.52 kg/m as calculated from the following:   Height as of this encounter: 5\' 5"  (1.651 m).   Weight as of this encounter: 189 lb 6.4 oz (85.9 kg).     Assessment:       ICD-10-CM   1. COPD, moderate ([place]) J44.9   2. COPD exacerbation ([place]) J44.1   3. Cancer screening Z12.9   4. Smoking history Z87.891        Plan:      COPD, moderate ([place]) COPD exacerbation ([place])  - flu shot defferred again due to flare up  - flare up due to polle - Take doxycycline 100mg  po twice daily x 5 days; take after meals and avoid sunlight - Please take prednisone 40 mg x1 day, then 30 mg x1 day, then 20 mg x1 day, then 10 mg x1 day, and then 5 mg x1 day and stop - refer pulmonary rehab  - continue spiriva and dulera scheduled and nebs as needed -    Cancer screening - refer cancer screening program  Smoking history - work on quitting   FOllowup 3 months or sooner if needed; CAT Score at followup  Dr. Brand Males, M.D., [facility].C.P Pulmonary and Critical Care Medicine Staff Physician, [place] Director - Interstitial Lung Disease  Program  Pulmonary Story at [place], [place], [address]  Pager: [phone], If no answer or between  15:00h - 7:00h: call [phone] Telephone: [phone]

## 2017-05-05 ENCOUNTER — Telehealth: Payer: Self-pay | Admitting: Internal Medicine

## 2017-05-05 DIAGNOSIS — Z122 Encounter for screening for malignant neoplasm of respiratory organs: Secondary | ICD-10-CM

## 2017-05-05 DIAGNOSIS — F17210 Nicotine dependence, cigarettes, uncomplicated: Secondary | ICD-10-CM

## 2017-05-05 NOTE — Telephone Encounter (Signed)
Pt is returning call from [place]. Cb is [phone].

## 2017-05-05 NOTE — Telephone Encounter (Signed)
Spoke with pt and scheduled SDMV 05/18/17 2:30 CT ordered Nothing further needed

## 2017-05-07 ENCOUNTER — Telehealth: Payer: Self-pay | Admitting: Internal Medicine

## 2017-05-07 ENCOUNTER — Telehealth (HOSPITAL_COMMUNITY): Payer: Self-pay

## 2017-05-07 DIAGNOSIS — J438 Other emphysema: Secondary | ICD-10-CM

## 2017-05-07 DIAGNOSIS — J449 Chronic obstructive pulmonary disease, unspecified: Secondary | ICD-10-CM

## 2017-05-07 NOTE — Telephone Encounter (Signed)
Placed a new order for pt to do pulm rehab.  Attempted to call Rachel Wang at [facility] letting her know new order was being placed but unable to reach her.  Left message for Rachel Wang stating this was being done.  Stated to Rachel Wang to return our call if anything else needed to be done, especially if a different order than the new one I placed needs to be sent in for pt.  At this time, nothing further is needed.

## 2017-05-07 NOTE — Telephone Encounter (Signed)
Called and spoke with Jarrett Soho at [facility] rehab who stated pt needed to have both places for diagnosis.  Placed a new order stating in both places for the diagnosis to be emphysema.  Jarrett Soho verified that the new order was correct and nothing further needed.  Will close encounter.

## 2017-05-07 NOTE — Telephone Encounter (Signed)
Called [facility] to request new order with new DX as Fev, FVC needs to be <70 on PFT.

## 2017-05-12 ENCOUNTER — Telehealth (HOSPITAL_COMMUNITY): Payer: Self-pay

## 2017-05-12 NOTE — Telephone Encounter (Signed)
Patients insurance is active and benefits verified through [facility] - $30.00 co-pay, no deductible, out of pocket amount of $4,200/$423.79 has been met, no co-insurance, and no pre-authorization is required. Reference #[id number]

## 2017-05-12 NOTE — Telephone Encounter (Signed)
Called to speak with patient in regards to Pulmonary Rehab and patient stated she is interested. Patient stated she is interested in the 1:30pm exc class. Explained to patient that the 1:30pm exc class is full and once a spot becomes available I will call to schedule. Patient verbally stated she understands.

## 2017-05-13 ENCOUNTER — Telehealth: Payer: Self-pay | Admitting: Acute Care

## 2017-05-13 NOTE — Telephone Encounter (Signed)
LMOM for pt that appt for [facility] and CT has been cancelled per pt request.  Will try to contact pt to reschedule.

## 2017-05-15 DIAGNOSIS — J449 Chronic obstructive pulmonary disease, unspecified: Secondary | ICD-10-CM | POA: Diagnosis not present

## 2017-05-18 ENCOUNTER — Encounter: Payer: Medicare HMO | Admitting: Acute Care

## 2017-05-18 ENCOUNTER — Ambulatory Visit: Payer: Medicare HMO

## 2017-05-18 ENCOUNTER — Other Ambulatory Visit: Payer: Self-pay | Admitting: Nurse Practitioner

## 2017-05-18 NOTE — Telephone Encounter (Signed)
Spoke with pt to reschedule SDMV and Ct.  Pt would like to hold off on scheduling for now. Will defer and call back later to reschedule.  Will close this message and refer to referral notes.

## 2017-05-18 NOTE — Telephone Encounter (Signed)
Copied from [place] [id number]. Topic: Quick Communication - Rx Refill/Question >> May 18, 2017  4:19 PM Ether Griffins B wrote: Medication: simvastatin (ZOCOR) 40 MG tablet  pt would like 90 day supply called in.   Has the patient contacted their pharmacy? Yes.   (Agent: If no, request that the patient contact the pharmacy for the refill.)  Preferred Pharmacy (with phone number or street name): [place], [place] - [address] Agent: Please be advised that RX refills may take up to 3 business days. We ask that you follow-up with your pharmacy.

## 2017-05-19 MED ORDER — SIMVASTATIN 40 MG PO TABS: 40.0000 mg | ORAL_TABLET | Freq: Every day | ORAL | 1 refills | Status: DC

## 2017-05-19 NOTE — Telephone Encounter (Signed)
Simvastatin refill-Requesting a 90 day supply Last OV: 04/03/17 Last Refill:03/02/17 30 tab/0 refills Pharmacy:  [place], [place] [phone] (Phone) [phone] (Fax)

## 2017-05-26 ENCOUNTER — Telehealth: Payer: Self-pay | Admitting: Nurse Practitioner

## 2017-05-26 NOTE — Telephone Encounter (Signed)
Attempted to call patient to schedule AWV, but phone disconnected. Patient will need to call office back to schedule Annual Wellness Visit. SF

## 2017-06-04 ENCOUNTER — Telehealth: Payer: Self-pay | Admitting: Internal Medicine

## 2017-06-04 DIAGNOSIS — J449 Chronic obstructive pulmonary disease, unspecified: Secondary | ICD-10-CM

## 2017-06-04 MED ORDER — ALBUTEROL SULFATE HFA 108 (90 BASE) MCG/ACT IN AERS: 2.0000 | INHALATION_SPRAY | RESPIRATORY_TRACT | 2 refills | Status: DC | PRN

## 2017-06-04 NOTE — Telephone Encounter (Signed)
Called and spoke with patient. Medication refill sent to pharmacy. Nothing further needed.

## 2017-06-09 ENCOUNTER — Ambulatory Visit: Payer: Medicare HMO | Admitting: Adult Health

## 2017-06-09 ENCOUNTER — Encounter: Payer: Self-pay | Admitting: Adult Health

## 2017-06-09 DIAGNOSIS — F17200 Nicotine dependence, unspecified, uncomplicated: Secondary | ICD-10-CM | POA: Diagnosis not present

## 2017-06-09 DIAGNOSIS — J441 Chronic obstructive pulmonary disease with (acute) exacerbation: Secondary | ICD-10-CM | POA: Diagnosis not present

## 2017-06-09 DIAGNOSIS — R69 Illness, unspecified: Secondary | ICD-10-CM | POA: Diagnosis not present

## 2017-06-09 MED ORDER — AMOXICILLIN-POT CLAVULANATE 875-125 MG PO TABS: 1.0000 | ORAL_TABLET | Freq: Two times a day (BID) | ORAL | 0 refills | Status: AC

## 2017-06-09 MED ORDER — PREDNISONE 10 MG PO TABS: ORAL_TABLET | ORAL | 0 refills | Status: DC

## 2017-06-09 NOTE — Addendum Note (Signed)
Addended by: Parke Poisson E on: 06/09/2017 02:57 PM   Modules accepted: Orders

## 2017-06-09 NOTE — Assessment & Plan Note (Signed)
Flare  Smoking cessation  Chest xray recommended, pt declines   Plan  Patient Instructions  Augmentin 875mg  Twice daily  For 1 week , take with food .  Prednisone taper over next week .  Robitussin DM As needed  Cough /congestion  Fluids and rest  Tylenol As needed  Fever.  Continue on Dulera and Spiriva  Follow up with Dr. Chase Caller in 2-3 months and As needed   Please contact office for sooner follow up if symptoms do not improve or worsen or seek emergency care

## 2017-06-09 NOTE — Progress Notes (Signed)
@Patient  ID: Rachel Wang, female    DOB: [birth year], 67 y.o.   MRN: [id number]  Chief Complaint  Patient presents with  . Acute Visit    COPD    Referring provider: Lance Sell, NP  HPI: 67 year old female active smoker followed for moderate COPD and Emphysema   TEST  Alpha 1 reported as MM  CT chest 12/04/15 showed emphysematous changes and pulmonary scarring w/ no acute process.  Did show age advance 3 vessel coronary artery califications.  PFT 12/28/15 shows FEV1 63%, ratio 78, FVC 63%, DLCO 37, TLC 67%.   06/09/2017 Acute OV : COPD  Patient presents for an acute office visit.  Patient complains over the last week that she has had increased cough with thick green mucus initial fever up to 102, shortness of breath and wheezing.  She is not taking any over-the-counter.  Denies any hemoptysis, chest pain, orthopnea or edema.  Appetite is down but she is drinking plenty of fluids she says. Patient remains on Dulera and Spiriva.   Patient continues to smoke smoking cessation was discussed.   Allergies  Allergen Reactions  . Levofloxacin Nausea And Vomiting  . Tramadol Nausea And Vomiting    Immunization History  Administered Date(s) Administered  . Pneumococcal Conjugate-13 04/03/2017    Past Medical History:  Diagnosis Date  . Bronchitis   . COPD (chronic obstructive pulmonary disease) ([place])   . Emphysema of lung ([place])   . Hyperlipidemia   . Stroke [facility])     Tobacco History: Social History   Tobacco Use  Smoking Status Current Every Day Smoker  . Packs/day: 0.25  . Years: 30.00  . Pack years: 7.50  . Types: Cigarettes  Smokeless Tobacco Never Used   Ready to quit: No Counseling given: Yes   Outpatient Encounter Medications as of 06/09/2017  Medication Sig  . albuterol (PROVENTIL HFA;VENTOLIN HFA) 108 (90 Base) MCG/ACT inhaler Inhale 2 puffs into the lungs every 4 (four) hours as needed for wheezing or shortness of breath.  Marland Kitchen aspirin 81 MG  chewable tablet Chew 81 mg by mouth daily.  . simvastatin (ZOCOR) 40 MG tablet Take 1 tablet (40 mg total) by mouth daily. Must keep scheduled appt for future refills  . amoxicillin-clavulanate (AUGMENTIN) 875-125 MG tablet Take 1 tablet by mouth 2 (two) times daily for 7 days.  . budesonide (PULMICORT) 0.5 MG/2ML nebulizer solution Take 2 mLs (0.5 mg total) by nebulization 2 (two) times daily. (Patient not taking: Reported on 06/09/2017)  . ipratropium-albuterol (DUONEB) 0.5-2.5 (3) MG/3ML SOLN Take 3 mLs by nebulization every 6 (six) hours as needed.  . mometasone-formoterol (DULERA) 200-5 MCG/ACT AERO Inhale 2 puffs into the lungs 2 (two) times daily. (Patient not taking: Reported on 06/09/2017)  . pantoprazole (PROTONIX) 40 MG tablet Take 1 tablet (40 mg total) by mouth daily. (Patient not taking: Reported on 06/09/2017)  . predniSONE (DELTASONE) 10 MG tablet 4 tabs for 2 days, then 3 tabs for 2 days, 2 tabs for 2 days, then 1 tab for 2 days, then stop  . Tiotropium Bromide Monohydrate (SPIRIVA RESPIMAT) 2.5 MCG/ACT AERS Inhale 2 puffs into the lungs daily. (Patient not taking: Reported on 06/09/2017)  . [DISCONTINUED] doxycycline (VIBRA-TABS) 100 MG tablet Take 1 tablet (100 mg total) by mouth 2 (two) times daily. (Patient not taking: Reported on 06/09/2017)  . [DISCONTINUED] predniSONE (DELTASONE) 10 MG tablet Take 40mg x1day,30mg x1day,20mg x1day,10mg x1day,5mg x1day,then stop (Patient not taking: Reported on 06/09/2017)   No facility-administered encounter medications on file as of  06/09/2017.      Review of Systems  Constitutional:   No  weight loss, night sweats,  Fevers, chills, + fatigue, or  lassitude.  HEENT:   No headaches,  Difficulty swallowing,  Tooth/dental problems, or  Sore throat,                No sneezing, itching, ear ache, nasal congestion, post nasal drip,   CV:  No chest pain,  Orthopnea, PND, swelling in lower extremities, anasarca, dizziness, palpitations, syncope.   GI   No heartburn, indigestion, abdominal pain, nausea, vomiting, diarrhea, change in bowel habits, loss of appetite, bloody stools.   Resp:   No chest wall deformity  Skin: no rash or lesions.  GU: no dysuria, change in color of urine, no urgency or frequency.  No flank pain, no hematuria   MS:  No joint pain or swelling.  No decreased range of motion.  No back pain.    Physical Exam  BP 124/66 (BP Location: Left Arm, Cuff Size: Normal)   Pulse (!) 111   Temp 98.6 F (37 C) (Oral)   Ht 5\' 5"  (1.651 m)   Wt 185 lb (83.9 kg)   SpO2 94%   BMI 30.79 kg/m   GEN: A/Ox3; pleasant , NAD, elderly    HEENT:  [place]/AT,  EACs-clear, TMs-wnl, NOSE-clear, THROAT-clear, no lesions, no postnasal drip or exudate noted.   NECK:  Supple w/ fair ROM; no JVD; normal carotid impulses w/o bruits; no thyromegaly or nodules palpated; no lymphadenopathy.    RESP scattered rhonchi . no accessory muscle use, no dullness to percussion  CARD:  RRR, no m/r/g, no peripheral edema, pulses intact, no cyanosis or clubbing.  GI:   Soft & nt; nml bowel sounds; no organomegaly or masses detected.   Musco: Warm bil, no deformities or joint swelling noted.   Neuro: alert, no focal deficits noted.    Skin: Warm, no lesions or rashes    Lab Results:   BMET  BNP No results found for: BNP  ProBNP No results found for: PROBNP  Imaging: No results found.   Assessment & Plan:   COPD exacerbation ([place]) Flare  Smoking cessation  Chest xray recommended, pt declines   Plan  Patient Instructions  Augmentin 875mg  Twice daily  For 1 week , take with food .  Prednisone taper over next week .  Robitussin DM As needed  Cough /congestion  Fluids and rest  Tylenol As needed  Fever.  Continue on Dulera and Spiriva  Follow up with Dr. Chase Caller in 2-3 months and As needed   Please contact office for sooner follow up if symptoms do not improve or worsen or seek emergency care        Current every day  smoker Cessation discussed      Rexene Edison, NP 06/09/2017

## 2017-06-09 NOTE — Patient Instructions (Addendum)
Augmentin 875mg  Twice daily  For 1 week , take with food .  Prednisone taper over next week .  Robitussin DM As needed  Cough /congestion  Fluids and rest  Tylenol As needed  Fever.  Continue on Dulera and Spiriva  Follow up with Dr. Chase Caller in 2-3 months and As needed   Please contact office for sooner follow up if symptoms do not improve or worsen or seek emergency care

## 2017-06-09 NOTE — Assessment & Plan Note (Signed)
Cessation discussed 

## 2017-06-11 ENCOUNTER — Telehealth (HOSPITAL_COMMUNITY): Payer: Self-pay

## 2017-06-11 NOTE — Telephone Encounter (Signed)
Called patient in regards to Pulmonary Rehab - Patient stated now is not a good time to schedule due to leaving to go out of town on a family matter. She is unsure how long she will be gone and would like to be called at the end of June. Will follow up then.

## 2017-06-14 DIAGNOSIS — J449 Chronic obstructive pulmonary disease, unspecified: Secondary | ICD-10-CM | POA: Diagnosis not present

## 2017-06-15 ENCOUNTER — Telehealth: Payer: Self-pay | Admitting: Internal Medicine

## 2017-06-15 NOTE — Telephone Encounter (Signed)
FYI: I have spoken with pt several times to get her scheduled for lung cancer screening.  Pt states that she does not want to participate at this time.  Referral has been cancelled.

## 2017-06-15 NOTE — Telephone Encounter (Signed)
Thanks and noted 

## 2017-07-08 ENCOUNTER — Telehealth: Payer: Self-pay | Admitting: Internal Medicine

## 2017-07-08 NOTE — Telephone Encounter (Signed)
Called and spoke with Jarrett Soho she states that nothing else needs to be done for this patient it can be disregarded. Will close out.

## 2017-07-15 DIAGNOSIS — J449 Chronic obstructive pulmonary disease, unspecified: Secondary | ICD-10-CM | POA: Diagnosis not present

## 2017-07-18 ENCOUNTER — Observation Stay (HOSPITAL_COMMUNITY)
Admission: EM | Admit: 2017-07-18 | Discharge: 2017-07-19 | Disposition: A | Discharge status: Home / Self Care | Payer: Medicare HMO | Attending: Family Medicine | Admitting: Family Medicine

## 2017-07-18 ENCOUNTER — Other Ambulatory Visit: Payer: Self-pay

## 2017-07-18 ENCOUNTER — Encounter (HOSPITAL_COMMUNITY): Payer: Self-pay

## 2017-07-18 ENCOUNTER — Emergency Department (HOSPITAL_COMMUNITY): Payer: Medicare HMO

## 2017-07-18 DIAGNOSIS — K219 Gastro-esophageal reflux disease without esophagitis: Secondary | ICD-10-CM | POA: Diagnosis not present

## 2017-07-18 DIAGNOSIS — Z8249 Family history of ischemic heart disease and other diseases of the circulatory system: Secondary | ICD-10-CM | POA: Diagnosis not present

## 2017-07-18 DIAGNOSIS — F17200 Nicotine dependence, unspecified, uncomplicated: Secondary | ICD-10-CM | POA: Diagnosis not present

## 2017-07-18 DIAGNOSIS — Z8673 Personal history of transient ischemic attack (TIA), and cerebral infarction without residual deficits: Secondary | ICD-10-CM | POA: Diagnosis not present

## 2017-07-18 DIAGNOSIS — F17210 Nicotine dependence, cigarettes, uncomplicated: Secondary | ICD-10-CM | POA: Insufficient documentation

## 2017-07-18 DIAGNOSIS — Z7982 Long term (current) use of aspirin: Secondary | ICD-10-CM | POA: Insufficient documentation

## 2017-07-18 DIAGNOSIS — Z91040 Latex allergy status: Secondary | ICD-10-CM | POA: Insufficient documentation

## 2017-07-18 DIAGNOSIS — Z881 Allergy status to other antibiotic agents status: Secondary | ICD-10-CM | POA: Diagnosis not present

## 2017-07-18 DIAGNOSIS — J449 Chronic obstructive pulmonary disease, unspecified: Secondary | ICD-10-CM

## 2017-07-18 DIAGNOSIS — J441 Chronic obstructive pulmonary disease with (acute) exacerbation: Secondary | ICD-10-CM | POA: Diagnosis not present

## 2017-07-18 DIAGNOSIS — J439 Emphysema, unspecified: Principal | ICD-10-CM | POA: Insufficient documentation

## 2017-07-18 DIAGNOSIS — R05 Cough: Secondary | ICD-10-CM | POA: Diagnosis not present

## 2017-07-18 DIAGNOSIS — Z9981 Dependence on supplemental oxygen: Secondary | ICD-10-CM | POA: Diagnosis not present

## 2017-07-18 DIAGNOSIS — E785 Hyperlipidemia, unspecified: Secondary | ICD-10-CM | POA: Diagnosis present

## 2017-07-18 DIAGNOSIS — R0602 Shortness of breath: Secondary | ICD-10-CM | POA: Diagnosis not present

## 2017-07-18 DIAGNOSIS — Z79899 Other long term (current) drug therapy: Secondary | ICD-10-CM | POA: Diagnosis not present

## 2017-07-18 DIAGNOSIS — R69 Illness, unspecified: Secondary | ICD-10-CM | POA: Diagnosis not present

## 2017-07-18 DIAGNOSIS — R0789 Other chest pain: Secondary | ICD-10-CM | POA: Diagnosis not present

## 2017-07-18 DIAGNOSIS — Z888 Allergy status to other drugs, medicaments and biological substances status: Secondary | ICD-10-CM | POA: Diagnosis not present

## 2017-07-18 DIAGNOSIS — R079 Chest pain, unspecified: Secondary | ICD-10-CM | POA: Diagnosis present

## 2017-07-18 LAB — LIPID PANEL
CHOL/HDL RATIO: 4.2 ratio
Cholesterol: 177 mg/dL (ref 0–200)
HDL: 42 mg/dL (ref >40)
LDL CALC: 113 mg/dL — AB (ref 0–99)
TRIGLYCERIDES: 112 mg/dL (ref <150)
VLDL: 22 mg/dL (ref 0–40)

## 2017-07-18 LAB — I-STAT TROPONIN, ED: TROPONIN I, POC: 0 ng/mL (ref 0.00–0.08)

## 2017-07-18 LAB — CBC WITH DIFFERENTIAL/PLATELET
Abs Immature Granulocytes: 0 10*3/uL (ref 0.0–0.1)
BASOS ABS: 0 10*3/uL (ref 0.0–0.1)
Basophils Relative: 1 %
EOS PCT: 1 %
Eosinophils Absolute: 0.1 10*3/uL (ref 0.0–0.7)
HEMATOCRIT: 44.2 % (ref 36.0–46.0)
HEMOGLOBIN: 13.8 g/dL (ref 12.0–15.0)
IMMATURE GRANULOCYTES: 0 %
LYMPHS ABS: 1.7 10*3/uL (ref 0.7–4.0)
LYMPHS PCT: 23 %
MCH: 28.8 pg (ref 26.0–34.0)
MCHC: 31.2 g/dL (ref 30.0–36.0)
MCV: 92.3 fL (ref 78.0–100.0)
Monocytes Absolute: 0.4 10*3/uL (ref 0.1–1.0)
Monocytes Relative: 6 %
Neutro Abs: 5.2 10*3/uL (ref 1.7–7.7)
Neutrophils Relative %: 69 %
Platelets: 293 10*3/uL (ref 150–400)
RBC: 4.79 MIL/uL (ref 3.87–5.11)
RDW: 14.4 % (ref 11.5–15.5)
WBC: 7.5 10*3/uL (ref 4.0–10.5)

## 2017-07-18 LAB — I-STAT CHEM 8, ED
BUN: <3 mg/dL — ABNORMAL LOW (ref 6–20)
CALCIUM ION: 1.14 mmol/L — AB (ref 1.15–1.40)
CHLORIDE: 102 mmol/L (ref 101–111)
Creatinine, Ser: 0.7 mg/dL (ref 0.44–1.00)
GLUCOSE: 144 mg/dL — AB (ref 65–99)
HCT: 44 % (ref 36.0–46.0)
HEMOGLOBIN: 15 g/dL (ref 12.0–15.0)
POTASSIUM: 3.4 mmol/L — AB (ref 3.5–5.1)
Sodium: 143 mmol/L (ref 135–145)
TCO2: 27 mmol/L (ref 22–32)

## 2017-07-18 LAB — TROPONIN I
Troponin I: <0.03 ng/mL (ref <0.03)
Troponin I: <0.03 ng/mL (ref <0.03)

## 2017-07-18 MED ORDER — ENOXAPARIN SODIUM 40 MG/0.4ML ~~LOC~~ SOLN
40.0000 mg | SUBCUTANEOUS | Status: DC
  Administered 2017-07-19: 40 mg via SUBCUTANEOUS
  Filled 2017-07-18: qty 0.4

## 2017-07-18 MED ORDER — ONDANSETRON HCL 4 MG/2ML IJ SOLN: 4.0000 mg | Freq: Four times a day (QID) | INTRAMUSCULAR | Status: DC | PRN

## 2017-07-18 MED ORDER — SODIUM CHLORIDE 0.9 % IV SOLN
250.0000 mL | INTRAVENOUS | Status: DC | PRN
Start: 2017-07-18 — End: 2017-07-19

## 2017-07-18 MED ORDER — TRAZODONE HCL 50 MG PO TABS
25.0000 mg | ORAL_TABLET | Freq: Every evening | ORAL | Status: DC | PRN
Start: 2017-07-18 — End: 2017-07-19

## 2017-07-18 MED ORDER — SODIUM CHLORIDE 0.9% FLUSH: 3.0000 mL | INTRAVENOUS | Status: DC | PRN

## 2017-07-18 MED ORDER — CALCIUM CARBONATE ANTACID 500 MG PO CHEW
1.0000 | CHEWABLE_TABLET | ORAL | Status: DC | PRN
Start: 2017-07-18 — End: 2017-07-19
  Filled 2017-07-18: qty 2

## 2017-07-18 MED ORDER — ONDANSETRON HCL 4 MG PO TABS: 4.0000 mg | ORAL_TABLET | Freq: Four times a day (QID) | ORAL | Status: DC | PRN

## 2017-07-18 MED ORDER — KETOROLAC TROMETHAMINE 15 MG/ML IJ SOLN: 15.0000 mg | Freq: Four times a day (QID) | INTRAMUSCULAR | Status: DC | PRN

## 2017-07-18 MED ORDER — IPRATROPIUM BROMIDE 0.02 % IN SOLN
RESPIRATORY_TRACT | Status: AC
  Administered 2017-07-18: 0.5 mg
  Filled 2017-07-18: qty 2.5

## 2017-07-18 MED ORDER — GI COCKTAIL ~~LOC~~
30.0000 mL | Freq: Once | ORAL | Status: AC
  Administered 2017-07-18: 30 mL via ORAL
  Filled 2017-07-18: qty 30

## 2017-07-18 MED ORDER — NITROGLYCERIN 0.4 MG SL SUBL: 0.4000 mg | SUBLINGUAL_TABLET | SUBLINGUAL | Status: DC | PRN

## 2017-07-18 MED ORDER — ALBUTEROL (5 MG/ML) CONTINUOUS INHALATION SOLN
INHALATION_SOLUTION | RESPIRATORY_TRACT | Status: AC
  Administered 2017-07-18: 14:00:00
  Filled 2017-07-18: qty 20

## 2017-07-18 MED ORDER — PANTOPRAZOLE SODIUM 40 MG PO TBEC: 40.0000 mg | DELAYED_RELEASE_TABLET | Freq: Every day | ORAL | Status: DC | PRN

## 2017-07-18 MED ORDER — ACETAMINOPHEN 650 MG RE SUPP: 650.0000 mg | Freq: Four times a day (QID) | RECTAL | Status: DC | PRN

## 2017-07-18 MED ORDER — GUAIFENESIN ER 600 MG PO TB12
600.0000 mg | ORAL_TABLET | Freq: Two times a day (BID) | ORAL | Status: DC
  Filled 2017-07-18 (×2): qty 1

## 2017-07-18 MED ORDER — ASPIRIN 325 MG PO TABS
325.0000 mg | ORAL_TABLET | Freq: Every day | ORAL | Status: DC
  Administered 2017-07-18 – 2017-07-19 (×2): 325 mg via ORAL
  Filled 2017-07-18 (×2): qty 1

## 2017-07-18 MED ORDER — ALBUTEROL SULFATE (2.5 MG/3ML) 0.083% IN NEBU
2.5000 mg | INHALATION_SOLUTION | RESPIRATORY_TRACT | Status: DC
  Administered 2017-07-18: 2.5 mg via RESPIRATORY_TRACT
  Filled 2017-07-18: qty 3

## 2017-07-18 MED ORDER — SODIUM CHLORIDE 0.9% FLUSH
3.0000 mL | Freq: Two times a day (BID) | INTRAVENOUS | Status: DC
  Administered 2017-07-19 (×2): 3 mL via INTRAVENOUS

## 2017-07-18 MED ORDER — ALBUTEROL SULFATE (2.5 MG/3ML) 0.083% IN NEBU
2.5000 mg | INHALATION_SOLUTION | Freq: Four times a day (QID) | RESPIRATORY_TRACT | Status: DC
  Administered 2017-07-19 (×2): 2.5 mg via RESPIRATORY_TRACT
  Filled 2017-07-18 (×2): qty 3

## 2017-07-18 MED ORDER — SIMVASTATIN 40 MG PO TABS
40.0000 mg | ORAL_TABLET | Freq: Every day | ORAL | Status: DC
  Administered 2017-07-19: 40 mg via ORAL
  Filled 2017-07-18 (×2): qty 1

## 2017-07-18 MED ORDER — GUAIFENESIN 100 MG/5ML PO SYRP
200.0000 mg | ORAL_SOLUTION | Freq: Three times a day (TID) | ORAL | Status: DC | PRN
  Filled 2017-07-18: qty 10

## 2017-07-18 MED ORDER — METHYLPREDNISOLONE SODIUM SUCC 125 MG IJ SOLR
125.0000 mg | Freq: Once | INTRAMUSCULAR | Status: AC
  Administered 2017-07-18: 125 mg via INTRAVENOUS
  Filled 2017-07-18: qty 2

## 2017-07-18 MED ORDER — METHYLPREDNISOLONE SODIUM SUCC 125 MG IJ SOLR: 60.0000 mg | Freq: Four times a day (QID) | INTRAMUSCULAR | Status: DC

## 2017-07-18 MED ORDER — METHYLPREDNISOLONE SODIUM SUCC 125 MG IJ SOLR
60.0000 mg | Freq: Four times a day (QID) | INTRAMUSCULAR | Status: DC
Start: 2017-07-18 — End: 2017-07-19
  Administered 2017-07-19 (×3): 60 mg via INTRAVENOUS
  Filled 2017-07-18 (×3): qty 2

## 2017-07-18 MED ORDER — ASPIRIN 81 MG PO CHEW: 81.0000 mg | CHEWABLE_TABLET | Freq: Every day | ORAL | Status: DC

## 2017-07-18 MED ORDER — DOXYCYCLINE HYCLATE 100 MG PO TABS
100.0000 mg | ORAL_TABLET | Freq: Two times a day (BID) | ORAL | Status: DC
  Administered 2017-07-19 (×2): 100 mg via ORAL
  Filled 2017-07-18 (×2): qty 1

## 2017-07-18 MED ORDER — ACETAMINOPHEN 325 MG PO TABS: 650.0000 mg | ORAL_TABLET | Freq: Four times a day (QID) | ORAL | Status: DC | PRN

## 2017-07-18 NOTE — H&P (Signed)
History and Physical    Rachel Wang QIW:[id number] DOB: Dec 06, 1950 DOA: 07/18/2017  PCP: Lance Sell, NP Patient coming from: home   Chief Complaint: sob  HPI: Rachel Wang is a 67 y.o. female with medical history significant copd ON HOME OXYGEN,hyperlipidemia, stroke,since emergency Department chief complaint worsening shortness of breath and chest pain. [facility] are asked to admit  Information is obtained from the patient and the chart. She states over the last week she's experienced gradual worsening shortness of breath and intermittent chest pain. She states she's used her home nebulizer with little relief. She describes the chest pain is intermittent sharp located left anterior chest worse with coughing and movement. Associated symptoms include more frequent coughing increased sputum production. She states the sputum is thick and white. She denies fever chills headache dizziness syncope or near-syncope. She denies abdominal pain nausea vomiting diarrhea constipation melena bright red blood per rectum. She denies any dysuria hematuria frequency or urgency. She does continue to smoke.  ED Course:  In the emergency department he is provided with hour-long nebulizer and 125 mg of Solu-Medrol. The time of admission she has improved work of breathing but remains audibly wheezy. She is chest pain-free at the time of admission  Review of Systems: As per HPI otherwise all other systems reviewed and are negative.   Ambulatory Status: lives at home alone is is a walker minimal assist with ADLs  Past Medical History:  Diagnosis Date  . Bronchitis   . COPD (chronic obstructive pulmonary disease) ([place])   . Emphysema of lung ([place])   . Hyperlipidemia   . Stroke [facility])     History reviewed. No pertinent surgical history.  Social History   Socioeconomic History  . Marital status: Married    Spouse name: Not on file  . Number of children: 3  . Years of education: 38    . Highest education level: Not on file  Occupational History  . Occupation: Retired  Scientific laboratory technician  . Financial resource strain: Not on file  . Food insecurity:    Worry: Not on file    Inability: Not on file  . Transportation needs:    Medical: Not on file    Non-medical: Not on file  Tobacco Use  . Smoking status: Current Every Day Smoker    Packs/day: 0.25    Years: 30.00    Pack years: 7.50    Types: Cigarettes  . Smokeless tobacco: Never Used  Substance and Sexual Activity  . Alcohol use: No  . Drug use: No  . Sexual activity: Not on file  Lifestyle  . Physical activity:    Days per week: Not on file    Minutes per session: Not on file  . Stress: Not on file  Relationships  . Social connections:    Talks on phone: Not on file    Gets together: Not on file    Attends religious service: Not on file    Active member of club or organization: Not on file    Attends meetings of clubs or organizations: Not on file    Relationship status: Not on file  . Intimate partner violence:    Fear of current or ex partner: Not on file    Emotionally abused: Not on file    Physically abused: Not on file    Forced sexual activity: Not on file  Other Topics Concern  . Not on file  Social History Narrative   Fun: Dancing, cooking  Denies abuse and feels safe at home.    CNA for 30 years    Allergies  Allergen Reactions  . Levofloxacin Nausea And Vomiting  . Tramadol Nausea And Vomiting  . Adhesive [Tape] Rash and Other (See Comments)    WELTS, also  . Latex Rash and Other (See Comments)    WELTS, also    Family History  Problem Relation Age of Onset  . HIV Father   . Lung cancer Sister   . Lung cancer Paternal Aunt   . Stroke Maternal Grandmother   . Heart attack Maternal Grandfather   . Stroke Paternal Grandmother   . Hypertension Paternal Grandmother   . Heart attack Paternal Grandfather     Prior to Admission medications   Medication Sig Start Date End Date  Taking? Authorizing Provider  albuterol (PROVENTIL HFA;VENTOLIN HFA) 108 (90 Base) MCG/ACT inhaler Inhale 2 puffs into the lungs every 4 (four) hours as needed for wheezing or shortness of breath. 06/04/17  Yes Brand Males, MD  aspirin 81 MG chewable tablet Chew 81 mg by mouth daily.   Yes [provider]  calcium carbonate (TUMS - DOSED IN MG ELEMENTAL CALCIUM) 500 MG chewable tablet Chew 1-2 tablets by mouth as needed for indigestion or heartburn.   Yes [provider]  guaifenesin (ROBITUSSIN) 100 MG/5ML syrup Take 200 mg by mouth 3 (three) times daily as needed (to loosen phlegm).   Yes [provider]  ipratropium-albuterol (DUONEB) 0.5-2.5 (3) MG/3ML SOLN Take 3 mLs by nebulization every 6 (six) hours as needed (for shortness of breath or wheezing).    Yes [provider]  mometasone-formoterol (DULERA) 200-5 MCG/ACT AERO Inhale 2 puffs into the lungs 2 (two) times daily. 08/19/16  Yes Brand Males, MD  pantoprazole (PROTONIX) 40 MG tablet Take 1 tablet (40 mg total) by mouth daily. Patient taking differently: Take 40 mg by mouth daily as needed (for indigestion).  04/03/17  Yes Lance Sell, NP  simvastatin (ZOCOR) 40 MG tablet Take 1 tablet (40 mg total) by mouth daily. Must keep scheduled appt for future refills Patient taking differently: Take 40 mg by mouth at bedtime.  05/19/17  Yes Lance Sell, NP    Physical Exam: Vitals:   07/18/17 1335 07/18/17 1351 07/18/17 1400  BP: (!) 150/94  109/85  Pulse: (!) 114  (!) 108  Resp: (!) 32  (!) 24  Temp: 98.8 F (37.1 C)    TempSrc: Oral    SpO2: 96% 100% 100%     General:  Appears slightly anxious sitting up in bed in no acute distress Eyes:  PERRL, EOMI, normal lids, iris ENT:  grossly normal hearing, lips & tongue, mucous membranes of her mouth are moist and pink Neck:  no LAD, masses or thyromegaly Cardiovascular:  Tachycardic but regular, no m/r/g. No LE edema.    Respiratory:  Mild to moderate increased work of breathing with conversation. Orbital wheezing. Breath sounds are quite diminished throughout. Abdomen:  soft, ntnd, positive bowel sounds throughout no guarding or rebounding Skin:  no rash or induration seen on limited exam Musculoskeletal:  grossly normal tone BUE/BLE, good ROM, no bony abnormality Psychiatric:  grossly normal mood and affect, speech fluent and appropriate, AOx3 Neurologic:  CN 2-12 grossly intact, moves all extremities in coordinated fashion, sensation intact speech clear facial symmetry  Labs on Admission: I have personally reviewed following labs and imaging studies  CBC: Recent Labs  Lab 07/18/17 1420 07/18/17 1432  WBC 7.5  --  NEUTROABS 5.2  --   HGB 13.8 15.0  HCT 44.2 44.0  MCV 92.3  --   PLT 293  --    Basic Metabolic Panel: Recent Labs  Lab 07/18/17 1432  NA 143  K 3.4*  CL 102  GLUCOSE 144*  BUN <3*  CREATININE 0.70   GFR: CrCl cannot be calculated (Unknown ideal weight.). Liver Function Tests: No results for input(s): AST, ALT, ALKPHOS, BILITOT, PROT, ALBUMIN in the last 168 hours. No results for input(s): LIPASE, AMYLASE in the last 168 hours. No results for input(s): AMMONIA in the last 168 hours. Coagulation Profile: No results for input(s): INR, PROTIME in the last 168 hours. Cardiac Enzymes: No results for input(s): CKTOTAL, CKMB, CKMBINDEX, TROPONINI in the last 168 hours. BNP (last 3 results) No results for input(s): PROBNP in the last 8760 hours. HbA1C: No results for input(s): HGBA1C in the last 72 hours. CBG: No results for input(s): GLUCAP in the last 168 hours. Lipid Profile: No results for input(s): CHOL, HDL, LDLCALC, TRIG, CHOLHDL, LDLDIRECT in the last 72 hours. Thyroid Function Tests: No results for input(s): TSH, T4TOTAL, FREET4, T3FREE, THYROIDAB in the last 72 hours. Anemia Panel: No results for input(s): VITAMINB12, FOLATE, FERRITIN, TIBC, IRON, RETICCTPCT in  the last 72 hours. Urine analysis: No results found for: COLORURINE, APPEARANCEUR, LABSPEC, PHURINE, GLUCOSEU, HGBUR, BILIRUBINUR, KETONESUR, PROTEINUR, UROBILINOGEN, NITRITE, LEUKOCYTESUR  Creatinine Clearance: CrCl cannot be calculated (Unknown ideal weight.).  Sepsis Labs: @LABRCNTIP (procalcitonin:4,lacticidven:4) )No results found for this or any previous visit (from the past 240 hour(s)).   Radiological Exams on Admission: Dg Chest Portable 1 View  Result Date: 07/18/2017 CLINICAL DATA:  Shortness of breath 2 days on home oxygen. EXAM: PORTABLE CHEST 1 VIEW COMPARISON:  01/26/2017 FINDINGS: Lungs are adequately inflated without focal airspace consolidation or effusion. Cardiomediastinal silhouette and remainder of the exam is unchanged. IMPRESSION: No acute cardiopulmonary disease. Electronically Signed   By: Marin Olp M.D.   On: 07/18/2017 16:01    EKG: Independently reviewed. Sinus tachycardia Septal infarct , age undetermined Abnormal ECG  Assessment/Plan Principal Problem:   COPD exacerbation (HCC) Active Problems:   Chest pain   Gastroesophageal reflux disease   Current every day smoker   Hyperlipidemia   #1. COPD with exacerbation. Patient states her trigger is change of weather. Review indicates baseline home oxygen at 2 L and triple inhaler therapy with Spiriva and Dulera. This x-ray as noted above. Oxygen saturation level greater than 90% on 2 L. -Admit -Continue scheduled nebulizers -Continue Solu-Medrol -Monitor oxygen saturation level -Doxy  #2. Chest pain. Likely related to above but patient does have risk factors for cad. Lipidemia tobacco user, hx stroke -cycle troponin -Serial EKG -GI cocktail -aspirin -statin -lipid panel  3. Hyperlipidemia -lipid panel -stain  #4. Tobacco user -cessation counseling    DVT prophylaxis: lovenox  Code Status: full  Family Communication: sister at bedside  Disposition Plan: home hopefully 24 hours   Consults called: none  Admission status: obs    Radene Gunning MD [facility]  If 7PM-7AM, please contact night-coverage [email or web address] Password [facility]  07/18/2017, 4:58 PM

## 2017-07-18 NOTE — ED Provider Notes (Signed)
[facility] EMERGENCY DEPARTMENT Provider Note   CSN: [id number] Arrival date & time: 07/18/17  1329     History   Chief Complaint Chief Complaint  Patient presents with  . Shortness of Breath    HPI Rachel Wang is a 67 y.o. female.  HPI Patient presents with shortness of breath.  Has had cough for the last few days.  Has some white to yellow sputum.  States she does not have a bad infection because she does not have green sputum.  No fevers but has felt more fatigued.  Has had chest tightness.  History of COPD with emphysema and chronic bronchitis.  Has had some dull chest pain at times.  States with exertion she gets some chest pain. Past Medical History:  Diagnosis Date  . Bronchitis   . COPD (chronic obstructive pulmonary disease) ([place])   . Emphysema of lung ([place])   . Hyperlipidemia   . Stroke [facility])     Patient Active Problem List   Diagnosis Date Noted  . Gastroesophageal reflux disease 04/03/2017  . Routine general medical examination at a health care facility 04/03/2017  . Encounter for preventive care 04/03/2017  . Cervical radiculopathy 02/04/2017  . COPD, moderate ([place]) 05/12/2016  . Hyperlipidemia 01/25/2016  . History of stroke 01/25/2016  . COPD exacerbation ([place]) 11/09/2015  . Current every day smoker 11/09/2015  . History of pneumonia 11/09/2015    History reviewed. No pertinent surgical history.   OB History   None      Home Medications    Prior to Admission medications   Medication Sig Start Date End Date Taking? Authorizing Provider  albuterol (PROVENTIL HFA;VENTOLIN HFA) 108 (90 Base) MCG/ACT inhaler Inhale 2 puffs into the lungs every 4 (four) hours as needed for wheezing or shortness of breath. 06/04/17  Yes Brand Males, MD  aspirin 81 MG chewable tablet Chew 81 mg by mouth daily.   Yes [provider]  calcium carbonate (TUMS - DOSED IN MG ELEMENTAL CALCIUM) 500 MG chewable tablet Chew 1-2 tablets by  mouth as needed for indigestion or heartburn.   Yes [provider]  guaifenesin (ROBITUSSIN) 100 MG/5ML syrup Take 200 mg by mouth 3 (three) times daily as needed (to loosen phlegm).   Yes [provider]  ipratropium-albuterol (DUONEB) 0.5-2.5 (3) MG/3ML SOLN Take 3 mLs by nebulization every 6 (six) hours as needed (for shortness of breath or wheezing).    Yes [provider]  mometasone-formoterol (DULERA) 200-5 MCG/ACT AERO Inhale 2 puffs into the lungs 2 (two) times daily. 08/19/16  Yes Brand Males, MD  pantoprazole (PROTONIX) 40 MG tablet Take 1 tablet (40 mg total) by mouth daily. Patient taking differently: Take 40 mg by mouth daily as needed (for indigestion).  04/03/17  Yes Lance Sell, NP  simvastatin (ZOCOR) 40 MG tablet Take 1 tablet (40 mg total) by mouth daily. Must keep scheduled appt for future refills Patient taking differently: Take 40 mg by mouth at bedtime.  05/19/17  Yes Lance Sell, NP  predniSONE (DELTASONE) 10 MG tablet 4 tabs for 2 days, then 3 tabs for 2 days, 2 tabs for 2 days, then 1 tab for 2 days, then stop Patient not taking: Reported on 07/18/2017 06/09/17   Parrett, Fonnie Mu, NP  Tiotropium Bromide Monohydrate (SPIRIVA RESPIMAT) 2.5 MCG/ACT AERS Inhale 2 puffs into the lungs daily. Patient not taking: Reported on 07/18/2017 08/19/16   Brand Males, MD    Family History Family History  Problem Relation Age of Onset  . HIV Father   . Lung cancer Sister   . Lung cancer Paternal Aunt   . Stroke Maternal Grandmother   . Heart attack Maternal Grandfather   . Stroke Paternal Grandmother   . Hypertension Paternal Grandmother   . Heart attack Paternal Grandfather     Social History Social History   Tobacco Use  . Smoking status: Current Every Day Smoker    Packs/day: 0.25    Years: 30.00    Pack years: 7.50    Types: Cigarettes  . Smokeless tobacco: Never Used  Substance Use Topics  . Alcohol use: No  .  Drug use: No     Allergies   Levofloxacin; Tramadol; Adhesive [tape]; and Latex   Review of Systems Review of Systems  Constitutional: Negative for appetite change.  HENT: Negative for congestion.   Respiratory: Positive for cough and shortness of breath.   Cardiovascular: Positive for chest pain.  Gastrointestinal: Negative for abdominal pain.  Genitourinary: Negative for flank pain.  Musculoskeletal: Negative for back pain.  Skin: Negative for rash.  Neurological: Negative for seizures.  Hematological: Negative for adenopathy.  Psychiatric/Behavioral: Negative for confusion.     Physical Exam Updated Vital Signs BP 109/85   Pulse (!) 108   Temp 98.8 F (37.1 C) (Oral)   Resp (!) 24   SpO2 100%   Physical Exam  Constitutional: She appears well-developed.  HENT:  Head: Normocephalic.  Eyes: EOM are normal.  Neck: Neck supple.  Cardiovascular:  Mild tachycardia  Pulmonary/Chest:  Diffuse harsh breath sounds with wheezing.  Some mild respiratory distress.  Abdominal: Soft. There is no tenderness.  Musculoskeletal:       Right lower leg: She exhibits no edema.       Left lower leg: She exhibits no edema.  Neurological: She is alert.  Skin: Skin is warm. Capillary refill takes less than 2 seconds.     ED Treatments / Results  Labs (all labs ordered are listed, but only abnormal results are displayed) Labs Reviewed  I-STAT CHEM 8, ED - Abnormal; Notable for the following components:      Result Value   Potassium 3.4 (*)    BUN <3 (*)    Glucose, Bld 144 (*)    Calcium, Ion 1.14 (*)    All other components within normal limits  CBC WITH DIFFERENTIAL/PLATELET  I-STAT TROPONIN, ED    EKG EKG Interpretation  Date/Time:  Saturday July 18 2017 13:33:18 EDT Ventricular Rate:  109 PR Interval:  128 QRS Duration: 78 QT Interval:  344 QTC Calculation: 463 R Axis:   73 Text Interpretation:  Sinus tachycardia Septal infarct , age undetermined Abnormal ECG  Confirmed by Davonna Belling [phone]) on 07/18/2017 2:16:05 PM Also confirmed by Davonna Belling [phone])  on 07/18/2017 3:55:23 PM   Radiology Dg Chest Portable 1 View  Result Date: 07/18/2017 CLINICAL DATA:  Shortness of breath 2 days on home oxygen. EXAM: PORTABLE CHEST 1 VIEW COMPARISON:  01/26/2017 FINDINGS: Lungs are adequately inflated without focal airspace consolidation or effusion. Cardiomediastinal silhouette and remainder of the exam is unchanged. IMPRESSION: No acute cardiopulmonary disease. Electronically Signed   By: Marin Olp M.D.   On: 07/18/2017 16:01    Procedures Procedures (including critical care time)  Medications Ordered in ED Medications  methylPREDNISolone sodium succinate (SOLU-MEDROL) 125 mg/2 mL injection 125 mg (has no administration in time range)  ipratropium (ATROVENT) 0.02 % nebulizer solution (0.5 mg  Given 07/18/17 1347)  albuterol (PROVENTIL, VENTOLIN) (5 MG/ML) 0.5% continuous inhalation solution (  Given 07/18/17 1347)     Initial Impression / Assessment and Plan / ED Course  I have reviewed the triage vital signs and the nursing notes.  Pertinent labs & imaging results that were available during my care of the patient were reviewed by me and considered in my medical decision making (see chart for details).     Patient with shortness of breath.  Likely COPD exacerbation.  Has had some sputum production but not green like when she gets infection per the patient.  Still has very harsh breath sounds after hour-long nebulizer.  Some mild persistent tachycardia but not hypoxic.  She is on chronic oxygen at home.  However she has been having episodes of chest pain with exertion.  Dull in her mid chest.  Between the COPD exacerbation and chest pain I feel patient benefit from admission.  Will admit to hospitalist.  Final Clinical Impressions(s) / ED Diagnoses   Final diagnoses:  COPD exacerbation ([place])  Chest pain, unspecified type    ED Discharge  Orders    None       Davonna Belling, MD 07/18/17 [phone]

## 2017-07-18 NOTE — ED Triage Notes (Signed)
Pt presents for evaluation of sob with hx of copd and chronic bronchitis. On 2L chronic O2. Pt reports worsening SOB x 2 days. Pt is with increased work of breathing.

## 2017-07-19 DIAGNOSIS — E785 Hyperlipidemia, unspecified: Secondary | ICD-10-CM | POA: Diagnosis not present

## 2017-07-19 DIAGNOSIS — J439 Emphysema, unspecified: Secondary | ICD-10-CM | POA: Diagnosis not present

## 2017-07-19 DIAGNOSIS — F17210 Nicotine dependence, cigarettes, uncomplicated: Secondary | ICD-10-CM | POA: Diagnosis not present

## 2017-07-19 DIAGNOSIS — Z9981 Dependence on supplemental oxygen: Secondary | ICD-10-CM | POA: Diagnosis not present

## 2017-07-19 DIAGNOSIS — Z8673 Personal history of transient ischemic attack (TIA), and cerebral infarction without residual deficits: Secondary | ICD-10-CM | POA: Diagnosis not present

## 2017-07-19 DIAGNOSIS — J441 Chronic obstructive pulmonary disease with (acute) exacerbation: Secondary | ICD-10-CM

## 2017-07-19 DIAGNOSIS — Z881 Allergy status to other antibiotic agents status: Secondary | ICD-10-CM | POA: Diagnosis not present

## 2017-07-19 DIAGNOSIS — Z91040 Latex allergy status: Secondary | ICD-10-CM | POA: Diagnosis not present

## 2017-07-19 DIAGNOSIS — Z888 Allergy status to other drugs, medicaments and biological substances status: Secondary | ICD-10-CM | POA: Diagnosis not present

## 2017-07-19 DIAGNOSIS — R69 Illness, unspecified: Secondary | ICD-10-CM | POA: Diagnosis not present

## 2017-07-19 DIAGNOSIS — Z79899 Other long term (current) drug therapy: Secondary | ICD-10-CM | POA: Diagnosis not present

## 2017-07-19 DIAGNOSIS — K219 Gastro-esophageal reflux disease without esophagitis: Secondary | ICD-10-CM | POA: Diagnosis not present

## 2017-07-19 LAB — BASIC METABOLIC PANEL
Anion gap: 10 (ref 5–15)
BUN: 6 mg/dL (ref 6–20)
CALCIUM: 9.4 mg/dL (ref 8.9–10.3)
CO2: 25 mmol/L (ref 22–32)
CREATININE: 0.76 mg/dL (ref 0.44–1.00)
Chloride: 106 mmol/L (ref 101–111)
GFR calc Af Amer: >60 mL/min (ref >60)
Glucose, Bld: 158 mg/dL — ABNORMAL HIGH (ref 65–99)
Potassium: 3.4 mmol/L — ABNORMAL LOW (ref 3.5–5.1)
SODIUM: 141 mmol/L (ref 135–145)

## 2017-07-19 LAB — CBC
HCT: 40.8 % (ref 36.0–46.0)
Hemoglobin: 13 g/dL (ref 12.0–15.0)
MCH: 28.9 pg (ref 26.0–34.0)
MCHC: 31.9 g/dL (ref 30.0–36.0)
MCV: 90.7 fL (ref 78.0–100.0)
Platelets: 280 10*3/uL (ref 150–400)
RBC: 4.5 MIL/uL (ref 3.87–5.11)
RDW: 14.6 % (ref 11.5–15.5)
WBC: 9.2 10*3/uL (ref 4.0–10.5)

## 2017-07-19 LAB — TROPONIN I

## 2017-07-19 LAB — HIV ANTIBODY (ROUTINE TESTING W REFLEX): HIV Screen 4th Generation wRfx: NONREACTIVE

## 2017-07-19 MED ORDER — MOMETASONE FURO-FORMOTEROL FUM 200-5 MCG/ACT IN AERO: 2.0000 | INHALATION_SPRAY | Freq: Two times a day (BID) | RESPIRATORY_TRACT | 0 refills | Status: DC

## 2017-07-19 MED ORDER — SPACER/AERO-HOLDING CHAMBERS DEVI: 0 refills | Status: DC

## 2017-07-19 MED ORDER — ALBUTEROL SULFATE (2.5 MG/3ML) 0.083% IN NEBU: 2.5000 mg | INHALATION_SOLUTION | Freq: Four times a day (QID) | RESPIRATORY_TRACT | 0 refills | Status: DC | PRN

## 2017-07-19 MED ORDER — POTASSIUM CHLORIDE 20 MEQ PO PACK
40.0000 meq | PACK | Freq: Once | ORAL | Status: AC
  Administered 2017-07-19: 40 meq via ORAL
  Filled 2017-07-19: qty 2

## 2017-07-19 MED ORDER — ALBUTEROL SULFATE HFA 108 (90 BASE) MCG/ACT IN AERS: 2.0000 | INHALATION_SPRAY | RESPIRATORY_TRACT | 0 refills | Status: DC | PRN

## 2017-07-19 MED ORDER — TIOTROPIUM BROMIDE MONOHYDRATE 2.5 MCG/ACT IN AERS: 2.0000 | INHALATION_SPRAY | Freq: Every day | RESPIRATORY_TRACT | 0 refills | Status: DC

## 2017-07-19 MED ORDER — PREDNISONE 10 MG PO TABS: 40.0000 mg | ORAL_TABLET | Freq: Every day | ORAL | 0 refills | Status: AC

## 2017-07-19 MED ORDER — DOXYCYCLINE HYCLATE 100 MG PO TABS: 100.0000 mg | ORAL_TABLET | Freq: Two times a day (BID) | ORAL | 0 refills | Status: AC

## 2017-07-19 NOTE — Plan of Care (Signed)
Discussed plan of care with patient.  Patient admits she waited too long before seeking medical attention.  Patient has been experiencing breathing difficulty for almost a month.  Patient displayed good teach back.

## 2017-07-19 NOTE — Care Management (Signed)
CM consult received to find out if prescriptions Spiriva and Dulera were covered by insurance.  Patient states she has been on these prescriptions and they were covered.  She states she has no trouble paying copay and can fill them today.    Pt uses [facility] on [address].  Unable to contact pharmacy staff at this time.

## 2017-07-19 NOTE — Discharge Summary (Signed)
Physician Discharge Summary  Rachel Wang ZDG:[id number] DOB: 05-21-1950 DOA: 07/18/2017  PCP: Lance Sell, NP  Admit date: 07/18/2017 Discharge date: 07/19/2017  Time spent: 35 minutes  Recommendations for Outpatient Follow-up:  1. Follow up outpatient CBC/CMP 2. Ensure follow up with pulmonology  3. Follow up chest discomfort, consider outpatient stress  4. LDL above goal, follow outpatient, consider adjusting statin   Discharge Diagnoses:  Principal Problem:   COPD exacerbation ([place]) Active Problems:   Current every day smoker   Hyperlipidemia   Gastroesophageal reflux disease   Chest pain   Discharge Condition: stable  Diet recommendation: heart healthy  Filed Weights   07/18/17 1732  Weight: 83.9 kg (184 lb 15.5 oz)    History of present illness:   Per HPI Rachel Wang is a 67 y.o. female with medical history significant copd ON HOME OXYGEN,hyperlipidemia, stroke,since emergency Department chief complaint worsening shortness of breath and chest pain. [facility] hospitalists are asked to admit  Information is obtained from the patient and the chart. She states over the last week she's experienced gradual worsening shortness of breath and intermittent chest pain. She states she's used her home nebulizer with little relief. She describes the chest pain is intermittent sharp located left anterior chest worse with coughing and movement. Associated symptoms include more frequent coughing increased sputum production. She states the sputum is thick and white. She denies fever chills headache dizziness syncope or near-syncope. She denies abdominal pain nausea vomiting diarrhea constipation melena bright red blood per rectum. She denies any dysuria hematuria frequency or urgency. She does continue to smoke.  She was admitted for a COPD exacerbation and chest pain.  On hospital day 1, she felt much improved with steroids and breathing treatments.  Asking to go home.  She  was discharged with antibiotics and steroids.  Her work up for CP was negative (suspect 2/2 COPD exacerbation and SOB).  Hospital Course:  #1. COPD with exacerbation. Patient states her trigger is change of weather. Review indicates baseline home oxygen at 2 L and triple inhaler therapy with Spiriva and Dulera. This x-ray as noted above. Oxygen saturation level greater than 90% on 2 L. - satting well for me on RA on day of discharge - continue dulera, spiriva at discharge as well as albuterol prn - discharged with doxycycline and prednisone as well   #2. Chest pain. Likely related to above but patient does have risk factors for cad. Lipidemia tobacco user, hx stroke - troponin negative x 3 - EKG without concerning findings - consider stress test as outpatient   3. Hyperlipidemia -lipid panel - LDL above goal, follow outpatient  -stain  #4. Tobacco user -cessation counseling, continue to discuss as outpatient     Procedures:  none  Consultations:  none  Discharge Exam: Vitals:   07/19/17 0724 07/19/17 0825  BP: 122/77   Pulse: 90   Resp: 16   Temp: 98.4 F (36.9 C)   SpO2: 94% 94%   Asking to go home as soon as I enter room. Feeling better.  Breathing much betteer.   General: No acute distress. Cardiovascular: Heart sounds show a regular rate, and rhythm. No gallops or rubs. No murmurs. No JVD. Lungs: Clear to auscultation bilaterally with good air movement. No rales, rhonchi or wheezes. Abdomen: Soft, nontender, nondistended with normal active bowel sounds. No masses. No hepatosplenomegaly. Neurological: Alert and oriented 3. Moves all extremities 4  Cranial nerves II through XII grossly intact. Skin: Warm and dry. No rashes  or lesions. Extremities: No clubbing or cyanosis. No edema.  Psychiatric: Mood and affect are normal. Insight and judgment are appropraite.  Discharge Instructions   Discharge Instructions    Call MD for:  difficulty breathing,  headache or visual disturbances   Complete by:  As directed    Call MD for:  extreme fatigue   Complete by:  As directed    Call MD for:  persistant dizziness or light-headedness   Complete by:  As directed    Call MD for:  persistant nausea and vomiting   Complete by:  As directed    Call MD for:  severe uncontrolled pain   Complete by:  As directed    Call MD for:  temperature >100.4   Complete by:  As directed    Diet - low sodium heart healthy   Complete by:  As directed    Diet - low sodium heart healthy   Complete by:  As directed    Discharge instructions   Complete by:  As directed    You were seen for a COPD exacerbation.  You've improved with steroids and inhalers and antibiotics.  Take your spiriva daily.  Take your dulera daily.  These are your controller medications and will help keep your COPD under control.  Take albuterol every 4-6 hours for the next 2 days until your symptoms are back to normal, then take your albuterol as needed every 4-6 hours for wheezing or shortness of breath.  I'm prescribing a spacer for you to use with your albuterol.  Please follow up with your primary care doctor this week.  Please call for an appointment with your lung doctor.   Return if you have new, recurrent, or worsening symptoms.  Please ask your PCP to request records from this hospitalization so they know what was done and what the next steps will be.   Increase activity slowly   Complete by:  As directed    Increase activity slowly   Complete by:  As directed      Allergies as of 07/19/2017      Reactions   Levofloxacin Nausea And Vomiting   Tramadol Nausea And Vomiting   Adhesive [tape] Rash, Other (See Comments)   WELTS, also   Latex Rash, Other (See Comments)   WELTS, also      Medication List    STOP taking these medications   ipratropium-albuterol 0.5-2.5 (3) MG/3ML Soln Commonly known as:  DUONEB     TAKE these medications   albuterol (2.5 MG/3ML) 0.083%  nebulizer solution Commonly known as:  PROVENTIL Take 3 mLs (2.5 mg total) by nebulization every 6 (six) hours as needed for wheezing or shortness of breath. What changed:  You were already taking a medication with the same name, and this prescription was added. Make sure you understand how and when to take each.   albuterol 108 (90 Base) MCG/ACT inhaler Commonly known as:  PROVENTIL HFA;VENTOLIN HFA Inhale 2 puffs into the lungs every 4 (four) hours as needed for wheezing or shortness of breath. What changed:  Another medication with the same name was added. Make sure you understand how and when to take each.   aspirin 81 MG chewable tablet Chew 81 mg by mouth daily.   calcium carbonate 500 MG chewable tablet Commonly known as:  TUMS - dosed in mg elemental calcium Chew 1-2 tablets by mouth as needed for indigestion or heartburn.   doxycycline 100 MG tablet Commonly known as:  VIBRA-TABS  Take 1 tablet (100 mg total) by mouth every 12 (twelve) hours for 4 days. (first dose tonight, 6/23 PM)   guaifenesin 100 MG/5ML syrup Commonly known as:  ROBITUSSIN Take 200 mg by mouth 3 (three) times daily as needed (to loosen phlegm).   mometasone-formoterol 200-5 MCG/ACT Aero Commonly known as:  DULERA Inhale 2 puffs into the lungs 2 (two) times daily.   pantoprazole 40 MG tablet Commonly known as:  PROTONIX Take 1 tablet (40 mg total) by mouth daily. What changed:    when to take this  reasons to take this   predniSONE 10 MG tablet Commonly known as:  DELTASONE Take 4 tablets (40 mg total) by mouth daily for 5 days.   simvastatin 40 MG tablet Commonly known as:  ZOCOR Take 1 tablet (40 mg total) by mouth daily. Must keep scheduled appt for future refills What changed:    when to take this  additional instructions   Spacer/Aero-Holding Dorise Bullion Use with albuterol inhaler   Tiotropium Bromide Monohydrate 2.5 MCG/ACT Aers Commonly known as:  SPIRIVA RESPIMAT Inhale 2  puffs into the lungs daily.      Allergies  Allergen Reactions  . Levofloxacin Nausea And Vomiting  . Tramadol Nausea And Vomiting  . Adhesive [Tape] Rash and Other (See Comments)    WELTS, also  . Latex Rash and Other (See Comments)    WELTS, also   Follow-up Information    Lance Sell, NP Follow up.   Specialty:  Nurse Practitioner Why:  Please call for a hospital follow up appointment next week Contact information: [address] [address] [phone]        Brand Males, MD Follow up.   Specialty:  Pulmonary Disease Why:  Please call for a follow up appointment Contact information: [place] [address] [phone]            The results of significant diagnostics from this hospitalization (including imaging, microbiology, ancillary and laboratory) are listed below for reference.    Significant Diagnostic Studies: Dg Chest Portable 1 View  Result Date: 07/18/2017 CLINICAL DATA:  Shortness of breath 2 days on home oxygen. EXAM: PORTABLE CHEST 1 VIEW COMPARISON:  01/26/2017 FINDINGS: Lungs are adequately inflated without focal airspace consolidation or effusion. Cardiomediastinal silhouette and remainder of the exam is unchanged. IMPRESSION: No acute cardiopulmonary disease. Electronically Signed   By: Marin Olp M.D.   On: 07/18/2017 16:01    Microbiology: No results found for this or any previous visit (from the past 240 hour(s)).   Labs: Basic Metabolic Panel: Recent Labs  Lab 07/18/17 1432 07/19/17 0505  NA 143 141  K 3.4* 3.4*  CL 102 106  CO2  --  25  GLUCOSE 144* 158*  BUN <3* 6  CREATININE 0.70 0.76  CALCIUM  --  9.4   Liver Function Tests: No results for input(s): AST, ALT, ALKPHOS, BILITOT, PROT, ALBUMIN in the last 168 hours. No results for input(s): LIPASE, AMYLASE in the last 168 hours. No results for input(s): AMMONIA in the last 168 hours. CBC: Recent Labs  Lab 07/18/17 1420  07/18/17 1432 07/19/17 0505  WBC 7.5  --  9.2  NEUTROABS 5.2  --   --   HGB 13.8 15.0 13.0  HCT 44.2 44.0 40.8  MCV 92.3  --  90.7  PLT 293  --  280   Cardiac Enzymes: Recent Labs  Lab 07/18/17 1730 07/18/17 2105 07/19/17 0505  TROPONINI <0.03 <  0.03 <0.03   BNP: BNP (last 3 results) No results for input(s): BNP in the last 8760 hours.  ProBNP (last 3 results) No results for input(s): PROBNP in the last 8760 hours.  CBG: No results for input(s): GLUCAP in the last 168 hours.     Signed:  Fayrene Helper MD.  [facility] 07/19/2017, 2:11 PM

## 2017-07-20 ENCOUNTER — Telehealth: Payer: Self-pay | Admitting: *Deleted

## 2017-07-20 DIAGNOSIS — R69 Illness, unspecified: Secondary | ICD-10-CM | POA: Diagnosis not present

## 2017-07-20 NOTE — Telephone Encounter (Signed)
Called pt to make hosp f/u appt w/Ashleigh. Pt states she has made appt w/pulmonology instead for 07/21/17 since sice she was having problem w/her COPD.

## 2017-07-21 ENCOUNTER — Encounter: Payer: Self-pay | Admitting: Adult Health

## 2017-07-21 ENCOUNTER — Ambulatory Visit: Payer: Medicare HMO | Admitting: Adult Health

## 2017-07-21 DIAGNOSIS — F17200 Nicotine dependence, unspecified, uncomplicated: Secondary | ICD-10-CM

## 2017-07-21 DIAGNOSIS — J441 Chronic obstructive pulmonary disease with (acute) exacerbation: Secondary | ICD-10-CM

## 2017-07-21 DIAGNOSIS — R69 Illness, unspecified: Secondary | ICD-10-CM | POA: Diagnosis not present

## 2017-07-21 DIAGNOSIS — J449 Chronic obstructive pulmonary disease, unspecified: Secondary | ICD-10-CM | POA: Diagnosis not present

## 2017-07-21 MED ORDER — PREDNISONE 10 MG PO TABS: ORAL_TABLET | ORAL | 0 refills | Status: DC

## 2017-07-21 MED ORDER — SPACER/AERO-HOLDING CHAMBERS DEVI: 0 refills | Status: DC

## 2017-07-21 NOTE — Assessment & Plan Note (Signed)
Recurrent exacerbations in active smoker  Smoking cessation  Needs med compliance - new symbicort /incruse start   Plan  Patient Instructions  Begin Symbicort 2 puffs Twice daily  , rinse after use.  Begin INCRUSE 1 puff daily.  Finish Doxycycline as planned  Prednisone taper over next week.   Robitussin DM As needed  Cough /congestion  Fluids and rest  Work on not smoking .  Follow up with Dr. Chase Caller in 3-4 weeks or Rachel Kozakiewicz NP  Please contact office for sooner follow up if symptoms do not improve or worsen or seek emergency care

## 2017-07-21 NOTE — Progress Notes (Signed)
@Patient  ID: Rachel Wang, female    DOB: 03-31-1950, 67 y.o.   MRN: [id number]  Chief Complaint  Patient presents with  . Follow-up    COPD     Referring provider: Lance Sell, NP  HPI: 67 year old female active smoker followed for moderate COPD with emphysema  TEST  Alpha 1 reported as MM CT chest 12/04/15 showed emphysematous changes and pulmonary scarring w/ no acute process.Did show age advance 3 vessel coronary artery califications. PFT 12/28/15 shows FEV1 63%, ratio 78, FVC 63%, DLCO 37, TLC 67%.  07/21/2017 Follow up : COPD /Post hospital follow up  Patient presents for a follow-up visit.  Patient was admitted few days ago for COPD exacerbation.  She was treated with antibiotics and steroids.  Patient says she is feeling some better.  Continues to have some cough and intermittent wheezing. Patient is prone to frequent exacerbations.  Over the last make 6 months she has had on average and exacerbation each month.  Requiring antibiotics and steroids.  Patient continues to smoke.  Smoking cessation was discussed.  Hospital records were reviewed.  Chest x-ray showed no acute process. Patient is supposed to be on Dulera and Spiriva however it appears that she has not been taking this.  We called her pharmacy and patient has not been filling her prescriptions.  Prescriptions were last filled August 2018. Says she could not afford . She has lot on her right now with [place] sick in [place]. Called pharmacy and Symibicort and Incruse are covered on her formulary .   She is trying to quit smoking .      Allergies  Allergen Reactions  . Levofloxacin Nausea And Vomiting  . Tramadol Nausea And Vomiting  . Adhesive [Tape] Rash and Other (See Comments)    WELTS, also  . Latex Rash and Other (See Comments)    WELTS, also    Immunization History  Administered Date(s) Administered  . Pneumococcal Conjugate-13 04/03/2017    Past Medical History:  Diagnosis Date  .  Bronchitis   . COPD (chronic obstructive pulmonary disease) ([place])   . Emphysema of lung ([place])   . Hyperlipidemia   . Stroke [facility])     Tobacco History: Social History   Tobacco Use  Smoking Status Current Every Day Smoker  . Packs/day: 0.50  . Years: 30.00  . Pack years: 15.00  . Types: Cigarettes  Smokeless Tobacco Never Used   Ready to quit: Yes Counseling given: Yes   Outpatient Encounter Medications as of 07/21/2017  Medication Sig  . albuterol (PROVENTIL HFA;VENTOLIN HFA) 108 (90 Base) MCG/ACT inhaler Inhale 2 puffs into the lungs every 4 (four) hours as needed for wheezing or shortness of breath.  Marland Kitchen albuterol (PROVENTIL) (2.5 MG/3ML) 0.083% nebulizer solution Take 3 mLs (2.5 mg total) by nebulization every 6 (six) hours as needed for wheezing or shortness of breath.  Marland Kitchen aspirin 81 MG chewable tablet Chew 81 mg by mouth daily.  . calcium carbonate (TUMS - DOSED IN MG ELEMENTAL CALCIUM) 500 MG chewable tablet Chew 1-2 tablets by mouth as needed for indigestion or heartburn.  . doxycycline (VIBRA-TABS) 100 MG tablet Take 1 tablet (100 mg total) by mouth every 12 (twelve) hours for 4 days. (first dose tonight, 6/23 PM)  . guaifenesin (ROBITUSSIN) 100 MG/5ML syrup Take 200 mg by mouth 3 (three) times daily as needed (to loosen phlegm).  . pantoprazole (PROTONIX) 40 MG tablet Take 1 tablet (40 mg total) by mouth daily. (Patient taking differently: Take  40 mg by mouth daily as needed (for indigestion). )  . predniSONE (DELTASONE) 10 MG tablet Take 4 tablets (40 mg total) by mouth daily for 5 days.  . simvastatin (ZOCOR) 40 MG tablet Take 1 tablet (40 mg total) by mouth daily. Must keep scheduled appt for future refills (Patient taking differently: Take 40 mg by mouth at bedtime. )  . mometasone-formoterol (DULERA) 200-5 MCG/ACT AERO Inhale 2 puffs into the lungs 2 (two) times daily. (Patient not taking: Reported on 07/21/2017)  . predniSONE (DELTASONE) 10 MG tablet 4 tabs for 2 days,  then 3 tabs for 2 days, 2 tabs for 2 days, then 1 tab for 2 days, then stop  . Spacer/Aero-Holding Dorise Bullion Use with albuterol inhaler (Patient not taking: Reported on 07/21/2017)  . Tiotropium Bromide Monohydrate (SPIRIVA RESPIMAT) 2.5 MCG/ACT AERS Inhale 2 puffs into the lungs daily. (Patient not taking: Reported on 07/21/2017)   No facility-administered encounter medications on file as of 07/21/2017.      Review of Systems  Constitutional:   No  weight loss, night sweats,  Fevers, chills, + fatigue, or  lassitude.  HEENT:   No headaches,  Difficulty swallowing,  Tooth/dental problems, or  Sore throat,                No sneezing, itching, ear ache, + nasal congestion, post nasal drip,   CV:  No chest pain,  Orthopnea, PND, swelling in lower extremities, anasarca, dizziness, palpitations, syncope.   GI  No heartburn, indigestion, abdominal pain, nausea, vomiting, diarrhea, change in bowel habits, loss of appetite, bloody stools.   Resp:    No chest wall deformity  Skin: no rash or lesions.  GU: no dysuria, change in color of urine, no urgency or frequency.  No flank pain, no hematuria   MS:  No joint pain or swelling.  No decreased range of motion.  No back pain.    Physical Exam  BP 120/82 (BP Location: Left Arm, Cuff Size: Normal)   Pulse 95   Ht 5\' 5"  (1.651 m)   Wt 183 lb (83 kg)   SpO2 95%   BMI 30.45 kg/m   GEN: A/Ox3; pleasant , NAD, obese    HEENT:  [place]/AT,  EACs-clear, TMs-wnl, NOSE-clear, THROAT-clear, no lesions, no postnasal drip or exudate noted.   NECK:  Supple w/ fair ROM; no JVD; normal carotid impulses w/o bruits; no thyromegaly or nodules palpated; no lymphadenopathy.    RESP  Few trace exp wheezes  no accessory muscle use, no dullness to percussion  CARD:  RRR, no m/r/g, tr peripheral edema, pulses intact, no cyanosis or clubbing.  GI:   Soft & nt; nml bowel sounds; no organomegaly or masses detected.   Musco: Warm bil, no deformities or joint  swelling noted.   Neuro: alert, no focal deficits noted.    Skin: Warm, no lesions or rashes    Lab Results:  CBC    Component Value Date/Time   WBC 9.2 07/19/2017 0505   RBC 4.50 07/19/2017 0505   HGB 13.0 07/19/2017 0505   HCT 40.8 07/19/2017 0505   PLT 280 07/19/2017 0505   MCV 90.7 07/19/2017 0505   MCH 28.9 07/19/2017 0505   MCHC 31.9 07/19/2017 0505   RDW 14.6 07/19/2017 0505   LYMPHSABS 1.7 07/18/2017 1420   MONOABS 0.4 07/18/2017 1420   EOSABS 0.1 07/18/2017 1420   BASOSABS 0.0 07/18/2017 1420    BMET    Component Value Date/Time   NA  141 07/19/2017 0505   K 3.4 (L) 07/19/2017 0505   CL 106 07/19/2017 0505   CO2 25 07/19/2017 0505   GLUCOSE 158 (H) 07/19/2017 0505   BUN 6 07/19/2017 0505   CREATININE 0.76 07/19/2017 0505   CALCIUM 9.4 07/19/2017 0505   GFRNONAA >60 07/19/2017 0505   GFRAA >60 07/19/2017 0505    BNP No results found for: BNP  ProBNP No results found for: PROBNP  Imaging: Dg Chest Portable 1 View  Result Date: 07/18/2017 CLINICAL DATA:  Shortness of breath 2 days on home oxygen. EXAM: PORTABLE CHEST 1 VIEW COMPARISON:  01/26/2017 FINDINGS: Lungs are adequately inflated without focal airspace consolidation or effusion. Cardiomediastinal silhouette and remainder of the exam is unchanged. IMPRESSION: No acute cardiopulmonary disease. Electronically Signed   By: Marin Olp M.D.   On: 07/18/2017 16:01     Assessment & Plan:   COPD exacerbation (HCC) Recurrent exacerbations in active smoker  Smoking cessation  Needs med compliance - new symbicort /incruse start   Plan  Patient Instructions  Begin Symbicort 2 puffs Twice daily  , rinse after use.  Begin INCRUSE 1 puff daily.  Finish Doxycycline as planned  Prednisone taper over next week.   Robitussin DM As needed  Cough /congestion  Fluids and rest  Work on not smoking .  Follow up with Dr. Chase Caller in 3-4 weeks or Parrett NP  Please contact office for sooner follow up if  symptoms do not improve or worsen or seek emergency care        Current every day smoker Smoking cessation     Rexene Edison, NP 07/21/2017

## 2017-07-21 NOTE — Patient Instructions (Addendum)
Begin Symbicort 2 puffs Twice daily  , rinse after use.  Begin INCRUSE 1 puff daily.  Finish Doxycycline as planned  Prednisone taper over next week.   Robitussin DM As needed  Cough /congestion  Fluids and rest  Work on not smoking .  Follow up with Dr. Chase Caller in 3-4 weeks or Parrett NP  Please contact office for sooner follow up if symptoms do not improve or worsen or seek emergency care

## 2017-07-21 NOTE — Addendum Note (Signed)
Addended by: Madolyn Frieze on: 07/21/2017 11:32 AM   Modules accepted: Orders

## 2017-07-21 NOTE — Assessment & Plan Note (Signed)
Smoking cessation  

## 2017-07-22 ENCOUNTER — Telehealth: Payer: Self-pay | Admitting: Internal Medicine

## 2017-07-22 NOTE — Telephone Encounter (Signed)
CMM Spiriva PA request received. PA completed and sent to [facility].  Junita Push Key: [facility] - PA Case ID: [id number] - Rx #: [id number]  Status Pending Sent to [facility]  DrugSpiriva Respimat 2.5MCG/ACT aerosol  FormAetna Medicare Part D ePA Form  Will route to [place] to follow up

## 2017-07-22 NOTE — Telephone Encounter (Signed)
CMM PA request received. Dulera 200 PA started 07/22/17.  Junita Push KeyMichiel Cowboy - PA Case ID: [id number] - Rx #: [id number]  Status Pending Sent to [facility]  DrugDulera 200-5MCG/ACT aerosol  FormAetna Medicare Part D ePA Form   Will route to [facility] to follow up

## 2017-07-23 ENCOUNTER — Telehealth (HOSPITAL_COMMUNITY): Payer: Self-pay

## 2017-07-23 NOTE — Telephone Encounter (Signed)
Called patient to see if it was a good time to get her scheduled for Pulmonary Rehab. Patient stated not at this time, she is still going through family matters. She also stated that she will give [place] a call when she is available to participate in the program.  Closed referral.

## 2017-07-23 NOTE — Telephone Encounter (Signed)
Per covermymeds.com pt has been approved through 01/26/18.  Called pt's pharmacy and spoke with Tavion checking to see if meds had been filled for pt yet. Per Tavion, meds have been filled and are ready for pt to pick up.  Nothing further needed.

## 2017-07-23 NOTE — Telephone Encounter (Signed)
Per covermymeds.com, pt's dulera has been approved until 01/26/18.  Called pt's pharmacy and spoke with Tavion checking to see if meds had been filled for pt yet. Per Tavion, meds have been filled and are ready for pt to pick up.  Nothing further needed.

## 2017-07-24 NOTE — Telephone Encounter (Signed)
PA submitted on 07-21-2017 for Spiriva Respimat 2.5 inhaler Approvedon July 22 2017 PA Case: [id number], Status:  Approved, Coverage Starts on: 01/25/2017, Coverage Ends on: 01/26/2018.  Questions? Contact [phone]. Nothing further needed.

## 2017-08-10 ENCOUNTER — Encounter: Payer: Self-pay | Admitting: Acute Care

## 2017-08-10 ENCOUNTER — Ambulatory Visit: Payer: Medicare HMO | Admitting: Acute Care

## 2017-08-10 VITALS — BP 90/60 | HR 107 | Temp 98.8°F | Ht 65.0 in | Wt 183.0 lb

## 2017-08-10 DIAGNOSIS — R69 Illness, unspecified: Secondary | ICD-10-CM | POA: Diagnosis not present

## 2017-08-10 DIAGNOSIS — J449 Chronic obstructive pulmonary disease, unspecified: Secondary | ICD-10-CM

## 2017-08-10 DIAGNOSIS — J441 Chronic obstructive pulmonary disease with (acute) exacerbation: Secondary | ICD-10-CM | POA: Diagnosis not present

## 2017-08-10 DIAGNOSIS — F17210 Nicotine dependence, cigarettes, uncomplicated: Secondary | ICD-10-CM

## 2017-08-10 MED ORDER — PREDNISONE 10 MG PO TABS: ORAL_TABLET | ORAL | 0 refills | Status: DC

## 2017-08-10 MED ORDER — MOMETASONE FURO-FORMOTEROL FUM 200-5 MCG/ACT IN AERO: 2.0000 | INHALATION_SPRAY | Freq: Two times a day (BID) | RESPIRATORY_TRACT | 5 refills | Status: DC

## 2017-08-10 MED ORDER — DOXYCYCLINE HYCLATE 100 MG PO TABS: 100.0000 mg | ORAL_TABLET | Freq: Two times a day (BID) | ORAL | 0 refills | Status: DC

## 2017-08-10 MED ORDER — ALBUTEROL SULFATE HFA 108 (90 BASE) MCG/ACT IN AERS: 2.0000 | INHALATION_SPRAY | Freq: Four times a day (QID) | RESPIRATORY_TRACT | 6 refills | Status: DC | PRN

## 2017-08-10 MED ORDER — TIOTROPIUM BROMIDE MONOHYDRATE 2.5 MCG/ACT IN AERS: 2.0000 | INHALATION_SPRAY | Freq: Every day | RESPIRATORY_TRACT | 5 refills | Status: DC

## 2017-08-10 NOTE — Assessment & Plan Note (Signed)
Recurrent flare Not using maintenance inhaler for several days Confused about regular vs maintenance inhalers Continues to smoke Issues with cost of  Plan: QUIT Smoking We will prescribe a rescue inhaler for breakthrough shortness of breath. Use up to 4 times daily for shortness of breath or wheezing.  This is the same medication as you nebs We will prescribe doxycycline 100 mg twice dail x 7 days Wear sun Block while in the sun.Prednisone taper; 10 mg tablets: 4 tabs x 2 days, 3 tabs x 2 days, 2 tabs x 2 days 1 tab x 2 days then stop. We will send in prescriptions for Spiriva Respimat and Dulera. Take Dulera 2 puffs twice daily Rinse mouth after use Spitiva is 2 puffs once daily Follow up with Dr. Chase Caller 09/02/2017 as is scheduled PFT's prior to 8/7 visit with Dr. Chase Caller Note your daily symptoms > remember "red flags" for COPD:  Increase in cough, increase in sputum production, increase in shortness of breath or activity intolerance. If you notice these symptoms, please call to be seen.   Please contact office for sooner follow up if symptoms do not improve or worsen or seek emergency care   Ramaswamy consider daliresp for increasing frequency of exacerbations

## 2017-08-10 NOTE — Patient Instructions (Addendum)
It is good to see you today. We will prescribe a rescue inhaler for breakthrough shortness of breath. Use up to 4 times daily for shortness of breath or wheezing.  This is the same medication as you nebs We will prescribe doxycycline 100 mg twice dail x 7 days Wear sun Block while in the sun.Prednisone taper; 10 mg tablets: 4 tabs x 2 days, 3 tabs x 2 days, 2 tabs x 2 days 1 tab x 2 days then stop. We will send in prescriptions for Spiriva Respimat and Dulera. Take Dulera 2 puffs twice daily Rinse mouth after use Spitiva is 2 puffs once daily Follow up with Dr. Chase Caller 09/02/2017 as is scheduled PFT's prior to 8/7 visit with Dr. Chase Caller Note your daily symptoms > remember "red flags" for COPD:  Increase in cough, increase in sputum production, increase in shortness of breath or activity intolerance. If you notice these symptoms, please call to be seen.   Please contact office for sooner follow up if symptoms do not improve or worsen or seek emergency care   Ramaswamy consider daliresp for increasing frequency of exacerbations

## 2017-08-10 NOTE — Progress Notes (Addendum)
History of Present Illness Rachel Wang is a 67 y.o. female with COPD and emphysema. She is followed by Dr. Chase Caller.  Recent hospital admission for flare 07/18/2017-07/19/2017   08/10/2017  Pt. Was seen for a hospital follow up on 07/21/2017 by Tammy Parrett. She was treated for a COPD exacerbation at that visit with antibiotic and prednisone taper . She was also started on Incruse and Symbicort. She completed her prednisone taper and antibiotics. She presents again today for an acute OV with worsening dyspnea x 4 days. Coughing up green secretions. No fever. Some increase in her wheezing. She is using her nebs as needed for breakthrough dyspnea. She has not had maintenance controller inhaler in several days. She denies fever. She is going out of town x 2 weeks and wanted to make sure she didn't let this get out of hand.She has not had her Symbicort in several days as she ran out of it. She did not pick up her prescriptions due to cost. She is very confused about the difference between maintenance and rescue inhalers.  Test Results: CXR 07/18/2017 Lungs are adequately inflated without focal airspace consolidation or effusion. Cardiomediastinal silhouette and remainder of the exam is unchanged. No acute cardiopulmonary disease. Alpha 1 reported as MM CT chest 12/04/15 showed emphysematous changes and pulmonary scarring w/ no acute process.Did show age advance 3 vessel coronary artery califications. PFT 12/28/15 shows FEV1 63%, ratio 78, FVC 63%, DLCO 37, TLC 67%.   CBC Latest Ref Rng & Units 07/19/2017 07/18/2017 07/18/2017  WBC 4.0 - 10.5 K/uL 9.2 - 7.5  Hemoglobin 12.0 - 15.0 g/dL 13.0 15.0 13.8  Hematocrit 36.0 - 46.0 % 40.8 44.0 44.2  Platelets 150 - 400 K/uL 280 - 293    BMP Latest Ref Rng & Units 07/19/2017 07/18/2017 04/03/2017  Glucose 65 - 99 mg/dL 158(H) 144(H) 89  BUN 6 - 20 mg/dL 6 <3(L) 8  Creatinine 0.44 - 1.00 mg/dL 0.76 0.70 0.80  Sodium 135 - 145 mmol/L 141 143 139    Potassium 3.5 - 5.1 mmol/L 3.4(L) 3.4(L) 3.4(L)  Chloride 101 - 111 mmol/L 106 102 100  CO2 22 - 32 mmol/L 25 - 33(H)  Calcium 8.9 - 10.3 mg/dL 9.4 - 9.8    BNP No results found for: BNP  ProBNP No results found for: PROBNP  PFT    Component Value Date/Time   FEV1PRE 1.24 12/28/2015 1345   FEV1POST 1.30 12/28/2015 1345   FVCPRE 1.66 12/28/2015 1345   FVCPOST 1.68 12/28/2015 1345   TLC 3.52 12/28/2015 1345   DLCOUNC 9.64 12/28/2015 1345   PREFEV1FVCRT 75 12/28/2015 1345   PSTFEV1FVCRT 78 12/28/2015 1345    Dg Chest Portable 1 View  Result Date: 07/18/2017 CLINICAL DATA:  Shortness of breath 2 days on home oxygen. EXAM: PORTABLE CHEST 1 VIEW COMPARISON:  01/26/2017 FINDINGS: Lungs are adequately inflated without focal airspace consolidation or effusion. Cardiomediastinal silhouette and remainder of the exam is unchanged. IMPRESSION: No acute cardiopulmonary disease. Electronically Signed   By: Marin Olp M.D.   On: 07/18/2017 16:01     Past medical hx Past Medical History:  Diagnosis Date  . Bronchitis   . COPD (chronic obstructive pulmonary disease) ([place])   . Emphysema of lung ([place])   . Hyperlipidemia   . Stroke [facility])      Social History   Tobacco Use  . Smoking status: Current Some Day Smoker    Packs/day: 0.50    Years: 30.00    Pack years:  15.00    Types: Cigarettes  . Smokeless tobacco: Never Used  Substance Use Topics  . Alcohol use: No  . Drug use: No    Ms.Eschete reports that she has been smoking cigarettes.  She has a 15.00 pack-year smoking history. She has never used smokeless tobacco. She reports that she does not drink alcohol or use drugs.  Tobacco Cessation: Ready to quit: No Counseling given: Yes I have spent 3 minutes counseling patient on smoking cessation this visit.  Past surgical hx, Family hx, Social hx all reviewed.  Current Outpatient Medications on File Prior to Visit  Medication Sig  . albuterol (PROVENTIL HFA;VENTOLIN  HFA) 108 (90 Base) MCG/ACT inhaler Inhale 2 puffs into the lungs every 4 (four) hours as needed for wheezing or shortness of breath.  Marland Kitchen albuterol (PROVENTIL) (2.5 MG/3ML) 0.083% nebulizer solution Take 3 mLs (2.5 mg total) by nebulization every 6 (six) hours as needed for wheezing or shortness of breath.  Marland Kitchen aspirin 81 MG chewable tablet Chew 81 mg by mouth daily.  . calcium carbonate (TUMS - DOSED IN MG ELEMENTAL CALCIUM) 500 MG chewable tablet Chew 1-2 tablets by mouth as needed for indigestion or heartburn.  . guaifenesin (ROBITUSSIN) 100 MG/5ML syrup Take 200 mg by mouth 3 (three) times daily as needed (to loosen phlegm).  . simvastatin (ZOCOR) 40 MG tablet Take 1 tablet (40 mg total) by mouth daily. Must keep scheduled appt for future refills (Patient taking differently: Take 40 mg by mouth at bedtime. )  . Spacer/Aero-Holding [facility] Use as directed.  . pantoprazole (PROTONIX) 40 MG tablet Take 1 tablet (40 mg total) by mouth daily. (Patient not taking: Reported on 08/10/2017)   No current facility-administered medications on file prior to visit.      Allergies  Allergen Reactions  . Levofloxacin Nausea And Vomiting  . Tramadol Nausea And Vomiting  . Adhesive [Tape] Rash and Other (See Comments)    WELTS, also  . Latex Rash and Other (See Comments)    WELTS, also    Review Of Systems:  Constitutional:   No  weight loss, night sweats,  Fevers, chills, +fatigue, or  lassitude.  HEENT:   No headaches,  Difficulty swallowing,  Tooth/dental problems, or  Sore throat,                No sneezing, itching, ear ache, nasal congestion, post nasal drip,   CV:  No chest pain,  Orthopnea, PND, swelling in lower extremities, anasarca, dizziness, palpitations, syncope.   GI  No heartburn, indigestion, abdominal pain, nausea, vomiting, diarrhea, change in bowel habits, loss of appetite, bloody stools.   Resp: + shortness of breath with exertion or at rest.  + excess mucus, no productive  cough,  No non-productive cough,  No coughing up of blood.  + change in color of mucus.  + wheezing.  No chest wall deformity  Skin: no rash or lesions.  GU: no dysuria, change in color of urine, no urgency or frequency.  No flank pain, no hematuria   MS:  No joint pain or swelling.  No decreased range of motion.  No back pain.  Psych:  No change in mood or affect. No depression or anxiety.  No memory loss.   Vital Signs BP 90/60 (BP Location: Left Arm, Cuff Size: Normal)   Pulse (!) 107   Temp 98.8 F (37.1 C) (Oral)   Ht 5\' 5"  (1.651 m)   Wt 183 lb (83 kg)   SpO2  94%   BMI 30.45 kg/m    Physical Exam:  General- No distress,  A&Ox3, pleasant  ENT: No sinus tenderness, TM clear, pale nasal mucosa, no oral exudate,no post nasal drip, no LAN Cardiac: S1, S2, regular rate and rhythm, no murmur Chest: + wheeze/ no rales/ dullness; no accessory muscle use, no nasal flaring, no sternal retractions Abd.: Soft Non-tender, ND, BS +, Body mass index is 30.45 kg/m. Ext: No clubbing cyanosis, edema Neuro:  normal strength, MAE x 4. A&O x 3 Skin: No rashes, warm and dry, intact no rash or lesions Psych: normal mood and behavior   Assessment/Plan  COPD exacerbation (HCC) Recurrent flare Not using maintenance inhaler for several days Confused about regular vs maintenance inhalers Continues to smoke Issues with cost of  Plan: QUIT Smoking We will prescribe a rescue inhaler for breakthrough shortness of breath. Use up to 4 times daily for shortness of breath or wheezing.  This is the same medication as you nebs We will prescribe doxycycline 100 mg twice dail x 7 days Wear sun Block while in the sun.Prednisone taper; 10 mg tablets: 4 tabs x 2 days, 3 tabs x 2 days, 2 tabs x 2 days 1 tab x 2 days then stop. We will send in prescriptions for Spiriva Respimat and Dulera. Take Dulera 2 puffs twice daily Rinse mouth after use Spitiva is 2 puffs once daily Follow up with Dr. Chase Caller  09/02/2017 as is scheduled PFT's prior to 8/7 visit with Dr. Chase Caller Note your daily symptoms > remember "red flags" for COPD:  Increase in cough, increase in sputum production, increase in shortness of breath or activity intolerance. If you notice these symptoms, please call to be seen.   Please contact office for sooner follow up if symptoms do not improve or worsen or seek emergency care   Ramaswamy consider daliresp for increasing frequency of exacerbations   I spent 5 minutes educating patient on the difference between maintenance and rescue inhalers  Magdalen Spatz, NP 08/10/2017  2:31 PM

## 2017-08-14 DIAGNOSIS — J449 Chronic obstructive pulmonary disease, unspecified: Secondary | ICD-10-CM | POA: Diagnosis not present

## 2017-08-19 DIAGNOSIS — M25561 Pain in right knee: Secondary | ICD-10-CM | POA: Diagnosis not present

## 2017-08-31 ENCOUNTER — Ambulatory Visit (INDEPENDENT_AMBULATORY_CARE_PROVIDER_SITE_OTHER): Payer: Medicare HMO | Admitting: Internal Medicine

## 2017-08-31 DIAGNOSIS — J449 Chronic obstructive pulmonary disease, unspecified: Secondary | ICD-10-CM

## 2017-08-31 LAB — PULMONARY FUNCTION TEST
DL/VA % pred: 95 %
DL/VA: 4.69 ml/min/mmHg/L
DLCO COR: 13.98 ml/min/mmHg
DLCO cor % pred: 54 %
DLCO unc % pred: 53 %
DLCO unc: 13.8 ml/min/mmHg
FEF 25-75 Post: 1.72 L/sec
FEF 25-75 Pre: 1.6 L/sec
FEF2575-%CHANGE-POST: 7 %
FEF2575-%PRED-PRE: 84 %
FEF2575-%Pred-Post: 90 %
FEV1-%Change-Post: 1 %
FEV1-%PRED-PRE: 64 %
FEV1-%Pred-Post: 65 %
FEV1-Post: 1.31 L
FEV1-Pre: 1.29 L
FEV1FVC-%CHANGE-POST: 0 %
FEV1FVC-%Pred-Pre: 109 %
FEV6-%CHANGE-POST: 1 %
FEV6-%PRED-PRE: 60 %
FEV6-%Pred-Post: 61 %
FEV6-PRE: 1.51 L
FEV6-Post: 1.54 L
FEV6FVC-%PRED-PRE: 103 %
FEV6FVC-%Pred-Post: 103 %
FVC-%Change-Post: 1 %
FVC-%PRED-POST: 59 %
FVC-%PRED-PRE: 58 %
FVC-Post: 1.54 L
FVC-Pre: 1.51 L
POST FEV1/FVC RATIO: 85 %
POST FEV6/FVC RATIO: 100 %
Pre FEV1/FVC ratio: 86 %
Pre FEV6/FVC Ratio: 100 %

## 2017-08-31 NOTE — Progress Notes (Signed)
PFT done today. 

## 2017-09-02 ENCOUNTER — Encounter: Payer: Self-pay | Admitting: Internal Medicine

## 2017-09-02 ENCOUNTER — Ambulatory Visit (INDEPENDENT_AMBULATORY_CARE_PROVIDER_SITE_OTHER): Payer: Medicare HMO | Admitting: Internal Medicine

## 2017-09-02 VITALS — BP 114/72 | HR 99 | Ht 65.0 in | Wt 186.4 lb

## 2017-09-02 DIAGNOSIS — J449 Chronic obstructive pulmonary disease, unspecified: Secondary | ICD-10-CM | POA: Diagnosis not present

## 2017-09-02 DIAGNOSIS — R69 Illness, unspecified: Secondary | ICD-10-CM | POA: Diagnosis not present

## 2017-09-02 DIAGNOSIS — R0602 Shortness of breath: Secondary | ICD-10-CM | POA: Diagnosis not present

## 2017-09-02 DIAGNOSIS — R0600 Dyspnea, unspecified: Secondary | ICD-10-CM | POA: Diagnosis not present

## 2017-09-02 DIAGNOSIS — R0689 Other abnormalities of breathing: Secondary | ICD-10-CM

## 2017-09-02 DIAGNOSIS — F17200 Nicotine dependence, unspecified, uncomplicated: Secondary | ICD-10-CM

## 2017-09-02 NOTE — Progress Notes (Signed)
Subjective:     Patient ID: Rachel Wang, female   DOB: [birth year], 67 y.o.   MRN: [id number]  HPI    OV 09/02/2017  Chief Complaint  Patient presents with  . Follow-up    PFT performed 08/31/17.  Pt states she has been doing well since last visit. States she still becomes SOB with exertion.     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 She continues to do well. No interim complaints or exacerbations. COPD cat score is 33 and she is significant amount of symptoms. Although she does feel stable and better. A CT scan of the chest was in 2017 without any cancer. She continues to smoke unable to quit. There are no new issues. She says she cannot do pulmonary rehabilitation because she has to visit her husband was in a nursing home of bilateral strokes. She is happy with her triple inhaler therapy. But on deeper questioning she does admit out of proportion shortness of breath. In fact rated level V out of 5.    CAT COPD Symptom & Quality of Life Score (GSK trademark) 0 is no burden. 5 is highest burden 09/02/2017   Never Cough -> Cough all the time 4  No phlegm in chest -> Chest is full of phlegm 4  No chest tightness -> Chest feels very tight 3  No dyspnea for 1 flight stairs/hill -> Very dyspneic for 1 flight of stairs 5  No limitations for ADL at home -> Very limited with ADL at home 4  Confident leaving home -> Not at all confident leaving home 4  Sleep soundly -> Do not sleep soundly because of lung condition 5  Lots of Energy -> No energy at all 4  TOTAL Score (max 40)  33         Results for BRIHANA, QUICKEL (MRN [id number]) as of 09/02/2017 11:43  Ref. Range 12/28/2015 13:45 08/31/2017 14:23  FEV1-Post Latest Units: L 1.30 1.31  FEV1-%Pred-Post Latest Units: % 63 65   Results for DAWNETTE, MIONE (MRN [id number]) as of 09/02/2017 11:43  Ref. Range 12/28/2015 13:45 08/31/2017 14:23  DLCO unc Latest Units: ml/min/mmHg 9.64 13.80  DLCO unc %  pred Latest Units: % 37 53       has a past medical history of Bronchitis, COPD (chronic obstructive pulmonary disease) ([place]), Emphysema of lung ([place]), Hyperlipidemia, and Stroke ([place]).   reports that she has been smoking cigarettes.  She has a 15.00 pack-year smoking history. She has never used smokeless tobacco.  No past surgical history on file.  Allergies  Allergen Reactions  . Levofloxacin Nausea And Vomiting  . Tramadol Nausea And Vomiting  . Adhesive [Tape] Rash and Other (See Comments)    WELTS, also  . Latex Rash and Other (See Comments)    WELTS, also    Immunization History  Administered Date(s) Administered  . Pneumococcal Conjugate-13 04/03/2017    Family History  Problem Relation Age of Onset  . HIV Father   . Lung cancer Sister   . Lung cancer Paternal Aunt   . Stroke Maternal Grandmother   . Heart attack Maternal Grandfather   . Stroke Paternal Grandmother   . Hypertension Paternal Grandmother   . Heart attack Paternal Grandfather      Current Outpatient Medications:  .  albuterol (PROVENTIL HFA;VENTOLIN HFA) 108 (90 Base) MCG/ACT inhaler, Inhale 2 puffs into the lungs every 4 (four) hours as needed for wheezing or shortness of breath., Disp: 1  Inhaler, Rfl: 0 .  albuterol (PROVENTIL HFA;VENTOLIN HFA) 108 (90 Base) MCG/ACT inhaler, Inhale 2 puffs into the lungs every 6 (six) hours as needed for wheezing or shortness of breath., Disp: 1 Inhaler, Rfl: 6 .  aspirin 81 MG chewable tablet, Chew 81 mg by mouth daily., Disp: , Rfl:  .  calcium carbonate (TUMS - DOSED IN MG ELEMENTAL CALCIUM) 500 MG chewable tablet, Chew 1-2 tablets by mouth as needed for indigestion or heartburn., Disp: , Rfl:  .  guaifenesin (ROBITUSSIN) 100 MG/5ML syrup, Take 200 mg by mouth 3 (three) times daily as needed (to loosen phlegm)., Disp: , Rfl:  .  mometasone-formoterol (DULERA) 200-5 MCG/ACT AERO, Inhale 2 puffs into the lungs 2 (two) times daily., Disp: 1 Inhaler, Rfl: 5 .   pantoprazole (PROTONIX) 40 MG tablet, Take 1 tablet (40 mg total) by mouth daily., Disp: 30 tablet, Rfl: 1 .  simvastatin (ZOCOR) 40 MG tablet, Take 1 tablet (40 mg total) by mouth daily. Must keep scheduled appt for future refills (Patient taking differently: Take 40 mg by mouth at bedtime. ), Disp: 90 tablet, Rfl: 1 .  Spacer/Aero-Holding Chambers DEVI, Use as directed., Disp: 1 each, Rfl: 0 .  Tiotropium Bromide Monohydrate (SPIRIVA RESPIMAT) 2.5 MCG/ACT AERS, Inhale 2 puffs into the lungs daily., Disp: 1 Inhaler, Rfl: 5 .  albuterol (PROVENTIL) (2.5 MG/3ML) 0.083% nebulizer solution, Take 3 mLs (2.5 mg total) by nebulization every 6 (six) hours as needed for wheezing or shortness of breath., Disp: 75 mL, Rfl: 0   Review of Systems     Objective:   Physical Exam  Constitutional: She is oriented to person, place, and time. She appears well-developed and well-nourished. No distress.  HENT:  Head: Normocephalic and atraumatic.  Right Ear: External ear normal.  Left Ear: External ear normal.  Mouth/Throat: Oropharynx is clear and moist. No oropharyngeal exudate.  Eyes: Pupils are equal, round, and reactive to light. Conjunctivae and EOM are normal. Right eye exhibits no discharge. Left eye exhibits no discharge. No scleral icterus.  Neck: Normal range of motion. Neck supple. No JVD present. No tracheal deviation present. No thyromegaly present.  Cardiovascular: Normal rate, regular rhythm, normal heart sounds and intact distal pulses. Exam reveals no gallop and no friction rub.  No murmur heard. Pulmonary/Chest: Effort normal and breath sounds normal. No respiratory distress. She has no wheezes. She has no rales. She exhibits no tenderness.  Noisy chest  Abdominal: Soft. Bowel sounds are normal. She exhibits no distension and no mass. There is no tenderness. There is no rebound and no guarding.  Musculoskeletal: Normal range of motion. She exhibits no edema or tenderness.  Lymphadenopathy:     She has no cervical adenopathy.  Neurological: She is alert and oriented to person, place, and time. She has normal reflexes. No cranial nerve deficit. She exhibits normal muscle tone. Coordination normal.  Skin: Skin is warm and dry. No rash noted. She is not diaphoretic. No erythema. No pallor.  Psychiatric: She has a normal mood and affect. Her behavior is normal. Judgment and thought content normal.  Vitals reviewed.  Vitals:   09/02/17 1117  BP: 114/72  Pulse: 99  SpO2: 97%  Weight: 186 lb 6.4 oz (84.6 kg)  Height: 5\' 5"  (1.651 m)        Assessment:       ICD-10-CM   1. COPD, moderate ([place]) J44.9   2. Current every day smoker F17.200   3. Dyspnea and respiratory abnormalities R06.00  R06.89        Plan:     COPD, moderate ([place])  - stable disease - continue spiriva and dulera daily - albuterol as needed  - flu shot in fall   Current every day smoker  - quit smoking  - give accurate smokig history to my CMA  Shortness of breath  get CT chest wo contrast for out of prooprotion shortness of beath - do next few weeks   Followup 6 months or sooner if needed   Dr. Brand Males, M.D., [facility].C.P Pulmonary and Critical Care Medicine Staff Physician, [place] Director - Interstitial Lung Disease  Program  Pulmonary [place] [facility] [place], [place], [address]  Pager: [phone], If no answer or between  15:00h - 7:00h: call [phone] Telephone: [phone]

## 2017-09-02 NOTE — Patient Instructions (Addendum)
COPD, moderate ([place])  - stable disease - continue spiriva and dulera daily - albuterol as needed  - flu shot in fall   Current every day smoker  - quit smoking  - give accurate smokig history to my CMA  Shortness of breath  get CT chest wo contrast for out of prooprotion shortness of beath - do next few weeks   Followup 6 months or sooner if needed

## 2017-09-14 DIAGNOSIS — J449 Chronic obstructive pulmonary disease, unspecified: Secondary | ICD-10-CM | POA: Diagnosis not present

## 2017-09-18 ENCOUNTER — Ambulatory Visit (INDEPENDENT_AMBULATORY_CARE_PROVIDER_SITE_OTHER)
Admission: RE | Admit: 2017-09-18 | Discharge: 2017-09-18 | Disposition: A | Discharge status: Home / Self Care | Payer: Medicare HMO | Source: Ambulatory Visit | Attending: Internal Medicine | Admitting: Internal Medicine

## 2017-09-18 DIAGNOSIS — R0602 Shortness of breath: Secondary | ICD-10-CM

## 2017-09-18 DIAGNOSIS — J439 Emphysema, unspecified: Secondary | ICD-10-CM | POA: Diagnosis not present

## 2017-10-02 ENCOUNTER — Telehealth: Payer: Self-pay | Admitting: Internal Medicine

## 2017-10-02 DIAGNOSIS — R0602 Shortness of breath: Secondary | ICD-10-CM

## 2017-10-02 DIAGNOSIS — I2510 Atherosclerotic heart disease of native coronary artery without angina pectoris: Secondary | ICD-10-CM

## 2017-10-02 DIAGNOSIS — I2584 Coronary atherosclerosis due to calcified coronary lesion: Secondary | ICD-10-CM

## 2017-10-02 NOTE — Telephone Encounter (Signed)
Spoke with pt, aware of results/recs.  Referral placed to Dr. Einar Gip.  Nothing further needed.

## 2017-10-02 NOTE — Telephone Encounter (Signed)
We did CT scan for out of proportion dyspnea. Shows emphysema which we already knoew but also   does have coronary artery calcification - donot see stress test in file -> please refer to cardiologist  Dr Einar Gip,

## 2017-10-15 DIAGNOSIS — J449 Chronic obstructive pulmonary disease, unspecified: Secondary | ICD-10-CM | POA: Diagnosis not present

## 2017-11-14 DIAGNOSIS — J449 Chronic obstructive pulmonary disease, unspecified: Secondary | ICD-10-CM | POA: Diagnosis not present

## 2017-11-19 DIAGNOSIS — Z720 Tobacco use: Secondary | ICD-10-CM | POA: Diagnosis not present

## 2017-11-19 DIAGNOSIS — R0609 Other forms of dyspnea: Secondary | ICD-10-CM | POA: Diagnosis not present

## 2017-11-19 DIAGNOSIS — I209 Angina pectoris, unspecified: Secondary | ICD-10-CM | POA: Diagnosis not present

## 2017-11-19 DIAGNOSIS — R69 Illness, unspecified: Secondary | ICD-10-CM | POA: Diagnosis not present

## 2017-11-19 DIAGNOSIS — I2510 Atherosclerotic heart disease of native coronary artery without angina pectoris: Secondary | ICD-10-CM | POA: Diagnosis not present

## 2017-12-10 ENCOUNTER — Ambulatory Visit (INDEPENDENT_AMBULATORY_CARE_PROVIDER_SITE_OTHER): Payer: Medicare HMO | Admitting: Pulmonary Disease

## 2017-12-10 ENCOUNTER — Encounter: Payer: Self-pay | Admitting: Pulmonary Disease

## 2017-12-10 VITALS — BP 98/60 | HR 90 | Temp 99.7°F | Ht 65.0 in | Wt 181.8 lb

## 2017-12-10 DIAGNOSIS — K219 Gastro-esophageal reflux disease without esophagitis: Secondary | ICD-10-CM | POA: Diagnosis not present

## 2017-12-10 DIAGNOSIS — J432 Centrilobular emphysema: Secondary | ICD-10-CM

## 2017-12-10 DIAGNOSIS — Z720 Tobacco use: Secondary | ICD-10-CM | POA: Insufficient documentation

## 2017-12-10 DIAGNOSIS — R69 Illness, unspecified: Secondary | ICD-10-CM | POA: Diagnosis not present

## 2017-12-10 DIAGNOSIS — F17210 Nicotine dependence, cigarettes, uncomplicated: Secondary | ICD-10-CM

## 2017-12-10 MED ORDER — PANTOPRAZOLE SODIUM 40 MG PO TBEC: 40.0000 mg | DELAYED_RELEASE_TABLET | Freq: Every day | ORAL | 1 refills | Status: DC

## 2017-12-10 MED ORDER — METHYLPREDNISOLONE ACETATE 80 MG/ML IJ SUSP
80.0000 mg | Freq: Once | INTRAMUSCULAR | Status: AC
  Administered 2017-12-10: 80 mg via INTRAMUSCULAR

## 2017-12-10 MED ORDER — FLUTICASONE-UMECLIDIN-VILANT 100-62.5-25 MCG/INH IN AEPB: 1.0000 | INHALATION_SPRAY | Freq: Every day | RESPIRATORY_TRACT | 3 refills | Status: DC

## 2017-12-10 MED ORDER — IPRATROPIUM-ALBUTEROL 0.5-2.5 (3) MG/3ML IN SOLN
3.0000 mL | Freq: Once | RESPIRATORY_TRACT | Status: AC
  Administered 2017-12-10: 3 mL via RESPIRATORY_TRACT

## 2017-12-10 MED ORDER — IPRATROPIUM-ALBUTEROL 0.5-2.5 (3) MG/3ML IN SOLN: 3.0000 mL | Freq: Four times a day (QID) | RESPIRATORY_TRACT | Status: DC

## 2017-12-10 MED ORDER — PREDNISONE 10 MG PO TABS: ORAL_TABLET | ORAL | 0 refills | Status: DC

## 2017-12-10 MED ORDER — FLUTICASONE-UMECLIDIN-VILANT 100-62.5-25 MCG/INH IN AEPB: 1.0000 | INHALATION_SPRAY | Freq: Every day | RESPIRATORY_TRACT | 0 refills | Status: DC

## 2017-12-10 MED ORDER — DOXYCYCLINE HYCLATE 100 MG PO TABS: 100.0000 mg | ORAL_TABLET | Freq: Two times a day (BID) | ORAL | 0 refills | Status: DC

## 2017-12-10 NOTE — Progress Notes (Signed)
@Patient  ID: Rachel Wang, female    DOB: 02-Sep-1950, 67 y.o.   MRN: [id number]  Chief Complaint  Patient presents with  . Acute Visit    Cough     Referring provider: Lance Sell, NP  HPI:  67 year old female current every day smoker followed in our office for emphysema  PMH: GERD, hyperlipidemia Smoker/ Smoking History: Current every day smoker.  15-pack-year smoking history. Maintenance: Dulera 200, Spiriva 2.5 Pt of: Dr. Chase Caller  12/10/2017  - Visit   67 year old female patient presenting today for acute visit.  Patient reports that for the last week she is had an increased productive cough with yellow-green mucus.  Cough is worse in the morning.  Patient reports that baseline cough when stable was clear white mucus.  Patient reports that she is had subjective fevers but no recorded temperatures.  Patient reports increased shortness of breath and fatigue.  Patient has been without any of her controller therapies or inhalers for the last 2 to 3 weeks patient has not contacted our office to let us know this.  Patient reports she cannot afford her medications due to her husband passing last month.  Patient continues to smoke 10 cigarettes a day.  Patient is requesting samples of inhalers today.  Patient is also requesting a flu vaccine today.  Patient continues to use 2 L of oxygen at night.  MMRC - Breathlessness Score 4 - I am too breathless to leave the house or I am breathlessness when dressing    Tests:  09/18/2017-CT chest without contrast-diffuse bronchial wall thickening and mild centrilobular and paraseptal emphysema, few scattered areas of bronchiectasis, multiple tiny pulmonary nodules in lungs bilaterally measuring 5 mm or less, stable from prior study  08/31/2017-pulmonary function test- FVC 1.51 (58% predicted), postbronchodilator ratio 85, FEV1 65, no bronchodilator response, DLCO 54  FENO:  No results found for: NITRICOXIDE  PFT: PFT Results Latest  Ref Rng & Units 08/31/2017 12/28/2015  FVC-Pre L 1.51 1.66  FVC-Predicted Pre % 58 63  FVC-Post L 1.54 1.68  FVC-Predicted Post % 59 63  Pre FEV1/FVC % % 86 75  Post FEV1/FCV % % 85 78  FEV1-Pre L 1.29 1.24  FEV1-Predicted Pre % 64 60  FEV1-Post L 1.31 1.30  DLCO UNC% % 53 37  DLCO COR %Predicted % 95 78  TLC L - 3.52  TLC % Predicted % - 67  RV % Predicted % - 89    Imaging: No results found.  Chart Review:    Specialty Problems      Pulmonary Problems   COPD exacerbation (HCC)   COPD, moderate (HCC)   COPD with acute exacerbation (HCC)   Centrilobular emphysema ([place])    09/18/2017-CT chest without contrast-diffuse bronchial wall thickening and mild centrilobular and paraseptal emphysema, few scattered areas of bronchiectasis, multiple tiny pulmonary nodules in lungs bilaterally measuring 5 mm or less, stable from prior study  08/31/2017-pulmonary function test- FVC 1.51 (58% predicted), postbronchodilator ratio 85, FEV1 65, no bronchodilator response, DLCO 54         Allergies  Allergen Reactions  . Levofloxacin Nausea And Vomiting  . Tramadol Nausea And Vomiting  . Adhesive [Tape] Rash and Other (See Comments)    WELTS, also  . Latex Rash and Other (See Comments)    WELTS, also    Immunization History  Administered Date(s) Administered  . Pneumococcal Conjugate-13 04/03/2017   >>> Patient needs flu vaccine as well as pneumonia vaccine can consider next office visit  when stable  Past Medical History:  Diagnosis Date  . Bronchitis   . COPD (chronic obstructive pulmonary disease) ([place])   . Emphysema of lung ([place])   . Hyperlipidemia   . Stroke [facility])     Tobacco History: Social History   Tobacco Use  Smoking Status Current Some Day Smoker  . Packs/day: 0.75  . Years: 30.00  . Pack years: 22.50  . Types: Cigarettes  Smokeless Tobacco Never Used  Tobacco Comment   10 cigs a day now   Ready to quit: Yes Counseling given: Yes Comment: 10 cigs a day  now   Smoking assessment and cessation counseling  Patient currently smoking: 10 cigarettes a day I have advised the patient to quit/stop smoking as soon as possible due to high risk for multiple medical problems.  It will also be very difficult for [place] to manage patient's  respiratory symptoms and status if we continue to expose her lungs to a known irritant.  We do not advise e-cigarettes as a form of stopping smoking.  Patient is willing to quit smoking.  Patient has not set quit date  I have advised the patient that we can assist and have options of nicotine replacement therapy, provided smoking cessation education today, provided smoking cessation counseling, and provided cessation resources.  Follow-up next office visit office visit for assessment of smoking cessation.  Smoking cessation counseling advised for: 4 min    Outpatient Encounter Medications as of 12/10/2017  Medication Sig  . atorvastatin (LIPITOR) 40 MG tablet Take 40 mg by mouth daily.  . bisoprolol (ZEBETA) 5 MG tablet Take 2.5 mg by mouth daily.  Marland Kitchen albuterol (PROVENTIL HFA;VENTOLIN HFA) 108 (90 Base) MCG/ACT inhaler Inhale 2 puffs into the lungs every 4 (four) hours as needed for wheezing or shortness of breath. (Patient not taking: Reported on 12/10/2017)  . albuterol (PROVENTIL HFA;VENTOLIN HFA) 108 (90 Base) MCG/ACT inhaler Inhale 2 puffs into the lungs every 6 (six) hours as needed for wheezing or shortness of breath. (Patient not taking: Reported on 12/10/2017)  . albuterol (PROVENTIL) (2.5 MG/3ML) 0.083% nebulizer solution Take 3 mLs (2.5 mg total) by nebulization every 6 (six) hours as needed for wheezing or shortness of breath.  Marland Kitchen aspirin 81 MG chewable tablet Chew 81 mg by mouth daily.  . calcium carbonate (TUMS - DOSED IN MG ELEMENTAL CALCIUM) 500 MG chewable tablet Chew 1-2 tablets by mouth as needed for indigestion or heartburn.  . doxycycline (VIBRA-TABS) 100 MG tablet Take 1 tablet (100 mg total) by mouth  2 (two) times daily.  . Fluticasone-Umeclidin-Vilant (TRELEGY ELLIPTA) 100-62.5-25 MCG/INH AEPB Inhale 1 puff into the lungs daily.  . Fluticasone-Umeclidin-Vilant (TRELEGY ELLIPTA) 100-62.5-25 MCG/INH AEPB Inhale 1 puff into the lungs daily.  Marland Kitchen guaifenesin (ROBITUSSIN) 100 MG/5ML syrup Take 200 mg by mouth 3 (three) times daily as needed (to loosen phlegm).  . mometasone-formoterol (DULERA) 200-5 MCG/ACT AERO Inhale 2 puffs into the lungs 2 (two) times daily. (Patient not taking: Reported on 12/10/2017)  . pantoprazole (PROTONIX) 40 MG tablet Take 1 tablet (40 mg total) by mouth daily.  . predniSONE (DELTASONE) 10 MG tablet 4 tabs for 2 days, then 3 tabs for 2 days, 2 tabs for 2 days, then 1 tab for 2 days, then stop  . simvastatin (ZOCOR) 40 MG tablet Take 1 tablet (40 mg total) by mouth daily. Must keep scheduled appt for future refills (Patient not taking: Reported on 12/10/2017)  . Spacer/Aero-Holding [facility] Use as directed. (Patient not taking:  Reported on 12/10/2017)  . Tiotropium Bromide Monohydrate (SPIRIVA RESPIMAT) 2.5 MCG/ACT AERS Inhale 2 puffs into the lungs daily. (Patient not taking: Reported on 12/10/2017)  . [DISCONTINUED] pantoprazole (PROTONIX) 40 MG tablet Take 1 tablet (40 mg total) by mouth daily. (Patient not taking: Reported on 12/10/2017)  . [EXPIRED] ipratropium-albuterol (DUONEB) 0.5-2.5 (3) MG/3ML nebulizer solution 3 mL   . [EXPIRED] methylPREDNISolone acetate (DEPO-MEDROL) injection 80 mg   . [DISCONTINUED] ipratropium-albuterol (DUONEB) 0.5-2.5 (3) MG/3ML nebulizer solution 3 mL    No facility-administered encounter medications on file as of 12/10/2017.     Review of Systems  Review of Systems  Constitutional: Positive for activity change, fatigue and fever. Negative for chills and unexpected weight change.  HENT: Positive for congestion and postnasal drip. Negative for ear pain, sinus pressure and sinus pain.   Respiratory: Positive for cough (yellow  green mucous, worse in the morning ), shortness of breath and wheezing. Negative for chest tightness.   Cardiovascular: Negative for chest pain and palpitations.  Gastrointestinal: Negative for blood in stool, diarrhea, nausea and vomiting.       +daily indigestion / heartburn  +epigastric pain   Genitourinary: Negative for dysuria, frequency and urgency.  Musculoskeletal: Negative for arthralgias.  Skin: Negative for color change.  Allergic/Immunologic: Negative for environmental allergies and food allergies.  Neurological: Negative for dizziness, light-headedness and headaches.  Psychiatric/Behavioral: Positive for dysphoric mood. The patient is not nervous/anxious.   All other systems reviewed and are negative.    Physical Exam  BP 98/60 (BP Location: Left Arm, Cuff Size: Normal)   Pulse 90   Temp 99.7 F (37.6 C) (Oral)   Ht 5\' 5"  (1.651 m)   Wt 181 lb 12.8 oz (82.5 kg)   SpO2 95%   BMI 30.25 kg/m   Wt Readings from Last 5 Encounters:  12/10/17 181 lb 12.8 oz (82.5 kg)  09/02/17 186 lb 6.4 oz (84.6 kg)  08/10/17 183 lb (83 kg)  07/21/17 183 lb (83 kg)  07/18/17 184 lb 15.5 oz (83.9 kg)    Physical Exam  Constitutional: She is oriented to person, place, and time and well-developed, well-nourished, and in no distress. No distress.  HENT:  Head: Normocephalic and atraumatic.  Right Ear: Hearing, tympanic membrane, external ear and ear canal normal.  Left Ear: Hearing, external ear and ear canal normal.  Nose: Mucosal edema present. Right sinus exhibits no maxillary sinus tenderness and no frontal sinus tenderness. Left sinus exhibits no maxillary sinus tenderness and no frontal sinus tenderness.  Mouth/Throat: Uvula is midline and oropharynx is clear and moist. No oropharyngeal exudate.  +TMs with effusion bilaterally, +PND   Eyes: Pupils are equal, round, and reactive to light.  Neck: Normal range of motion. Neck supple. No JVD present.  Cardiovascular: Normal rate,  regular rhythm and normal heart sounds.  Pulmonary/Chest: Effort normal. No accessory muscle usage. No respiratory distress. She has no decreased breath sounds. She has wheezes (insp and exp wheezes ). She has no rhonchi.  Abdominal: Soft. Bowel sounds are normal. There is no tenderness.  Musculoskeletal: Normal range of motion. She exhibits no edema.  Lymphadenopathy:    She has no cervical adenopathy.  Neurological: She is alert and oriented to person, place, and time. Gait normal.  Skin: Skin is warm and dry. She is not diaphoretic. No erythema.  Psychiatric: Mood, memory and judgment normal. She has a flat affect.  +tearful at times during exam   Nursing note and vitals reviewed.  Lab Results:  CBC    Component Value Date/Time   WBC 9.2 07/19/2017 0505   RBC 4.50 07/19/2017 0505   HGB 13.0 07/19/2017 0505   HCT 40.8 07/19/2017 0505   PLT 280 07/19/2017 0505   MCV 90.7 07/19/2017 0505   MCH 28.9 07/19/2017 0505   MCHC 31.9 07/19/2017 0505   RDW 14.6 07/19/2017 0505   LYMPHSABS 1.7 07/18/2017 1420   MONOABS 0.4 07/18/2017 1420   EOSABS 0.1 07/18/2017 1420   BASOSABS 0.0 07/18/2017 1420    BMET    Component Value Date/Time   NA 141 07/19/2017 0505   K 3.4 (L) 07/19/2017 0505   CL 106 07/19/2017 0505   CO2 25 07/19/2017 0505   GLUCOSE 158 (H) 07/19/2017 0505   BUN 6 07/19/2017 0505   CREATININE 0.76 07/19/2017 0505   CALCIUM 9.4 07/19/2017 0505   GFRNONAA >60 07/19/2017 0505   GFRAA >60 07/19/2017 0505    BNP No results found for: BNP  ProBNP No results found for: PROBNP    Assessment & Plan:   Pleasant 67 year old female patient completing acute visit today.  Patient with COPD exacerbation.  Treated with DuoNeb nebulized medication, Depo-Medrol 80, will start prednisone taper tomorrow, doxycycline.  Patient to follow-up in 2 to 4 weeks to ensure her symptoms are resolving as well as to readdress smoking cessation.  Discussed smoking sensation today.  We  will also refill patient's Protonix that she can get better control of her GERD.  Patient's inhalers have been switched to Trelegy Ellipta.  Sample provided today.  [place] for me paperwork provided today.  Gastroesophageal reflux disease  I have refilled your Protonix >>>Please make sure you are taking 1 tablet (40 mg total) 30 minutes to an hour before breakfast and your other morning medications >>> Review GERD literature listed below  Follow-up with our office in 2 to 4 weeks   Centrilobular emphysema ([place]) DuoNeb today in office  Depo 80 IM   Doxycycline >>> 1 100 mg tablet every 12 hours for 7 days >>>take with food  >>>wear sunscreen   Prednisone 10mg  tablet  >>>4 tabs for 2 days, then 3 tabs for 2 days, 2 tabs for 2 days, then 1 tab for 2 days, then stop >>>take with food  >>>take in the morning  >>>start this dose on 12/11/17    Start Trelegy Ellipta  >>> 1 puff daily in the morning >>>rinse mouth out after use  >>> This inhaler contains 3 medications that help manage her respiratory status, contact our office if you cannot afford this medication or unable to remain on this medication >>> This medication replaces your Dulera 200 as well as your Spiriva 2.5  Please apply for [place] for me for patient assistance of Trelegy Ellipta as well as for Ventolin HFA rescue inhaler  I have refilled your Protonix >>>Please make sure you are taking 1 tablet (40 mg total) 30 minutes to an hour before breakfast and your other morning medications >>> Review GERD literature listed below  Can use over-the-counter cough medicine for cough suppression  >>> Sample provided today    We recommend that you stop smoking.  >>>You need to set a quit date >>>If you have friends or family who smoke, let them know you are trying to quit and not to smoke around you or in your living environment  Smoking Cessation Resources:  1 800 QUIT NOW  >>> Patient to call this resource and utilize it to  help support her  quit smoking >>> Keep up your hard work with stopping smoking  You can also contact the [facility] >>>For smoking cessation classes call [phone]  We do not recommend using e-cigarettes as a form of stopping smoking  You can sign up for smoking cessation support texts and information:  >>>https://smokefree.gov/smokefreetxt  Can get flu vaccine at next office visit if stable  Follow-up with our office in 2 to 4 weeks   Tobacco abuse disorder We recommend that you stop smoking.  >>>You need to set a quit date >>>If you have friends or family who smoke, let them know you are trying to quit and not to smoke around you or in your living environment  Smoking Cessation Resources:  1 800 QUIT NOW  >>> Patient to call this resource and utilize it to help support her quit smoking >>> Keep up your hard work with stopping smoking  You can also contact the [facility] >>>For smoking cessation classes call [phone]  We do not recommend using e-cigarettes as a form of stopping smoking  You can sign up for smoking cessation support texts and information:  >>>https://smokefree.gov/smokefreetxt       Lauraine Rinne, NP 12/10/2017

## 2017-12-10 NOTE — Assessment & Plan Note (Signed)
DuoNeb today in office  Depo 80 IM   Doxycycline >>> 1 100 mg tablet every 12 hours for 7 days >>>take with food  >>>wear sunscreen   Prednisone 10mg  tablet  >>>4 tabs for 2 days, then 3 tabs for 2 days, 2 tabs for 2 days, then 1 tab for 2 days, then stop >>>take with food  >>>take in the morning  >>>start this dose on 12/11/17    Start Trelegy Ellipta  >>> 1 puff daily in the morning >>>rinse mouth out after use  >>> This inhaler contains 3 medications that help manage her respiratory status, contact our office if you cannot afford this medication or unable to remain on this medication >>> This medication replaces your Dulera 200 as well as your Spiriva 2.5  Please apply for [place] for me for patient assistance of Trelegy Ellipta as well as for Ventolin HFA rescue inhaler  I have refilled your Protonix >>>Please make sure you are taking 1 tablet (40 mg total) 30 minutes to an hour before breakfast and your other morning medications >>> Review GERD literature listed below  Can use over-the-counter cough medicine for cough suppression  >>> Sample provided today    We recommend that you stop smoking.  >>>You need to set a quit date >>>If you have friends or family who smoke, let them know you are trying to quit and not to smoke around you or in your living environment  Smoking Cessation Resources:  1 800 QUIT NOW  >>> Patient to call this resource and utilize it to help support her quit smoking >>> Keep up your hard work with stopping smoking  You can also contact the [facility] >>>For smoking cessation classes call [phone]  We do not recommend using e-cigarettes as a form of stopping smoking  You can sign up for smoking cessation support texts and information:  >>>https://smokefree.gov/smokefreetxt  Can get flu vaccine at next office visit if stable  Follow-up with our office in 2 to 4 weeks

## 2017-12-10 NOTE — Assessment & Plan Note (Signed)
  I have refilled your Protonix >>>Please make sure you are taking 1 tablet (40 mg total) 30 minutes to an hour before breakfast and your other morning medications >>> Review GERD literature listed below  Follow-up with our office in 2 to 4 weeks

## 2017-12-10 NOTE — Assessment & Plan Note (Signed)
We recommend that you stop smoking.  >>>You need to set a quit date >>>If you have friends or family who smoke, let them know you are trying to quit and not to smoke around you or in your living environment  Smoking Cessation Resources:  1 800 QUIT NOW  >>> Patient to call this resource and utilize it to help support her quit smoking >>> Keep up your hard work with stopping smoking  You can also contact the [facility] >>>For smoking cessation classes call [phone]  We do not recommend using e-cigarettes as a form of stopping smoking  You can sign up for smoking cessation support texts and information:  >>>https://smokefree.gov/smokefreetxt

## 2017-12-10 NOTE — Patient Instructions (Addendum)
DuoNeb today in office  Depo 80 IM   Doxycycline >>> 1 100 mg tablet every 12 hours for 7 days >>>take with food  >>>wear sunscreen   Prednisone 10mg  tablet  >>>4 tabs for 2 days, then 3 tabs for 2 days, 2 tabs for 2 days, then 1 tab for 2 days, then stop >>>take with food  >>>take in the morning  >>>start this dose on 12/11/17    Start Trelegy Ellipta  >>> 1 puff daily in the morning >>>rinse mouth out after use  >>> This inhaler contains 3 medications that help manage her respiratory status, contact our office if you cannot afford this medication or unable to remain on this medication >>> This medication replaces your Dulera 200 as well as your Spiriva 2.5  Please apply for [place] for me for patient assistance of Trelegy Ellipta as well as for Ventolin HFA rescue inhaler  I have refilled your Protonix >>>Please make sure you are taking 1 tablet (40 mg total) 30 minutes to an hour before breakfast and your other morning medications >>> Review GERD literature listed below  Can use over-the-counter cough medicine for cough suppression  >>> Sample provided today    We recommend that you stop smoking.  >>>You need to set a quit date >>>If you have friends or family who smoke, let them know you are trying to quit and not to smoke around you or in your living environment  Smoking Cessation Resources:  1 800 QUIT NOW  >>> Patient to call this resource and utilize it to help support her quit smoking >>> Keep up your hard work with stopping smoking  You can also contact the [facility] >>>For smoking cessation classes call [phone]  We do not recommend using e-cigarettes as a form of stopping smoking  You can sign up for smoking cessation support texts and information:  >>>https://smokefree.gov/smokefreetxt    Can get flu vaccine at next office visit if stable  Follow-up with our office in 2 to 4 weeks     November/2019 we will be moving! We will no  longer be at our [place] location.  Be on the look out for a post card/mailer to let you know we have officially moved.  Our new address and phone number will be:  [address] [place], [place] [address] Telephone number: [phone]  It is flu season:   >>>Remember to be washing your hands regularly, using hand sanitizer, be careful to use around herself with has contact with people who are sick will increase her chances of getting sick yourself. >>> Best ways to protect herself from the flu: Receive the yearly flu vaccine, practice good hand hygiene washing with soap and also using hand sanitizer when available, eat a nutritious meals, get adequate rest, hydrate appropriately   Please contact the office if your symptoms worsen or you have concerns that you are not improving.   Thank you for choosing [facility] for your healthcare, and for allowing [place] to partner with you on your healthcare journey. I am thankful to be able to provide care to you today.   [name]    Gastroesophageal Reflux Disease, Adult Normally, food travels down the esophagus and stays in the stomach to be digested. If a person has gastroesophageal reflux disease (GERD), food and stomach acid move back up into the esophagus. When this happens, the esophagus becomes sore and swollen (inflamed). Over time, GERD can make small holes (ulcers) in  the lining of the esophagus. Follow these instructions at home: Diet  Follow a diet as told by your doctor. You may need to avoid foods and drinks such as: ? Coffee and tea (with or without caffeine). ? Drinks that contain alcohol. ? Energy drinks and sports drinks. ? Carbonated drinks or sodas. ? Chocolate and cocoa. ? Peppermint and mint flavorings. ? Garlic and onions. ? Horseradish. ? Spicy and acidic foods, such as peppers, chili powder, curry powder, vinegar, hot sauces, and BBQ sauce. ? Citrus fruit juices and citrus fruits, such as  oranges, lemons, and limes. ? Tomato-based foods, such as red sauce, chili, salsa, and pizza with red sauce. ? Fried and fatty foods, such as donuts, french fries, potato chips, and high-fat dressings. ? High-fat meats, such as hot dogs, rib eye steak, sausage, ham, and bacon. ? High-fat dairy items, such as whole milk, butter, and cream cheese.  Eat small meals often. Avoid eating large meals.  Avoid drinking large amounts of liquid with your meals.  Avoid eating meals during the 2-3 hours before bedtime.  Avoid lying down right after you eat.  Do not exercise right after you eat. General instructions  Pay attention to any changes in your symptoms.  Take over-the-counter and prescription medicines only as told by your doctor. Do not take aspirin, ibuprofen, or other NSAIDs unless your doctor says it is okay.  Do not use any tobacco products, including cigarettes, chewing tobacco, and e-cigarettes. If you need help quitting, ask your doctor.  Wear loose clothes. Do not wear anything tight around your waist.  Raise (elevate) the head of your bed about 6 inches (15 cm).  Try to lower your stress. If you need help doing this, ask your doctor.  If you are overweight, lose an amount of weight that is healthy for you. Ask your doctor about a safe weight loss goal.  Keep all follow-up visits as told by your doctor. This is important. Contact a doctor if:  You have new symptoms.  You lose weight and you do not know why it is happening.  You have trouble swallowing, or it hurts to swallow.  You have wheezing or a cough that keeps happening.  Your symptoms do not get better with treatment.  You have a hoarse voice. Get help right away if:  You have pain in your arms, neck, jaw, teeth, or back.  You feel sweaty, dizzy, or light-headed.  You have chest pain or shortness of breath.  You throw up (vomit) and your throw up looks like blood or coffee grounds.  You pass out  (faint).  Your poop (stool) is bloody or black.  You cannot swallow, drink, or eat. This information is not intended to replace advice given to you by your health care provider. Make sure you discuss any questions you have with your health care provider. Document Released: 07/02/2007 Document Revised: 06/21/2015 Document Reviewed: 05/10/2014 Elsevier Interactive Patient Education  2018 [place] for Gastroesophageal Reflux Disease, Adult When you have gastroesophageal reflux disease (GERD), the foods you eat and your eating habits are very important. Choosing the right foods can help ease your discomfort. What guidelines do I need to follow?  Choose fruits, vegetables, whole grains, and low-fat dairy products.  Choose low-fat meat, fish, and poultry.  Limit fats such as oils, salad dressings, butter, nuts, and avocado.  Keep a food diary. This helps you identify foods that cause symptoms.  Avoid foods that cause  symptoms. These may be different for everyone.  Eat small meals often instead of 3 large meals a day.  Eat your meals slowly, in a place where you are relaxed.  Limit fried foods.  Cook foods using methods other than frying.  Avoid drinking alcohol.  Avoid drinking large amounts of liquids with your meals.  Avoid bending over or lying down until 2-3 hours after eating. What foods are not recommended? These are some foods and drinks that may make your symptoms worse: Vegetables Tomatoes. Tomato juice. Tomato and spaghetti sauce. Chili peppers. Onion and garlic. Horseradish. Fruits Oranges, grapefruit, and lemon (fruit and juice). Meats High-fat meats, fish, and poultry. This includes hot dogs, ribs, ham, sausage, salami, and bacon. Dairy Whole milk and chocolate milk. Sour cream. Cream. Butter. Ice cream. Cream cheese. Drinks Coffee and tea. Bubbly (carbonated) drinks or energy drinks. Condiments Hot sauce. Barbecue  sauce. Sweets/Desserts Chocolate and cocoa. Donuts. Peppermint and spearmint. Fats and Oils High-fat foods. This includes [place] fries and potato chips. Other Vinegar. Strong spices. This includes black pepper, white pepper, red pepper, cayenne, curry powder, cloves, ginger, and chili powder. The items listed above may not be a complete list of foods and drinks to avoid. Contact your dietitian for more information. This information is not intended to replace advice given to you by your health care provider. Make sure you discuss any questions you have with your health care provider. Document Released: 07/15/2011 Document Revised: 06/21/2015 Document Reviewed: 11/17/2012 Elsevier Interactive Patient Education  2017 [place] with Quitting Smoking Quitting smoking is a physical and mental challenge. You will face cravings, withdrawal symptoms, and temptation. Before quitting, work with your health care provider to make a plan that can help you cope. Preparation can help you quit and keep you from giving in. How can I cope with cravings? Cravings usually last for 5-10 minutes. If you get through it, the craving will pass. Consider taking the following actions to help you cope with cravings:  Keep your mouth busy: ? Chew sugar-free gum. ? Suck on hard candies or a straw. ? Brush your teeth.  Keep your hands and body busy: ? Immediately change to a different activity when you feel a craving. ? Squeeze or play with a ball. ? Do an activity or a hobby, like making bead jewelry, practicing needlepoint, or working with wood. ? Mix up your normal routine. ? Take a short exercise break. Go for a quick walk or run up and down stairs. ? Spend time in public places where smoking is not allowed.  Focus on doing something kind or helpful for someone else.  Call a friend or family member to talk during a craving.  Join a support group.  Call a quit line, such as  1-800-QUIT-NOW.  Talk with your health care provider about medicines that might help you cope with cravings and make quitting easier for you.  How can I deal with withdrawal symptoms? Your body may experience negative effects as it tries to get used to not having nicotine in the system. These effects are called withdrawal symptoms. They may include:  Feeling hungrier than normal.  Trouble concentrating.  Irritability.  Trouble sleeping.  Feeling depressed.  Restlessness and agitation.  Craving a cigarette.  To manage withdrawal symptoms:  Avoid places, people, and activities that trigger your cravings.  Remember why you want to quit.  Get plenty of sleep.  Avoid coffee and other caffeinated drinks. These may worsen some  of your symptoms.  How can I handle social situations? Social situations can be difficult when you are quitting smoking, especially in the first few weeks. To manage this, you can:  Avoid parties, bars, and other social situations where people might be smoking.  Avoid alcohol.  Leave right away if you have the urge to smoke.  Explain to your family and friends that you are quitting smoking. Ask for understanding and support.  Plan activities with friends or family where smoking is not an option.  What are some ways I can cope with stress? Wanting to smoke may cause stress, and stress can make you want to smoke. Find ways to manage your stress. Relaxation techniques can help. For example:  Breathe slowly and deeply, in through your nose and out through your mouth.  Listen to soothing, relaxing music.  Talk with a family member or friend about your stress.  Light a candle.  Soak in a bath or take a shower.  Think about a peaceful place.  What are some ways I can prevent weight gain? Be aware that many people gain weight after they quit smoking. However, not everyone does. To keep from gaining weight, have a plan in place before you quit and  stick to the plan after you quit. Your plan should include:  Having healthy snacks. When you have a craving, it may help to: ? Eat plain popcorn, crunchy carrots, celery, or other cut vegetables. ? Chew sugar-free gum.  Changing how you eat: ? Eat small portion sizes at meals. ? Eat 4-6 small meals throughout the day instead of 1-2 large meals a day. ? Be mindful when you eat. Do not watch television or do other things that might distract you as you eat.  Exercising regularly: ? Make time to exercise each day. If you do not have time for a long workout, do short bouts of exercise for 5-10 minutes several times a day. ? Do some form of strengthening exercise, like weight lifting, and some form of aerobic exercise, like running or swimming.  Drinking plenty of water or other low-calorie or no-calorie drinks. Drink 6-8 glasses of water daily, or as much as instructed by your health care provider.  Summary  Quitting smoking is a physical and mental challenge. You will face cravings, withdrawal symptoms, and temptation to smoke again. Preparation can help you as you go through these challenges.  You can cope with cravings by keeping your mouth busy (such as by chewing gum), keeping your body and hands busy, and making calls to family, friends, or a helpline for people who want to quit smoking.  You can cope with withdrawal symptoms by avoiding places where people smoke, avoiding drinks with caffeine, and getting plenty of rest.  Ask your health care provider about the different ways to prevent weight gain, avoid stress, and handle social situations. This information is not intended to replace advice given to you by your health care provider. Make sure you discuss any questions you have with your health care provider. Document Released: 01/11/2016 Document Revised: 01/11/2016 Document Reviewed: 01/11/2016 Elsevier Interactive Patient Education  [employer].

## 2017-12-11 DIAGNOSIS — R0602 Shortness of breath: Secondary | ICD-10-CM | POA: Diagnosis not present

## 2017-12-11 DIAGNOSIS — I209 Angina pectoris, unspecified: Secondary | ICD-10-CM | POA: Diagnosis not present

## 2017-12-11 DIAGNOSIS — R252 Cramp and spasm: Secondary | ICD-10-CM | POA: Diagnosis not present

## 2017-12-14 DIAGNOSIS — Z136 Encounter for screening for cardiovascular disorders: Secondary | ICD-10-CM | POA: Diagnosis not present

## 2017-12-15 DIAGNOSIS — J449 Chronic obstructive pulmonary disease, unspecified: Secondary | ICD-10-CM | POA: Diagnosis not present

## 2018-01-01 DIAGNOSIS — E7800 Pure hypercholesterolemia, unspecified: Secondary | ICD-10-CM | POA: Diagnosis not present

## 2018-01-01 DIAGNOSIS — I2510 Atherosclerotic heart disease of native coronary artery without angina pectoris: Secondary | ICD-10-CM | POA: Diagnosis not present

## 2018-01-04 DIAGNOSIS — I209 Angina pectoris, unspecified: Secondary | ICD-10-CM | POA: Diagnosis not present

## 2018-01-04 DIAGNOSIS — R0602 Shortness of breath: Secondary | ICD-10-CM | POA: Diagnosis not present

## 2018-01-06 NOTE — Progress Notes (Signed)
@Patient  ID: Rachel Wang, female    DOB: 1950-10-01, 67 y.o.   MRN: [id number]  Chief Complaint  Patient presents with  . Follow-up    COPD follow up     Referring provider: Lance Sell, NP  HPI:  67 year old female current every day smoker followed in our office for emphysema  PMH: GERD, hyperlipidemia Smoker/ Smoking History: Current every day smoker. 10 cigarettes daily. 15-pack-year smoking history. Maintenance: Trelegy Ellipta  (prev on dulera 200 and spiriva 2.5) Pt of: Dr. Chase Caller  01/07/2018  - Visit   67 year old female patient presenting today for follow-up visit.  Patient reports the best she is ever felt with her breathing.  Patient reports Trelegy Cleaster Corin has worked wonders for her breathing.  Unfortunately patient has not filled out [place] for me paperwork so I am unsure if the patient will be able to afford this inhaler in the future.  MMRC - Breathlessness Score 1 - I get short of breath when hurrying on level ground or walking up a slight hill  Patient has also successfully weaned down her smoking to only smoking 3 cigarettes total over the last 3 weeks.  Patient reports that she feels better and has also started putting away ashtrays which she had around her house.  Patient was able to go on holiday vacation in [place] and was able to see all of her friends and family and complete all of the task without getting short of breath.  Patient reports is the first time she has been able to do this in over a decade.  Patient still declines flu vaccine today reporting that she had a reaction previously in the past to a flu vaccine which prompted a hospitalization.  She will not receive it today.    Tests:  09/18/2017-CT chest without contrast-diffuse bronchial wall thickening and mild centrilobular and paraseptal emphysema, few scattered areas of bronchiectasis, multiple tiny pulmonary nodules in lungs bilaterally measuring 5 mm or less, stable from prior  study  08/31/2017-pulmonary function test- FVC 1.51 (58% predicted), postbronchodilator ratio 85, FEV1 65, no bronchodilator response, DLCO 54  FENO:  No results found for: NITRICOXIDE  PFT: PFT Results Latest Ref Rng & Units 08/31/2017 12/28/2015  FVC-Pre L 1.51 1.66  FVC-Predicted Pre % 58 63  FVC-Post L 1.54 1.68  FVC-Predicted Post % 59 63  Pre FEV1/FVC % % 86 75  Post FEV1/FCV % % 85 78  FEV1-Pre L 1.29 1.24  FEV1-Predicted Pre % 64 60  FEV1-Post L 1.31 1.30  DLCO UNC% % 53 37  DLCO COR %Predicted % 95 78  TLC L - 3.52  TLC % Predicted % - 67  RV % Predicted % - 89    Imaging: No results found.  Chart Review:    Specialty Problems      Pulmonary Problems   COPD exacerbation (HCC)   COPD, moderate (HCC)   COPD with acute exacerbation (HCC)   Centrilobular emphysema ([place])    09/18/2017-CT chest without contrast-diffuse bronchial wall thickening and mild centrilobular and paraseptal emphysema, few scattered areas of bronchiectasis, multiple tiny pulmonary nodules in lungs bilaterally measuring 5 mm or less, stable from prior study  08/31/2017-pulmonary function test- FVC 1.51 (58% predicted), postbronchodilator ratio 85, FEV1 65, no bronchodilator response, DLCO 54         Allergies  Allergen Reactions  . Levofloxacin Nausea And Vomiting  . Tramadol Nausea And Vomiting  . Adhesive [Tape] Rash and Other (See Comments)    WELTS,  also  . Latex Rash and Other (See Comments)    WELTS, also    Immunization History  Administered Date(s) Administered  . Pneumococcal Conjugate-13 04/03/2017   >>> Refuses flu vaccine today, patient needs Pneumovax in March/2020  Past Medical History:  Diagnosis Date  . Bronchitis   . COPD (chronic obstructive pulmonary disease) ([place])   . Emphysema of lung ([place])   . Hyperlipidemia   . Stroke [facility])     Tobacco History: Social History   Tobacco Use  Smoking Status Current Some Day Smoker  . Packs/day: 0.75  . Years: 30.00  .  Pack years: 22.50  . Types: Cigarettes  Smokeless Tobacco Never Used  Tobacco Comment   10 cigs a day now-2 cigs in 3 weeks time (01/07/18)   Ready to quit: Not Answered Counseling given: Yes Comment: 10 cigs a day now-2 cigs in 3 weeks time (01/07/18)  Smoking assessment and cessation counseling  Patient currently smoking: 2-3 cigarettes over last 3 weeks  I have advised the patient to quit/stop smoking as soon as possible due to high risk for multiple medical problems.  It will also be very difficult for [place] to manage patient's  respiratory symptoms and status if we continue to expose her lungs to a known irritant.  We do not advise e-cigarettes as a form of stopping smoking.  Patient is willing to quit smoking. Has not set quit date.   I have advised the patient that we can assist and have options of nicotine replacement therapy, provided smoking cessation education today, provided smoking cessation counseling, and provided cessation resources.  Follow-up next office visit office visit for assessment of smoking cessation.   Smoking cessation counseling advised for: 5 min     Outpatient Encounter Medications as of 01/07/2018  Medication Sig  . albuterol (PROVENTIL HFA;VENTOLIN HFA) 108 (90 Base) MCG/ACT inhaler Inhale 2 puffs into the lungs every 4 (four) hours as needed for wheezing or shortness of breath.  Marland Kitchen albuterol (PROVENTIL HFA;VENTOLIN HFA) 108 (90 Base) MCG/ACT inhaler Inhale 2 puffs into the lungs every 6 (six) hours as needed for wheezing or shortness of breath.  Marland Kitchen aspirin 81 MG chewable tablet Chew 81 mg by mouth daily.  . bisoprolol (ZEBETA) 5 MG tablet Take 2.5 mg by mouth daily.  . calcium carbonate (TUMS - DOSED IN MG ELEMENTAL CALCIUM) 500 MG chewable tablet Chew 1-2 tablets by mouth as needed for indigestion or heartburn.  . doxycycline (VIBRA-TABS) 100 MG tablet Take 1 tablet (100 mg total) by mouth 2 (two) times daily.  . Fluticasone-Umeclidin-Vilant (TRELEGY  ELLIPTA) 100-62.5-25 MCG/INH AEPB Inhale 1 puff into the lungs daily.  Marland Kitchen guaifenesin (ROBITUSSIN) 100 MG/5ML syrup Take 200 mg by mouth 3 (three) times daily as needed (to loosen phlegm).  . pantoprazole (PROTONIX) 40 MG tablet Take 1 tablet (40 mg total) by mouth daily.  Marland Kitchen Spacer/Aero-Holding [facility] Use as directed.  . [DISCONTINUED] Fluticasone-Umeclidin-Vilant (TRELEGY ELLIPTA) 100-62.5-25 MCG/INH AEPB Inhale 1 puff into the lungs daily.  Marland Kitchen albuterol (PROVENTIL) (2.5 MG/3ML) 0.083% nebulizer solution Take 3 mLs (2.5 mg total) by nebulization every 6 (six) hours as needed for wheezing or shortness of breath.  . Fluticasone-Umeclidin-Vilant (TRELEGY ELLIPTA) 100-62.5-25 MCG/INH AEPB Inhale 1 puff into the lungs daily.  . mometasone-formoterol (DULERA) 200-5 MCG/ACT AERO Inhale 2 puffs into the lungs 2 (two) times daily. (Patient not taking: Reported on 01/07/2018)  . simvastatin (ZOCOR) 40 MG tablet Take 1 tablet (40 mg total) by mouth daily. Must keep  scheduled appt for future refills (Patient not taking: Reported on 01/07/2018)  . Tiotropium Bromide Monohydrate (SPIRIVA RESPIMAT) 2.5 MCG/ACT AERS Inhale 2 puffs into the lungs daily. (Patient not taking: Reported on 01/07/2018)  . [DISCONTINUED] atorvastatin (LIPITOR) 40 MG tablet Take 40 mg by mouth daily.  . [DISCONTINUED] predniSONE (DELTASONE) 10 MG tablet 4 tabs for 2 days, then 3 tabs for 2 days, 2 tabs for 2 days, then 1 tab for 2 days, then stop (Patient not taking: Reported on 01/07/2018)   No facility-administered encounter medications on file as of 01/07/2018.      Review of Systems  Review of Systems  Constitutional: Negative for chills, fatigue, fever and unexpected weight change.  HENT: Negative for congestion, ear pain, sinus pressure and sinus pain.   Respiratory: Positive for cough (Occasional morning cough which has improved over the last 3 to 4 weeks). Negative for chest tightness, shortness of breath and wheezing.     Cardiovascular: Negative for chest pain and palpitations.  Gastrointestinal: Negative for diarrhea, nausea and vomiting.       + Heartburn /epigastric pain  Musculoskeletal: Negative for arthralgias.  Skin: Negative for color change.  Allergic/Immunologic: Negative for environmental allergies and food allergies.  Neurological: Negative for dizziness, light-headedness and headaches.  Psychiatric/Behavioral: Negative for dysphoric mood. The patient is not nervous/anxious.   All other systems reviewed and are negative.    Physical Exam  BP 132/68 (BP Location: Left Arm, Cuff Size: Normal)   Pulse 85   Ht 5\' 5"  (1.651 m)   Wt 182 lb 9.6 oz (82.8 kg)   SpO2 94%   BMI 30.39 kg/m   Wt Readings from Last 5 Encounters:  01/07/18 182 lb 9.6 oz (82.8 kg)  12/10/17 181 lb 12.8 oz (82.5 kg)  09/02/17 186 lb 6.4 oz (84.6 kg)  08/10/17 183 lb (83 kg)  07/21/17 183 lb (83 kg)     Physical Exam  Constitutional: She is oriented to person, place, and time and well-developed, well-nourished, and in no distress. No distress.  HENT:  Head: Normocephalic and atraumatic.  Right Ear: Hearing, tympanic membrane, external ear and ear canal normal.  Left Ear: Hearing, tympanic membrane, external ear and ear canal normal.  Nose: Mucosal edema present. Right sinus exhibits no maxillary sinus tenderness and no frontal sinus tenderness. Left sinus exhibits no maxillary sinus tenderness and no frontal sinus tenderness.  Mouth/Throat: Uvula is midline and oropharynx is clear and moist. No oropharyngeal exudate.  +PND  Eyes: Pupils are equal, round, and reactive to light.  Neck: Normal range of motion. Neck supple. No JVD present.  Cardiovascular: Normal rate, regular rhythm and normal heart sounds.  Pulmonary/Chest: Effort normal. No accessory muscle usage. No respiratory distress. She has no decreased breath sounds. She has no wheezes. She has rhonchi (? scattered rhonchi in bases bilaterally).   Abdominal: Soft. Bowel sounds are normal. There is no abdominal tenderness.  Musculoskeletal: Normal range of motion.        General: No edema.  Lymphadenopathy:    She has no cervical adenopathy.  Neurological: She is alert and oriented to person, place, and time. Gait normal.  Skin: Skin is warm and dry. She is not diaphoretic. No erythema.  Psychiatric: Mood, memory, affect and judgment normal. Her mood appears not anxious. She does not exhibit a depressed mood.  Nursing note and vitals reviewed.     Lab Results:  CBC    Component Value Date/Time   WBC 9.2 07/19/2017 0505  RBC 4.50 07/19/2017 0505   HGB 13.0 07/19/2017 0505   HCT 40.8 07/19/2017 0505   PLT 280 07/19/2017 0505   MCV 90.7 07/19/2017 0505   MCH 28.9 07/19/2017 0505   MCHC 31.9 07/19/2017 0505   RDW 14.6 07/19/2017 0505   LYMPHSABS 1.7 07/18/2017 1420   MONOABS 0.4 07/18/2017 1420   EOSABS 0.1 07/18/2017 1420   BASOSABS 0.0 07/18/2017 1420    BMET    Component Value Date/Time   NA 141 07/19/2017 0505   K 3.4 (L) 07/19/2017 0505   CL 106 07/19/2017 0505   CO2 25 07/19/2017 0505   GLUCOSE 158 (H) 07/19/2017 0505   BUN 6 07/19/2017 0505   CREATININE 0.76 07/19/2017 0505   CALCIUM 9.4 07/19/2017 0505   GFRNONAA >60 07/19/2017 0505   GFRAA >60 07/19/2017 0505    BNP No results found for: BNP  ProBNP No results found for: PROBNP    Assessment & Plan:   Pleasant 67 year old female patient completing follow-up with our office today.  Patient to continue on Trelegy Ellipta.  Sample provided today.  Patient educated on importance of filling on [employer] for me paperwork so patient can be able to afford this inhaler in the future.  Patient to follow-up with our office in 2 to 3 months  Centrilobular emphysema ([place]) [place] for me paperwork today   Trelegy Ellipta  >>> 1 puff daily in the morning >>>rinse mouth out after use  >>> This inhaler contains 3 medications that help manage her respiratory  status, contact our office if you cannot afford this medication or unable to remain on this medication   Note your daily symptoms > remember "red flags" for COPD:   >>>Increase in cough >>>increase in sputum production >>>increase in shortness of breath or activity  intolerance.   If you notice these symptoms, please call the office to be seen.   We recommend that you stop smoking.  >>>You need to set a quit date >>>If you have friends or family who smoke, let them know you are trying to quit and not to smoke around you or in your living environment  Follow up in 2-3 months    Tobacco abuse disorder  We recommend that you stop smoking.  >>>You need to set a quit date >>>If you have friends or family who smoke, let them know you are trying to quit and not to smoke around you or in your living environment  Smoking Cessation Resources:  1 800 QUIT NOW  >>> Patient to call this resource and utilize it to help support her quit smoking >>> Keep up your hard work with stopping smoking  You can also contact the [facility] >>>For smoking cessation classes call [phone]  We do not recommend using e-cigarettes as a form of stopping smoking  You can sign up for smoking cessation support texts and information:  >>>https://smokefree.gov/smokefreetxt   Nicotine Gum:   >>>Smokers who smoke fewer than 25 cigarettes a day should use 2 mg dose  Proper chewing of gum is important for optimal results.   >>>Do not chew gum to rapidly.  Once you start chewing eating tasty peppery taste and slide the gum to your cheek.  When the taste disappears to a few more times.  Repeat this for 30 minutes.  Then discard the gum.  >>>Avoid acidic beverages such as coffee, carbonated beverages before and during gum use.  A soft acidic beverages lower oral pH which cause nicotine to not be  absorbed properly >>>If you chew the gum too quickly or vigorously you could have nausea, vomiting,  abdominal pain, constipation, hiccups, headache, sore jaw, mouth irritation ulcers  Nicotine lozenge: Lozenges are commonly uses short acting NRT product  >>>Smokers who wait more than 30 minutes after awakening to smoke should use 2 mg dose  Can use up to 1 lozenge every 1-2 hours for 6 weeks >>>Total amount of lozenges that can be used per day as 20 >>>Gradually reduce number of lozenges used per day after 2 weeks of use  Place lozenge in mouth and allowed to dissolve for 30 minutes loss and does not need to be chewed  Lozenges have advantages to be able to be used in people with TMG, poor dentition, dentures   Follow up in 2-3 months    Gastroesophageal reflux disease Protonix  40mg  tablet  >>>Please take 1 tablet daily 15 minutes to 30 minutes before your first meal of the day as well as before your other medications >>>Try to take at the same time each day >>>take this medication daily  GERD management: >>>Avoid laying flat until 2 hours after meals >>>Elevate head of the bed including entire chest >>>Reduce size of meals and amount of fat, acid, spices, caffeine and sweets >>>If you are smoking, Please stop! >>>Decrease alcohol consumption >>>Work on maintaining a healthy weight with normal BMI      This appointment was 32 minutes along with over 50% of the time direct face-to-face patient care, assessment, plan of care follow-up   Lauraine Rinne, NP 01/07/2018

## 2018-01-07 ENCOUNTER — Encounter: Payer: Self-pay | Admitting: Pulmonary Disease

## 2018-01-07 ENCOUNTER — Ambulatory Visit: Payer: Medicare HMO | Admitting: Pulmonary Disease

## 2018-01-07 VITALS — BP 132/68 | HR 85 | Ht 65.0 in | Wt 182.6 lb

## 2018-01-07 DIAGNOSIS — J432 Centrilobular emphysema: Secondary | ICD-10-CM | POA: Diagnosis not present

## 2018-01-07 DIAGNOSIS — K219 Gastro-esophageal reflux disease without esophagitis: Secondary | ICD-10-CM

## 2018-01-07 DIAGNOSIS — Z720 Tobacco use: Secondary | ICD-10-CM

## 2018-01-07 DIAGNOSIS — F17210 Nicotine dependence, cigarettes, uncomplicated: Secondary | ICD-10-CM | POA: Diagnosis not present

## 2018-01-07 DIAGNOSIS — R69 Illness, unspecified: Secondary | ICD-10-CM | POA: Diagnosis not present

## 2018-01-07 MED ORDER — FLUTICASONE-UMECLIDIN-VILANT 100-62.5-25 MCG/INH IN AEPB: 1.0000 | INHALATION_SPRAY | Freq: Every day | RESPIRATORY_TRACT | 0 refills | Status: AC

## 2018-01-07 NOTE — Assessment & Plan Note (Signed)
Protonix  40mg  tablet  >>>Please take 1 tablet daily 15 minutes to 30 minutes before your first meal of the day as well as before your other medications >>>Try to take at the same time each day >>>take this medication daily  GERD management: >>>Avoid laying flat until 2 hours after meals >>>Elevate head of the bed including entire chest >>>Reduce size of meals and amount of fat, acid, spices, caffeine and sweets >>>If you are smoking, Please stop! >>>Decrease alcohol consumption >>>Work on maintaining a healthy weight with normal BMI

## 2018-01-07 NOTE — Assessment & Plan Note (Signed)
[place] for me paperwork today   Trelegy Ellipta  >>> 1 puff daily in the morning >>>rinse mouth out after use  >>> This inhaler contains 3 medications that help manage her respiratory status, contact our office if you cannot afford this medication or unable to remain on this medication   Note your daily symptoms > remember "red flags" for COPD:   >>>Increase in cough >>>increase in sputum production >>>increase in shortness of breath or activity  intolerance.   If you notice these symptoms, please call the office to be seen.   We recommend that you stop smoking.  >>>You need to set a quit date >>>If you have friends or family who smoke, let them know you are trying to quit and not to smoke around you or in your living environment  Follow up in 2-3 months

## 2018-01-07 NOTE — Patient Instructions (Addendum)
[place] for me paperwork today     Trelegy Ellipta  >>> 1 puff daily in the morning >>>rinse mouth out after use  >>> This inhaler contains 3 medications that help manage her respiratory status, contact our office if you cannot afford this medication or unable to remain on this medication   Note your daily symptoms > remember "red flags" for COPD:   >>>Increase in cough >>>increase in sputum production >>>increase in shortness of breath or activity  intolerance.   If you notice these symptoms, please call the office to be seen.     We recommend that you stop smoking.  >>>You need to set a quit date >>>If you have friends or family who smoke, let them know you are trying to quit and not to smoke around you or in your living environment  Smoking Cessation Resources:  1 800 QUIT NOW  >>> Patient to call this resource and utilize it to help support her quit smoking >>> Keep up your hard work with stopping smoking  You can also contact the [facility] >>>For smoking cessation classes call [phone]  We do not recommend using e-cigarettes as a form of stopping smoking  You can sign up for smoking cessation support texts and information:  >>>https://smokefree.gov/smokefreetxt   Nicotine Gum:   >>>Smokers who smoke fewer than 25 cigarettes a day should use 2 mg dose  Proper chewing of gum is important for optimal results.   >>>Do not chew gum to rapidly.  Once you start chewing eating tasty peppery taste and slide the gum to your cheek.  When the taste disappears to a few more times.  Repeat this for 30 minutes.  Then discard the gum.  >>>Avoid acidic beverages such as coffee, carbonated beverages before and during gum use.  A soft acidic beverages lower oral pH which cause nicotine to not be absorbed properly >>>If you chew the gum too quickly or vigorously you could have nausea, vomiting, abdominal pain, constipation, hiccups, headache, sore jaw, mouth irritation  ulcers  Nicotine lozenge: Lozenges are commonly uses short acting NRT product  >>>Smokers who wait more than 30 minutes after awakening to smoke should use 2 mg dose  Can use up to 1 lozenge every 1-2 hours for 6 weeks >>>Total amount of lozenges that can be used per day as 20 >>>Gradually reduce number of lozenges used per day after 2 weeks of use  Place lozenge in mouth and allowed to dissolve for 30 minutes loss and does not need to be chewed  Lozenges have advantages to be able to be used in people with TMG, poor dentition, dentures   Follow up in 2-3 months     It is flu season:   >>>Remember to be washing your hands regularly, using hand sanitizer, be careful to use around herself with has contact with people who are sick will increase her chances of getting sick yourself. >>> Best ways to protect herself from the flu: Receive the yearly flu vaccine, practice good hand hygiene washing with soap and also using hand sanitizer when available, eat a nutritious meals, get adequate rest, hydrate appropriately   Please contact the office if your symptoms worsen or you have concerns that you are not improving.   Thank you for choosing [facility] for your healthcare, and for allowing [place] to partner with you on your healthcare journey. I am thankful to be able to provide care to you today.   [name]  Coping with Quitting Smoking Quitting smoking is a physical and mental challenge. You will face cravings, withdrawal symptoms, and temptation. Before quitting, work with your health care provider to make a plan that can help you cope. Preparation can help you quit and keep you from giving in. How can I cope with cravings? Cravings usually last for 5-10 minutes. If you get through it, the craving will pass. Consider taking the following actions to help you cope with cravings:  Keep your mouth busy: ? Chew sugar-free gum. ? Suck on hard candies or a  straw. ? Brush your teeth.  Keep your hands and body busy: ? Immediately change to a different activity when you feel a craving. ? Squeeze or play with a ball. ? Do an activity or a hobby, like making bead jewelry, practicing needlepoint, or working with wood. ? Mix up your normal routine. ? Take a short exercise break. Go for a quick walk or run up and down stairs. ? Spend time in public places where smoking is not allowed.  Focus on doing something kind or helpful for someone else.  Call a friend or family member to talk during a craving.  Join a support group.  Call a quit line, such as 1-800-QUIT-NOW.  Talk with your health care provider about medicines that might help you cope with cravings and make quitting easier for you.  How can I deal with withdrawal symptoms? Your body may experience negative effects as it tries to get used to not having nicotine in the system. These effects are called withdrawal symptoms. They may include:  Feeling hungrier than normal.  Trouble concentrating.  Irritability.  Trouble sleeping.  Feeling depressed.  Restlessness and agitation.  Craving a cigarette.  To manage withdrawal symptoms:  Avoid places, people, and activities that trigger your cravings.  Remember why you want to quit.  Get plenty of sleep.  Avoid coffee and other caffeinated drinks. These may worsen some of your symptoms.  How can I handle social situations? Social situations can be difficult when you are quitting smoking, especially in the first few weeks. To manage this, you can:  Avoid parties, bars, and other social situations where people might be smoking.  Avoid alcohol.  Leave right away if you have the urge to smoke.  Explain to your family and friends that you are quitting smoking. Ask for understanding and support.  Plan activities with friends or family where smoking is not an option.  What are some ways I can cope with stress? Wanting to  smoke may cause stress, and stress can make you want to smoke. Find ways to manage your stress. Relaxation techniques can help. For example:  Breathe slowly and deeply, in through your nose and out through your mouth.  Listen to soothing, relaxing music.  Talk with a family member or friend about your stress.  Light a candle.  Soak in a bath or take a shower.  Think about a peaceful place.  What are some ways I can prevent weight gain? Be aware that many people gain weight after they quit smoking. However, not everyone does. To keep from gaining weight, have a plan in place before you quit and stick to the plan after you quit. Your plan should include:  Having healthy snacks. When you have a craving, it may help to: ? Eat plain popcorn, crunchy carrots, celery, or other cut vegetables. ? Chew sugar-free gum.  Changing how you eat: ? Eat small portion sizes at meals. ?  Eat 4-6 small meals throughout the day instead of 1-2 large meals a day. ? Be mindful when you eat. Do not watch television or do other things that might distract you as you eat.  Exercising regularly: ? Make time to exercise each day. If you do not have time for a long workout, do short bouts of exercise for 5-10 minutes several times a day. ? Do some form of strengthening exercise, like weight lifting, and some form of aerobic exercise, like running or swimming.  Drinking plenty of water or other low-calorie or no-calorie drinks. Drink 6-8 glasses of water daily, or as much as instructed by your health care provider.  Summary  Quitting smoking is a physical and mental challenge. You will face cravings, withdrawal symptoms, and temptation to smoke again. Preparation can help you as you go through these challenges.  You can cope with cravings by keeping your mouth busy (such as by chewing gum), keeping your body and hands busy, and making calls to family, friends, or a helpline for people who want to quit  smoking.  You can cope with withdrawal symptoms by avoiding places where people smoke, avoiding drinks with caffeine, and getting plenty of rest.  Ask your health care provider about the different ways to prevent weight gain, avoid stress, and handle social situations. This information is not intended to replace advice given to you by your health care provider. Make sure you discuss any questions you have with your health care provider. Document Released: 01/11/2016 Document Revised: 01/11/2016 Document Reviewed: 01/11/2016 Elsevier Interactive Patient Education  [employer].

## 2018-01-07 NOTE — Assessment & Plan Note (Signed)
  We recommend that you stop smoking.  >>>You need to set a quit date >>>If you have friends or family who smoke, let them know you are trying to quit and not to smoke around you or in your living environment  Smoking Cessation Resources:  1 800 QUIT NOW  >>> Patient to call this resource and utilize it to help support her quit smoking >>> Keep up your hard work with stopping smoking  You can also contact the [facility] >>>For smoking cessation classes call [phone]  We do not recommend using e-cigarettes as a form of stopping smoking  You can sign up for smoking cessation support texts and information:  >>>https://smokefree.gov/smokefreetxt   Nicotine Gum:   >>>Smokers who smoke fewer than 25 cigarettes a day should use 2 mg dose  Proper chewing of gum is important for optimal results.   >>>Do not chew gum to rapidly.  Once you start chewing eating tasty peppery taste and slide the gum to your cheek.  When the taste disappears to a few more times.  Repeat this for 30 minutes.  Then discard the gum.  >>>Avoid acidic beverages such as coffee, carbonated beverages before and during gum use.  A soft acidic beverages lower oral pH which cause nicotine to not be absorbed properly >>>If you chew the gum too quickly or vigorously you could have nausea, vomiting, abdominal pain, constipation, hiccups, headache, sore jaw, mouth irritation ulcers  Nicotine lozenge: Lozenges are commonly uses short acting NRT product  >>>Smokers who wait more than 30 minutes after awakening to smoke should use 2 mg dose  Can use up to 1 lozenge every 1-2 hours for 6 weeks >>>Total amount of lozenges that can be used per day as 20 >>>Gradually reduce number of lozenges used per day after 2 weeks of use  Place lozenge in mouth and allowed to dissolve for 30 minutes loss and does not need to be chewed  Lozenges have advantages to be able to be used in people with TMG, poor dentition,  dentures   Follow up in 2-3 months

## 2018-01-14 DIAGNOSIS — J449 Chronic obstructive pulmonary disease, unspecified: Secondary | ICD-10-CM | POA: Diagnosis not present

## 2018-01-15 DIAGNOSIS — R0609 Other forms of dyspnea: Secondary | ICD-10-CM | POA: Diagnosis not present

## 2018-01-15 DIAGNOSIS — I209 Angina pectoris, unspecified: Secondary | ICD-10-CM | POA: Diagnosis not present

## 2018-01-15 DIAGNOSIS — I2510 Atherosclerotic heart disease of native coronary artery without angina pectoris: Secondary | ICD-10-CM | POA: Diagnosis not present

## 2018-01-15 DIAGNOSIS — R69 Illness, unspecified: Secondary | ICD-10-CM | POA: Diagnosis not present

## 2018-02-14 DIAGNOSIS — J449 Chronic obstructive pulmonary disease, unspecified: Secondary | ICD-10-CM | POA: Diagnosis not present

## 2018-02-25 DIAGNOSIS — Z803 Family history of malignant neoplasm of breast: Secondary | ICD-10-CM | POA: Diagnosis not present

## 2018-02-25 DIAGNOSIS — Z7982 Long term (current) use of aspirin: Secondary | ICD-10-CM | POA: Diagnosis not present

## 2018-02-25 DIAGNOSIS — E669 Obesity, unspecified: Secondary | ICD-10-CM | POA: Diagnosis not present

## 2018-02-25 DIAGNOSIS — E785 Hyperlipidemia, unspecified: Secondary | ICD-10-CM | POA: Diagnosis not present

## 2018-02-25 DIAGNOSIS — I10 Essential (primary) hypertension: Secondary | ICD-10-CM | POA: Diagnosis not present

## 2018-02-25 DIAGNOSIS — Z7951 Long term (current) use of inhaled steroids: Secondary | ICD-10-CM | POA: Diagnosis not present

## 2018-02-25 DIAGNOSIS — Z6831 Body mass index (BMI) 31.0-31.9, adult: Secondary | ICD-10-CM | POA: Diagnosis not present

## 2018-02-25 DIAGNOSIS — J449 Chronic obstructive pulmonary disease, unspecified: Secondary | ICD-10-CM | POA: Diagnosis not present

## 2018-02-25 DIAGNOSIS — R69 Illness, unspecified: Secondary | ICD-10-CM | POA: Diagnosis not present

## 2018-03-17 DIAGNOSIS — J449 Chronic obstructive pulmonary disease, unspecified: Secondary | ICD-10-CM | POA: Diagnosis not present

## 2018-04-07 NOTE — Progress Notes (Signed)
@Patient  ID: Rachel Wang, female    DOB: October 10, 1950, 68 y.o.   MRN: [id number]  Chief Complaint  Patient presents with  . Follow-up    acute - cough / short of breath     Referring provider: Lance Sell, NP  HPI:  68 year old female current every day smoker followed in our office for emphysema  PMH: GERD, hyperlipidemia Smoker/ Smoking History: Current every day smoker. 10 cigarettes daily. 15-pack-year smoking history. Maintenance: Trelegy Ellipta  (prev on dulera 200 and spiriva 2.5) Pt of: Dr. Chase Caller  04/08/2018  - Visit   68 year old female current every day smoker followed in our office for emphysema.  At last office visit patient was doing quite well was managed on Trelegy Ellipta.  She tolerated this medication well and found significant improvement since switching to it.  Unfortunately since the start of the year the patient has had difficulties affording this medication as she has a high deductible plan and it was going to be over $300 for 1 month supply of Trelegy Ellipta.  The patient reports she has been off of Trelegy Ellipta for the last month.  She has not contacted our office to let us know this.  She reports she cannot afford the 1 month supply of Trelegy Ellipta at the price of $300.  She is wondering if we have samples today.  Patient reports that for the last 1 to 2 weeks she has had increased cough, wheezing, fatigue, productive cough with green mucus.  She denies fevers.  MMRC - Breathlessness Score 4 - I am too breathless to leave the house or I am breathlessness when dressing    Tests:   09/18/2017-CT chest without contrast-diffuse bronchial wall thickening and mild centrilobular and paraseptal emphysema, few scattered areas of bronchiectasis, multiple tiny pulmonary nodules in lungs bilaterally measuring 5 mm or less, stable from prior study  08/31/2017-pulmonary function test- FVC 1.51 (58% predicted), postbronchodilator ratio 85, FEV1 65,  no bronchodilator response, DLCO 54   FENO:  No results found for: NITRICOXIDE  PFT: PFT Results Latest Ref Rng & Units 08/31/2017 12/28/2015  FVC-Pre L 1.51 1.66  FVC-Predicted Pre % 58 63  FVC-Post L 1.54 1.68  FVC-Predicted Post % 59 63  Pre FEV1/FVC % % 86 75  Post FEV1/FCV % % 85 78  FEV1-Pre L 1.29 1.24  FEV1-Predicted Pre % 64 60  FEV1-Post L 1.31 1.30  DLCO UNC% % 53 37  DLCO COR %Predicted % 95 78  TLC L - 3.52  TLC % Predicted % - 67  RV % Predicted % - 89    Imaging: No results found.    Specialty Problems      Pulmonary Problems   COPD exacerbation (HCC)   COPD, moderate ([place])   COPD with acute exacerbation (HCC)   Centrilobular emphysema ([place])    09/18/2017-CT chest without contrast-diffuse bronchial wall thickening and mild centrilobular and paraseptal emphysema, few scattered areas of bronchiectasis, multiple tiny pulmonary nodules in lungs bilaterally measuring 5 mm or less, stable from prior study  08/31/2017-pulmonary function test- FVC 1.51 (58% predicted), postbronchodilator ratio 85, FEV1 65, no bronchodilator response, DLCO 54         Allergies  Allergen Reactions  . Levofloxacin Nausea And Vomiting  . Tramadol Nausea And Vomiting  . Adhesive [Tape] Rash and Other (See Comments)    WELTS, also  . Latex Rash and Other (See Comments)    WELTS, also    Immunization History  Administered Date(s)  Administered  . Pneumococcal Conjugate-13 04/03/2017    Past Medical History:  Diagnosis Date  . Bronchitis   . COPD (chronic obstructive pulmonary disease) ([place])   . Emphysema of lung ([place])   . Hyperlipidemia   . Stroke [facility])     Tobacco History: Social History   Tobacco Use  Smoking Status Current Some Day Smoker  . Packs/day: 0.50  . Years: 51.00  . Pack years: 25.50  . Types: Cigarettes  . Start date: 04/08/1967  Smokeless Tobacco Never Used  Tobacco Comment   10 cigs a day now-2 cigs in 3 weeks time (01/07/18)3-4 per day 04/08/18    Ready to quit: Not Answered Counseling given: Not Answered Comment: 10 cigs a day now-2 cigs in 3 weeks time (01/07/18)3-4 per day 04/08/18  Please stop smoking  Outpatient Encounter Medications as of 04/08/2018  Medication Sig  . albuterol (PROVENTIL HFA;VENTOLIN HFA) 108 (90 Base) MCG/ACT inhaler Inhale 2 puffs into the lungs every 4 (four) hours as needed for wheezing or shortness of breath.  Marland Kitchen albuterol (PROVENTIL HFA;VENTOLIN HFA) 108 (90 Base) MCG/ACT inhaler Inhale 2 puffs into the lungs every 6 (six) hours as needed for wheezing or shortness of breath.  Marland Kitchen aspirin 81 MG chewable tablet Chew 81 mg by mouth daily.  . bisoprolol (ZEBETA) 5 MG tablet Take 2.5 mg by mouth daily.  . calcium carbonate (TUMS - DOSED IN MG ELEMENTAL CALCIUM) 500 MG chewable tablet Chew 1-2 tablets by mouth as needed for indigestion or heartburn.  . guaifenesin (ROBITUSSIN) 100 MG/5ML syrup Take 200 mg by mouth 3 (three) times daily as needed (to loosen phlegm).  . mometasone-formoterol (DULERA) 200-5 MCG/ACT AERO Inhale 2 puffs into the lungs 2 (two) times daily.  . pantoprazole (PROTONIX) 40 MG tablet Take 1 tablet (40 mg total) by mouth daily.  . simvastatin (ZOCOR) 40 MG tablet Take 1 tablet (40 mg total) by mouth daily. Must keep scheduled appt for future refills  . Spacer/Aero-Holding [facility] Use as directed.  Marland Kitchen albuterol (PROVENTIL) (2.5 MG/3ML) 0.083% nebulizer solution Take 3 mLs (2.5 mg total) by nebulization every 6 (six) hours as needed for wheezing or shortness of breath.  Marland Kitchen azithromycin (ZITHROMAX) 250 MG tablet 500mg  (two tablets) today, then 250mg  (1 tablet) for the next 4 days  . doxycycline (VIBRA-TABS) 100 MG tablet Take 1 tablet (100 mg total) by mouth 2 (two) times daily. (Patient not taking: Reported on 04/08/2018)  . Fluticasone-Umeclidin-Vilant (TRELEGY ELLIPTA) 100-62.5-25 MCG/INH AEPB Inhale 1 puff into the lungs daily. (Patient not taking: Reported on 04/08/2018)  .  Fluticasone-Umeclidin-Vilant (TRELEGY ELLIPTA) 100-62.5-25 MCG/INH AEPB Inhale 1 puff into the lungs daily.  . predniSONE (DELTASONE) 10 MG tablet 4 tabs for 2 days, then 3 tabs for 2 days, 2 tabs for 2 days, then 1 tab for 2 days, then stop  . Tiotropium Bromide Monohydrate (SPIRIVA RESPIMAT) 2.5 MCG/ACT AERS Inhale 2 puffs into the lungs daily. (Patient not taking: Reported on 04/08/2018)   No facility-administered encounter medications on file as of 04/08/2018.      Review of Systems  Review of Systems  Constitutional: Positive for fatigue. Negative for chills, fever and unexpected weight change.  HENT: Positive for congestion and postnasal drip. Negative for ear pain, sinus pressure and sinus pain.   Respiratory: Positive for cough, chest tightness and shortness of breath. Negative for wheezing.   Cardiovascular: Negative for chest pain and palpitations.  Gastrointestinal: Negative for diarrhea, nausea and vomiting.  Musculoskeletal: Negative for arthralgias.  Skin: Negative for color change.  Allergic/Immunologic: Negative for environmental allergies and food allergies.  Neurological: Negative for dizziness, light-headedness and headaches.  Psychiatric/Behavioral: Negative for dysphoric mood. The patient is not nervous/anxious.   All other systems reviewed and are negative.    Physical Exam  BP (!) 106/56 (BP Location: Left Arm, Cuff Size: Normal)   Pulse 98   Ht 5\' 5"  (1.651 m)   Wt 191 lb 6.4 oz (86.8 kg)   SpO2 93%   BMI 31.85 kg/m   Wt Readings from Last 5 Encounters:  04/08/18 191 lb 6.4 oz (86.8 kg)  01/07/18 182 lb 9.6 oz (82.8 kg)  12/10/17 181 lb 12.8 oz (82.5 kg)  09/02/17 186 lb 6.4 oz (84.6 kg)  08/10/17 183 lb (83 kg)     Physical Exam  Constitutional: She is oriented to person, place, and time and well-developed, well-nourished, and in no distress. No distress.  Elderly female  HENT:  Head: Normocephalic and atraumatic.  Right Ear: Hearing, tympanic  membrane, external ear and ear canal normal.  Left Ear: Hearing, tympanic membrane, external ear and ear canal normal.  Nose: Nose normal. Right sinus exhibits no maxillary sinus tenderness and no frontal sinus tenderness. Left sinus exhibits no maxillary sinus tenderness and no frontal sinus tenderness.  Mouth/Throat: Uvula is midline and oropharynx is clear and moist. No oropharyngeal exudate.  Eyes: Pupils are equal, round, and reactive to light.  Neck: Normal range of motion. Neck supple.  Cardiovascular: Normal rate, regular rhythm and normal heart sounds.  Pulmonary/Chest: Effort normal. No accessory muscle usage. No respiratory distress. She has no decreased breath sounds. She has wheezes (Expiratory wheeze). She has no rhonchi.  Musculoskeletal: Normal range of motion.  Lymphadenopathy:    She has no cervical adenopathy.  Neurological: She is alert and oriented to person, place, and time. Gait normal.  Skin: Skin is warm and dry. She is not diaphoretic. No erythema.  Psychiatric: Mood, memory, affect and judgment normal.  Nursing note and vitals reviewed.     Lab Results:  CBC    Component Value Date/Time   WBC 9.2 07/19/2017 0505   RBC 4.50 07/19/2017 0505   HGB 13.0 07/19/2017 0505   HCT 40.8 07/19/2017 0505   PLT 280 07/19/2017 0505   MCV 90.7 07/19/2017 0505   MCH 28.9 07/19/2017 0505   MCHC 31.9 07/19/2017 0505   RDW 14.6 07/19/2017 0505   LYMPHSABS 1.7 07/18/2017 1420   MONOABS 0.4 07/18/2017 1420   EOSABS 0.1 07/18/2017 1420   BASOSABS 0.0 07/18/2017 1420    BMET    Component Value Date/Time   NA 141 07/19/2017 0505   K 3.4 (L) 07/19/2017 0505   CL 106 07/19/2017 0505   CO2 25 07/19/2017 0505   GLUCOSE 158 (H) 07/19/2017 0505   BUN 6 07/19/2017 0505   CREATININE 0.76 07/19/2017 0505   CALCIUM 9.4 07/19/2017 0505   GFRNONAA >60 07/19/2017 0505   GFRAA >60 07/19/2017 0505    BNP No results found for: BNP  ProBNP No results found for: PROBNP     Assessment & Plan:     Centrilobular emphysema ([place]) Assessment: August/2019 pulmonary function test shows DLCO 54 08/2017 CT chest shows diffuse bronchial wall thickening mild centrilobular and paraseptal emphysema, few scattered areas of bronchiectasis Patient was maintained well on Trelegy Ellipta >>> Patient has been off of Trelegy Ellipta for the last month due to its cost mMRC 4 today Expiratory wheeze on exam today Patient reports purulent  sputum for last week  Plan: Offered breathing treatment office today patient declined and reports she will do in a soon as she gets home Prednisone taper today Z-Pak today Trelegy Ellipta sample provided to patient today Patient will need to contact our office back within the next week to see if we have additional samples Patient will have to contact her insurance to see why she is having the higher cost I believe this is due to a high deductible Follow-up with our office in 2 weeks, if you cannot afford to come to this office visit and please contact our office via telephone so I can be updated with how you are doing clinically  Tobacco abuse disorder Assessment: Current every day smoking 4 cigarettes a day Not interested in stopping smoking at this time due to high stress due to finances  Plan: We recommend that you stop smoking When you are willing to stop smoking please let us know so we can support you with this     Lauraine Rinne, NP 04/08/2018   This appointment was 28 min long with over 50% of the time in direct face-to-face patient care, assessment, plan of care, and follow-up.

## 2018-04-08 ENCOUNTER — Encounter: Payer: Self-pay | Admitting: Pulmonary Disease

## 2018-04-08 ENCOUNTER — Other Ambulatory Visit: Payer: Self-pay

## 2018-04-08 ENCOUNTER — Ambulatory Visit: Payer: Medicare HMO | Admitting: Pulmonary Disease

## 2018-04-08 VITALS — BP 106/56 | HR 98 | Ht 65.0 in | Wt 191.4 lb

## 2018-04-08 DIAGNOSIS — J432 Centrilobular emphysema: Secondary | ICD-10-CM

## 2018-04-08 DIAGNOSIS — Z720 Tobacco use: Secondary | ICD-10-CM | POA: Diagnosis not present

## 2018-04-08 MED ORDER — AZITHROMYCIN 250 MG PO TABS: ORAL_TABLET | ORAL | 0 refills | Status: DC

## 2018-04-08 MED ORDER — FLUTICASONE-UMECLIDIN-VILANT 100-62.5-25 MCG/INH IN AEPB: 1.0000 | INHALATION_SPRAY | Freq: Every day | RESPIRATORY_TRACT | 0 refills | Status: DC

## 2018-04-08 MED ORDER — PREDNISONE 10 MG PO TABS: ORAL_TABLET | ORAL | 0 refills | Status: DC

## 2018-04-08 NOTE — Patient Instructions (Addendum)
Prednisone 10mg  tablet  >>>4 tabs for 2 days, then 3 tabs for 2 days, 2 tabs for 2 days, then 1 tab for 2 days, then stop >>>take with food  >>>take in the morning   Azithromycin 250mg  tablet  >>>Take 2 tablets (500mg  total) today, and then 1 tablet (250mg ) for the next four days  >>>take with food  >>>can also take probiotic and / or yogurt while on antibiotic   Restart Trelegy Ellipta  >>> 1 puff daily in the morning >>>rinse mouth out after use  >>> This inhaler contains 3 medications that help manage her respiratory status, contact our office if you cannot afford this medication or unable to remain on this medication  Only use your albuterol as a rescue medication to be used if you can't catch your breath by resting or doing a relaxed purse lip breathing pattern.  - The less you use it, the better it will work when you need it. - Ok to use up to 2 puffs  every 4 hours if you must but call for immediate appointment if use goes up over your usual need - Don't leave home without it !!  (think of it like the spare tire for your car)   Call your insurance to ask what your deductible is for medications this year   Call your pharmacy and ask how much you have spent this year to date  >>>Calendar year 2020   Follow up in 2 weeks with [name] FNP    It is flu season:   >>> Best ways to protect herself from the flu: Receive the yearly flu vaccine, practice good hand hygiene washing with soap and also using hand sanitizer when available, eat a nutritious meals, get adequate rest, hydrate appropriately   Please contact the office if your symptoms worsen or you have concerns that you are not improving.   Thank you for choosing [facility] for your healthcare, and for allowing [place] to partner with you on your healthcare journey. I am thankful to be able to provide care to you today.   [name]   Chronic Obstructive Pulmonary Disease Exacerbation Chronic obstructive  pulmonary disease (COPD) is a long-term (chronic) lung problem. In COPD, the flow of air from the lungs is limited. COPD exacerbations are times that breathing gets worse and you need more than your normal treatment. Without treatment, they can be life threatening. If they happen often, your lungs can become more damaged. If your COPD gets worse, your doctor may treat you with:  Medicines.  Oxygen.  Different ways to clear your airway, such as using a mask. Follow these instructions at home: Medicines  Take over-the-counter and prescription medicines only as told by your doctor.  If you take an antibiotic or steroid medicine, do not stop taking the medicine even if you start to feel better.  Keep up with shots (vaccinations) as told by your doctor. Be sure to get a yearly (annual) flu shot. Lifestyle  Do not smoke. If you need help quitting, ask your doctor.  Eat healthy foods.  Exercise regularly.  Get plenty of sleep.  Avoid tobacco smoke and other things that can bother your lungs.  Wash your hands often with soap and water. This will help keep you from getting an infection. If you cannot use soap and water, use hand sanitizer.  During flu season, avoid areas that are crowded with people. General instructions  Drink enough fluid to keep your pee (urine)  clear or pale yellow. Do not do this if your doctor has told you not to.  Use a cool mist machine (vaporizer).  If you use oxygen or a machine that turns medicine into a mist (nebulizer), continue to use it as told.  Follow all instructions for rehabilitation. These are steps you can take to make your body work better.  Keep all follow-up visits as told by your doctor. This is important. Contact a doctor if:  Your COPD symptoms get worse than normal. Get help right away if:  You are short of breath and it gets worse.  You have trouble talking.  You have chest pain.  You cough up blood.  You have a fever.  You  keep throwing up (vomiting).  You feel weak or you pass out (faint).  You feel confused.  You are not able to sleep because of your symptoms.  You are not able to do daily activities. Summary  COPD exacerbations are times that breathing gets worse and you need more treatment than normal.  COPD exacerbations can be very serious and may cause your lungs to become more damaged.  Do not smoke. If you need help quitting, ask your doctor.  Stay up-to-date on your shots. Get a flu shot every year. This information is not intended to replace advice given to you by your health care provider. Make sure you discuss any questions you have with your health care provider. Document Released: 01/02/2011 Document Revised: 02/18/2016 Document Reviewed: 02/18/2016 Elsevier Interactive Patient Education  2019 [employer].

## 2018-04-08 NOTE — Assessment & Plan Note (Signed)
Assessment: August/2019 pulmonary function test shows DLCO 54 08/2017 CT chest shows diffuse bronchial wall thickening mild centrilobular and paraseptal emphysema, few scattered areas of bronchiectasis Patient was maintained well on Trelegy Ellipta >>> Patient has been off of Trelegy Ellipta for the last month due to its cost mMRC 4 today Expiratory wheeze on exam today Patient reports purulent sputum for last week  Plan: Offered breathing treatment office today patient declined and reports she will do in a soon as she gets home Prednisone taper today Z-Pak today Trelegy Ellipta sample provided to patient today Patient will need to contact our office back within the next week to see if we have additional samples Patient will have to contact her insurance to see why she is having the higher cost I believe this is due to a high deductible Follow-up with our office in 2 weeks, if you cannot afford to come to this office visit and please contact our office via telephone so I can be updated with how you are doing clinically

## 2018-04-08 NOTE — Assessment & Plan Note (Addendum)
Assessment: Current every day smoking 4 cigarettes a day Not interested in stopping smoking at this time due to high stress due to finances  Plan: We recommend that you stop smoking When you are willing to stop smoking please let us know so we can support you with this

## 2018-04-12 ENCOUNTER — Telehealth: Payer: Self-pay

## 2018-04-12 MED ORDER — FLUTICASONE-UMECLIDIN-VILANT 100-62.5-25 MCG/INH IN AEPB: 1.0000 | INHALATION_SPRAY | Freq: Every day | RESPIRATORY_TRACT | 0 refills | Status: DC

## 2018-04-12 NOTE — Telephone Encounter (Signed)
Called office requesting samples of trelegy   Call back # [phone]

## 2018-04-12 NOTE — Telephone Encounter (Signed)
Called and spoke with patient she stated that she needs just one sample of Trelegy until she can get her check to buy some for next month. One sample placed up front. Nothing further needed.

## 2018-04-15 DIAGNOSIS — J449 Chronic obstructive pulmonary disease, unspecified: Secondary | ICD-10-CM | POA: Diagnosis not present

## 2018-04-22 ENCOUNTER — Ambulatory Visit: Payer: Medicare HMO | Admitting: Pulmonary Disease

## 2018-04-27 ENCOUNTER — Telehealth: Payer: Self-pay | Admitting: Pulmonary Disease

## 2018-04-27 NOTE — Telephone Encounter (Signed)
Okay noted.   [name] FNP

## 2018-04-27 NOTE — Telephone Encounter (Signed)
FYI:  Pt called her ins plan;  Her yearly deductible is $250. She has only paid $30 towards deductible.  Trelegy is a covered drug but in a higher tier.  Pt will pay $295 towards her deductible because Trelegy has really helped her.  Televisit scheduled for Thur afternoon.  Nothing further needed.

## 2018-04-28 NOTE — Progress Notes (Signed)
Virtual Visit via Telephone Note  I connected with Rachel Wang on 04/29/18 at  3:00 PM EDT by telephone and verified that I am speaking with the correct person using two identifiers.   I discussed the limitations, risks, security and privacy concerns of performing an evaluation and management service by telephone and the availability of in person appointments. I also discussed with the patient that there may be a patient responsible charge related to this service. The patient expressed understanding and agreed to proceed.   History of Present Illness: 68 year old female current every day smoker followed in our office for emphysema  PMH: GERD, hyperlipidemia Smoker/ Smoking History: Current every day smoker. 4 cigarettes daily. 25-pack-year smoking history. Maintenance: Trelegy Ellipta   Pt of: Dr. Chase Caller  Patient consented to consult via telephone: Pt People present and their role in pt care: yes  Chief complaint: Emphysema   68 year old current smoker (6 cigarettes daily) patient reports that she is increased her cigarette smoking due to the COVID-19 stress, quarantine, as well as family members in [place] who are currently COVID-19 positive.  This is how she is managing her anxiety.  Her daughter is staying with her at this time.  Her daughter is actively working to try to help reduce smoking.  Daughter helps encourage patient to space out cigarettes and to go on walks during the day.  Since last office visit patient has been maintained on Trelegy Ellipta samples.  She followed up with her pharmacy who informed her that she has a $250 yearly deductible.  She plans on going next week to get the Trelegy Ellipta from the pharmacy which will cost $250.  At that point in time it should be around $45 as it is a tier 3 medication with her insurance.  Patient reports that her breathing has been much better since last office visit.  mMRC 2 today.  2 weeks ago she is on mMRC 4.  She  denies wheezing or increased cough or congestion.  MMRC - Breathlessness Score 2 - on level ground, I walk slower than people of the same age because of breathlessness, or have to stop for breathe when walking to my own pace  Observations/Objective:  09/18/2017-CT chest without contrast-diffuse bronchial wall thickening and mild centrilobular and paraseptal emphysema, few scattered areas of bronchiectasis, multiple tiny pulmonary nodules in lungs bilaterally measuring 5 mm or less, stable from prior study  08/31/2017-pulmonary function test- FVC 1.51 (58% predicted), postbronchodilator ratio 85, FEV1 65, no bronchodilator response, DLCO 54  No results found for: NITRICOXIDE  Assessment and Plan:  Centrilobular emphysema ([place]) Assessment: August/2019 pulmonary function test shows DLCO 54 08/2017 CT chest shows diffuse bronchial wall thickening mild centrilobular and paraseptal emphysema, few scattered areas of bronchiectasis Patient was maintained well on Trelegy Ellipta mMRC 2 today Still smoking   Plan: Continue Trelegy Ellipta sample provided to patient today Rescue inhaler as needed Purchase Trelegy from the Pharmacy  You need to stop smoking      Tobacco abuse disorder Assessment:  6 cigarettes smoked today Current everyday smoker  Plan: Continue to reduce cigarette consumption We recommend that you stop smoking Try to space out cigarettes with walks throughout the day   Follow Up Instructions:  Return in about 3 months (around 07/29/2018), or if symptoms worsen or fail to improve, for Follow up with Dr. Purnell Shoemaker, Follow up with [name].    I discussed the assessment and treatment plan with the patient. The patient  was provided an opportunity to ask questions and all were answered. The patient agreed with the plan and demonstrated an understanding of the instructions.   The patient was advised to call back or seek an in-person evaluation if the symptoms  worsen or if the condition fails to improve as anticipated.  I provided 22 minutes of non-face-to-face time during this encounter.   Lauraine Rinne, NP

## 2018-04-29 ENCOUNTER — Encounter: Payer: Self-pay | Admitting: Pulmonary Disease

## 2018-04-29 ENCOUNTER — Ambulatory Visit (INDEPENDENT_AMBULATORY_CARE_PROVIDER_SITE_OTHER): Payer: Medicare HMO | Admitting: Pulmonary Disease

## 2018-04-29 ENCOUNTER — Other Ambulatory Visit: Payer: Self-pay

## 2018-04-29 DIAGNOSIS — R69 Illness, unspecified: Secondary | ICD-10-CM | POA: Diagnosis not present

## 2018-04-29 DIAGNOSIS — J432 Centrilobular emphysema: Secondary | ICD-10-CM

## 2018-04-29 DIAGNOSIS — F17210 Nicotine dependence, cigarettes, uncomplicated: Secondary | ICD-10-CM

## 2018-04-29 DIAGNOSIS — Z720 Tobacco use: Secondary | ICD-10-CM

## 2018-04-29 NOTE — Assessment & Plan Note (Signed)
Assessment: August/2019 pulmonary function test shows DLCO 54 08/2017 CT chest shows diffuse bronchial wall thickening mild centrilobular and paraseptal emphysema, few scattered areas of bronchiectasis Patient was maintained well on Trelegy Ellipta mMRC 2 today Still smoking   Plan: Continue Trelegy Ellipta sample provided to patient today Rescue inhaler as needed Purchase Trelegy from the Pharmacy  You need to stop smoking

## 2018-04-29 NOTE — Patient Instructions (Addendum)
Continue Trelegy Ellipta  >>> 1 puff daily in the morning >>>rinse mouth out after use  >>> This inhaler contains 3 medications that help manage her respiratory status, contact our office if you cannot afford this medication or unable to remain on this medication  Please go to the pharmacy and purchase this medication so you do not run out, I know that this is a high cost in order to meet the deductible, I hope that after meeting the deductible it should be around a $45 cost  Only use your albuterol as a rescue medication to be used if you can't catch your breath by resting or doing a relaxed purse lip breathing pattern.  - The less you use it, the better it will work when you need it. - Ok to use up to 2 puffs every 4 hours if you must but call for immediate appointment if use goes up over your usual need - Don't leave home without it !! (think of it like the spare tire for your car)    Note your daily symptoms > remember "red flags" for COPD:   >>>Increase in cough >>>increase in sputum production >>>increase in shortness of breath or activity  intolerance.   If you notice these symptoms, please call the office to be seen.    Return in about 3 months (around 07/29/2018), or if symptoms worsen or fail to improve, for Follow up with Dr. Purnell Shoemaker, Follow up with [name].   Coronavirus (COVID-19) Are you at risk?  Are you at risk for the Coronavirus (COVID-19)?  To be considered HIGH RISK for Coronavirus (COVID-19), you have to meet the following criteria:  . Traveled to [place], [place], [place], [place] or [place]; or in the [place] to [place], [place], [place], or [place]; and have fever, cough, and shortness of breath within the last 2 weeks of travel OR . Been in close contact with a person diagnosed with COVID-19 within the last 2 weeks and have fever, cough, and shortness of breath . IF YOU DO NOT MEET THESE CRITERIA, YOU ARE CONSIDERED LOW RISK FOR  COVID-19.  What to do if you are HIGH RISK for COVID-19?  Marland Kitchen If you are having a medical emergency, call 911. . Seek medical care right away. Before you go to a doctor's office, urgent care or emergency department, call ahead and tell them about your recent travel, contact with someone diagnosed with COVID-19, and your symptoms. You should receive instructions from your physician's office regarding next steps of care.  . When you arrive at healthcare provider, tell the healthcare staff immediately you have returned from visiting [place], [place], [place], [place] or [place]; or traveled in the [place] to [place], [place], [place], or [place]; in the last two weeks or you have been in close contact with a person diagnosed with COVID-19 in the last 2 weeks.   . Tell the health care staff about your symptoms: fever, cough and shortness of breath. . After you have been seen by a medical provider, you will be either: o Tested for (COVID-19) and discharged home on quarantine except to seek medical care if symptoms worsen, and asked to  - Stay home and avoid contact with others until you get your results (4-5 days)  - Avoid travel on public transportation if possible (such as bus, train, or airplane) or o Sent to the Emergency Department by EMS for evaluation, COVID-19 testing, and possible admission depending on  your condition and test results.  What to do if you are LOW RISK for COVID-19?  Reduce your risk of any infection by using the same precautions used for avoiding the common cold or flu:  Marland Kitchen Wash your hands often with soap and warm water for at least 20 seconds.  If soap and water are not readily available, use an alcohol-based hand sanitizer with at least 60% alcohol.  . If coughing or sneezing, cover your mouth and nose by coughing or sneezing into the elbow areas of your shirt or coat, into a tissue or into your sleeve (not your hands). . Avoid shaking hands with others and consider  head nods or verbal greetings only. . Avoid touching your eyes, nose, or mouth with unwashed hands.  . Avoid close contact with people who are sick. . Avoid places or events with large numbers of people in one location, like concerts or sporting events. . Carefully consider travel plans you have or are making. . If you are planning any travel outside or inside the [place], visit the CDC's Travelers' Health webpage for the latest health notices. . If you have some symptoms but not all symptoms, continue to monitor at home and seek medical attention if your symptoms worsen. . If you are having a medical emergency, call 911.   [place] / e-Visit: [email or web address]         [facility]: [facility]: [phone]                   [facility]: [phone]           It is flu season:   >>> Best ways to protect herself from the flu: Receive the yearly flu vaccine, practice good hand hygiene washing with soap and also using hand sanitizer when available, eat a nutritious meals, get adequate rest, hydrate appropriately   Please contact the office if your symptoms worsen or you have concerns that you are not improving.   Thank you for choosing [facility] for your healthcare, and for allowing [place] to partner with you on your healthcare journey. I am thankful to be able to provide care to you today.   [name]     Chronic Obstructive Pulmonary Disease Chronic obstructive pulmonary disease (COPD) is a long-term (chronic) lung problem. When you have COPD, it is hard for air to get in and out of your lungs. Usually the condition gets worse over time, and your lungs will never return to normal. There are things you can do to keep yourself as healthy as possible.  Your doctor may treat your condition  with: ? Medicines. ? Oxygen. ? Lung surgery.  Your doctor may also recommend: ? Rehabilitation. This includes steps to make your body work better. It may involve a team of specialists. ? Quitting smoking, if you smoke. ? Exercise and changes to your diet. ? Comfort measures (palliative care). Follow these instructions at home: Medicines  Take over-the-counter and prescription medicines only as told by your doctor.  Talk to your doctor before taking any cough or allergy medicines. You may need to avoid medicines that cause your lungs to be dry. Lifestyle  If you smoke, stop. Smoking makes the problem worse. If you need help quitting, ask your doctor.  Avoid being around things that make your breathing worse. This may include smoke, chemicals, and fumes.  Stay active, but remember  to rest as well.  Learn and use tips on how to relax.  Make sure you get enough sleep. Most adults need at least 7 hours of sleep every night.  Eat healthy foods. Eat smaller meals more often. Rest before meals. Controlled breathing Learn and use tips on how to control your breathing as told by your doctor. Try:  Breathing in (inhaling) through your nose for 1 second. Then, pucker your lips and breath out (exhale) through your lips for 2 seconds.  Putting one hand on your belly (abdomen). Breathe in slowly through your nose for 1 second. Your hand on your belly should move out. Pucker your lips and breathe out slowly through your lips. Your hand on your belly should move in as you breathe out.  Controlled coughing Learn and use controlled coughing to clear mucus from your lungs. Follow these steps: 1. Lean your head a little forward. 2. Breathe in deeply. 3. Try to hold your breath for 3 seconds. 4. Keep your mouth slightly open while coughing 2 times. 5. Spit any mucus out into a tissue. 6. Rest and do the steps again 1 or 2 times as needed. General instructions  Make sure you get all the shots  (vaccines) that your doctor recommends. Ask your doctor about a flu shot and a pneumonia shot.  Use oxygen therapy and pulmonary rehabilitation if told by your doctor. If you need home oxygen therapy, ask your doctor if you should buy a tool to measure your oxygen level (oximeter).  Make a COPD action plan with your doctor. This helps you to know what to do if you feel worse than usual.  Manage any other conditions you have as told by your doctor.  Avoid going outside when it is very hot, cold, or humid.  Avoid people who have a sickness you can catch (contagious).  Keep all follow-up visits as told by your doctor. This is important. Contact a doctor if:  You cough up more mucus than usual.  There is a change in the color or thickness of the mucus.  It is harder to breathe than usual.  Your breathing is faster than usual.  You have trouble sleeping.  You need to use your medicines more often than usual.  You have trouble doing your normal activities such as getting dressed or walking around the house. Get help right away if:  You have shortness of breath while resting.  You have shortness of breath that stops you from: ? Being able to talk. ? Doing normal activities.  Your chest hurts for longer than 5 minutes.  Your skin color is more blue than usual.  Your pulse oximeter shows that you have low oxygen for longer than 5 minutes.  You have a fever.  You feel too tired to breathe normally. Summary  Chronic obstructive pulmonary disease (COPD) is a long-term lung problem.  The way your lungs work will never return to normal. Usually the condition gets worse over time. There are things you can do to keep yourself as healthy as possible.  Take over-the-counter and prescription medicines only as told by your doctor.  If you smoke, stop. Smoking makes the problem worse. This information is not intended to replace advice given to you by your health care provider. Make  sure you discuss any questions you have with your health care provider. Document Released: 07/02/2007 Document Revised: 02/18/2016 Document Reviewed: 02/18/2016 Elsevier Interactive Patient Education  2019 [place] Risks of  Smoking Smoking cigarettes is very bad for your health. Tobacco smoke has over 200 known poisons in it. It contains the poisonous gases nitrogen oxide and carbon monoxide. There are over 60 chemicals in tobacco smoke that cause cancer. Smoking is difficult to quit because a chemical in tobacco, called nicotine, causes addiction or dependence. When you smoke and inhale, nicotine is absorbed rapidly into the bloodstream through your lungs. Both inhaled and non-inhaled nicotine may be addictive. What are the risks of cigarette smoke? Cigarette smokers have an increased risk of many serious medical problems, including:  Lung cancer.  Lung disease, such as pneumonia, bronchitis, and emphysema.  Chest pain (angina) and heart attack because the heart is not getting enough oxygen.  Heart disease and peripheral blood vessel disease.  High blood pressure (hypertension).  Stroke.  Oral cancer, including cancer of the lip, mouth, or voice box.  Bladder cancer.  Pancreatic cancer.  Cervical cancer.  Pregnancy complications, including premature birth.  Stillbirths and smaller newborn babies, birth defects, and genetic damage to sperm.  Early menopause.  Lower estrogen level for women.  Infertility.  Facial wrinkles.  Blindness.  Increased risk of broken bones (fractures).  Senile dementia.  Stomach ulcers and internal bleeding.  Delayed wound healing and increased risk of complications during surgery.  Even smoking lightly shortens your life expectancy by several years. Because of secondhand smoke exposure, children of smokers have an increased risk of the following:  Sudden infant death syndrome (SIDS).  Respiratory infections.  Lung  cancer.  Heart disease.  Ear infections. What are the benefits of quitting? There are many health benefits of quitting smoking. Here are some of them:  Within days of quitting smoking, your risk of having a heart attack decreases, your blood flow improves, and your lung capacity improves. Blood pressure, pulse rate, and breathing patterns start returning to normal soon after quitting.  Within months, your lungs may clear up completely.  Quitting for 10 years reduces your risk of developing lung cancer and heart disease to almost that of a nonsmoker.  People who quit may see an improvement in their overall quality of life. How do I quit smoking?     Smoking is an addiction with both physical and psychological effects, and longtime habits can be hard to change. Your health care provider can recommend:  Programs and community resources, which may include group support, education, or talk therapy.  Prescription medicines to help reduce cravings.  Nicotine replacement products, such as patches, gum, and nasal sprays. Use these products only as directed. Do not replace cigarette smoking with electronic cigarettes, which are commonly called e-cigarettes. The safety of e-cigarettes is not known, and some may contain harmful chemicals.  A combination of two or more of these methods. Where to find more information  American Lung Association: www.lung.org  American Cancer Society: www.cancer.org Summary  Smoking cigarettes is very bad for your health. Cigarette smokers have an increased risk of many serious medical problems, including several cancers, heart disease, and stroke.  Smoking is an addiction with both physical and psychological effects, and longtime habits can be hard to change.  By stopping right away, you can greatly reduce the risk of medical problems for you and your family.  To help you quit smoking, your health care provider can recommend programs, community resources,  prescription medicines, and nicotine replacement products such as patches, gum, and nasal sprays. This information is not intended to replace advice given to you by your health care provider.  Make sure you discuss any questions you have with your health care provider. Document Released: 02/21/2004 Document Revised: 04/16/2017 Document Reviewed: 01/18/2016 Elsevier Interactive Patient Education  2019 [employer].

## 2018-04-29 NOTE — Assessment & Plan Note (Addendum)
Assessment:  6 cigarettes smoked today Current everyday smoker  Plan: Continue to reduce cigarette consumption We recommend that you stop smoking Try to space out cigarettes with walks throughout the day

## 2018-05-16 DIAGNOSIS — J449 Chronic obstructive pulmonary disease, unspecified: Secondary | ICD-10-CM | POA: Diagnosis not present

## 2018-05-27 ENCOUNTER — Other Ambulatory Visit: Payer: Self-pay | Admitting: Cardiology

## 2018-05-27 DIAGNOSIS — I714 Abdominal aortic aneurysm, without rupture, unspecified: Secondary | ICD-10-CM

## 2018-06-15 DIAGNOSIS — J449 Chronic obstructive pulmonary disease, unspecified: Secondary | ICD-10-CM | POA: Diagnosis not present

## 2018-07-16 DIAGNOSIS — J449 Chronic obstructive pulmonary disease, unspecified: Secondary | ICD-10-CM | POA: Diagnosis not present

## 2018-07-19 ENCOUNTER — Encounter: Payer: Self-pay | Admitting: Cardiology

## 2018-07-19 ENCOUNTER — Other Ambulatory Visit: Payer: Self-pay

## 2018-07-19 ENCOUNTER — Ambulatory Visit (INDEPENDENT_AMBULATORY_CARE_PROVIDER_SITE_OTHER): Payer: Medicare HMO | Admitting: Cardiology

## 2018-07-19 VITALS — Ht 65.0 in | Wt 180.0 lb

## 2018-07-19 DIAGNOSIS — I714 Abdominal aortic aneurysm, without rupture, unspecified: Secondary | ICD-10-CM

## 2018-07-19 DIAGNOSIS — E7800 Pure hypercholesterolemia, unspecified: Secondary | ICD-10-CM | POA: Diagnosis not present

## 2018-07-19 DIAGNOSIS — Z720 Tobacco use: Secondary | ICD-10-CM | POA: Diagnosis not present

## 2018-07-19 DIAGNOSIS — I2510 Atherosclerotic heart disease of native coronary artery without angina pectoris: Secondary | ICD-10-CM

## 2018-07-19 DIAGNOSIS — J432 Centrilobular emphysema: Secondary | ICD-10-CM

## 2018-07-19 NOTE — Progress Notes (Signed)
Virtual Visit via Telephone Note: Patient unable to use video assisted device.  This visit type was conducted due to national recommendations for restrictions regarding the COVID-19 Pandemic (e.g. social distancing).  This format is felt to be most appropriate for this patient at this time.  All issues noted in this document were discussed and addressed.  No physical exam was performed.  The patient has consented to conduct a Telehealth visit and understands insurance will be billed.   I connected with@, on 07/19/18 at  by TELEPHONE and verified that I am speaking with the correct person using two identifiers.   I discussed the limitations of evaluation and management by telemedicine and the availability of in person appointments. The patient expressed understanding and agreed to proceed.   I have discussed with patient regarding the safety during COVID Pandemic and steps and precautions to be taken including social distancing, frequent hand wash and use of detergent soap, gels with the patient. I asked the patient to avoid touching mouth, nose, eyes, ears with the hands. I encouraged regular walking around the neighborhood and exercise and regular diet, as long as social distancing can be maintained.  Primary Physician/Referring:  Lance Sell, NP  Patient ID: Rachel Wang, female    DOB: 09/28/1950, 68 y.o.   MRN: [id number]  Chief Complaint  Patient presents with  . Chest Pain  . Hyperlipidemia  . Follow-up    HPI: Rachel Wang  is a 68 y.o. female who I had last seen 6 months ago and  referred to me by Dr. Eben Burow for evaluation of coronary calcification noted on the recent CT scan performed on 09/18/2017 for workup of dyspnea and COPD. 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 She has not had any further chest pain and due to regular walking, dyspnea has also improved.   She also complains of  pain in both her legs with  activity, states it is mild. It has not stopped her from doing things.   She still continues to smoke. Unfortunately and states that it has been extremely difficult to quit. Now smoking 2-3 cigarettes a day and is trying her best to stop.  Past Medical History:  Diagnosis Date  . Bronchitis   . COPD (chronic obstructive pulmonary disease) ([place])   . Emphysema of lung ([place])   . Hyperlipidemia   . Stroke [facility])    History reviewed. No pertinent surgical history.  Social History   Socioeconomic History  . Marital status: Widowed    Spouse name: Not on file  . Number of children: 3  . Years of education: 26  . Highest education level: Not on file  Occupational History  . Occupation: Retired  Scientific laboratory technician  . Financial resource strain: Not on file  . Food insecurity    Worry: Not on file    Inability: Not on file  . Transportation needs    Medical: Not on file    Non-medical: Not on file  Tobacco Use  . Smoking status: Current Some Day Smoker    Packs/day: 0.50    Years: 51.00    Pack years: 25.50    Types: Cigarettes    Start date: 04/08/1967  . Smokeless tobacco: Never Used  . Tobacco comment: 10 cigs a day now-2 cigs in 3 weeks time (01/07/18)3-4 per day 04/08/18  Substance and Sexual Activity  . Alcohol use: No  . Drug use: No  . Sexual activity:  Not on file  Lifestyle  . Physical activity    Days per week: Not on file    Minutes per session: Not on file  . Stress: Not on file  Relationships  . Social Herbalist on phone: Not on file    Gets together: Not on file    Attends religious service: Not on file    Active member of club or organization: Not on file    Attends meetings of clubs or organizations: Not on file    Relationship status: Not on file  . Intimate partner violence    Fear of current or ex partner: Not on file    Emotionally abused: Not on file    Physically abused: Not on file    Forced sexual activity: Not on file  Other Topics  Concern  . Not on file  Social History Narrative   Fun: Dancing, cooking   Denies abuse and feels safe at home.    CNA for 30 years   Review of Systems  Constitution: Negative for chills, decreased appetite, malaise/fatigue and weight gain.  Cardiovascular: Positive for claudication (leg cramps at night). Negative for dyspnea on exertion, leg swelling and syncope.  Respiratory: Positive for cough (chronic) and shortness of breath.   Endocrine: Negative for cold intolerance.  Hematologic/Lymphatic: Does not bruise/bleed easily.  Musculoskeletal: Positive for joint pain (bilateral hips and knee) and muscle cramps. Negative for joint swelling.  Gastrointestinal: Negative for abdominal pain, anorexia, change in bowel habit, hematochezia and melena.  Neurological: Negative for headaches and light-headedness.  Psychiatric/Behavioral: Negative for depression and substance abuse.  All other systems reviewed and are negative.     Objective  Height 5' 5"  (1.651 m), weight 180 lb (81.6 kg). Body mass index is 29.95 kg/m. Physical exam not performed or limited due to virtual visit.    Please see exam details from prior visit is as below.    Physical Exam  Constitutional: She appears well-developed and well-nourished. No distress.  HENT:  Head: Atraumatic.  Eyes: Conjunctivae are normal.  Neck: Neck supple. No JVD present. No thyromegaly present.  Cardiovascular: Normal rate, regular rhythm and normal heart sounds. Exam reveals no gallop.  No murmur heard. Chronically ischemic skin changes both lower extremities. Feeble femoral pulse and pedal pulse bilateral. No carotid bruit.  No edema, no JVD  Pulmonary/Chest: Effort normal. She has rales (bilateral scattered).  Barrel shaped  Abdominal: Soft. Bowel sounds are normal.  Musculoskeletal: Normal range of motion.  Neurological: She is alert.  Skin: Skin is warm and dry.  Psychiatric: She has a normal mood and affect.   Radiology: No  results found.  Laboratory examination:   01/04/2018: Cholesterol 132, triglycerides 73, HDL 47, LDL 70.  Creatinine 0.74, EGFR 84/97, potassium 3.9, CMP normal.  CMP Latest Ref Rng & Units 07/19/2017 07/18/2017 04/03/2017  Glucose 65 - 99 mg/dL 158(H) 144(H) 89  BUN 6 - 20 mg/dL 6 <3(L) 8  Creatinine 0.44 - 1.00 mg/dL 0.76 0.70 0.80  Sodium 135 - 145 mmol/L 141 143 139  Potassium 3.5 - 5.1 mmol/L 3.4(L) 3.4(L) 3.4(L)  Chloride 101 - 111 mmol/L 106 102 100  CO2 22 - 32 mmol/L 25 - 33(H)  Calcium 8.9 - 10.3 mg/dL 9.4 - 9.8  Total Protein 6.0 - 8.3 g/dL - - 7.6  Total Bilirubin 0.2 - 1.2 mg/dL - - 0.2  Alkaline Phos 39 - 117 U/L - - 82  AST 0 - 37 U/L - -  12  ALT 0 - 35 U/L - - 12   CBC Latest Ref Rng & Units 07/19/2017 07/18/2017 07/18/2017  WBC 4.0 - 10.5 K/uL 9.2 - 7.5  Hemoglobin 12.0 - 15.0 g/dL 13.0 15.0 13.8  Hematocrit 36.0 - 46.0 % 40.8 44.0 44.2  Platelets 150 - 400 K/uL 280 - 293   Medications   Medications Discontinued During This Encounter  Medication Reason  . albuterol (PROVENTIL HFA;VENTOLIN HFA) 108 (90 Base) MCG/ACT inhaler Error  . azithromycin (ZITHROMAX) 250 MG tablet Error  . doxycycline (VIBRA-TABS) 100 MG tablet Error  . Fluticasone-Umeclidin-Vilant (TRELEGY ELLIPTA) 100-62.5-25 MCG/INH AEPB Error  . Fluticasone-Umeclidin-Vilant (TRELEGY ELLIPTA) 100-62.5-25 MCG/INH AEPB Error  . mometasone-formoterol (DULERA) 200-5 MCG/ACT AERO Error  . pantoprazole (PROTONIX) 40 MG tablet Error  . predniSONE (DELTASONE) 10 MG tablet Error  . Spacer/Aero-Holding Josiah Lobo DEVI Error   Current Meds  Medication Sig  . albuterol (PROVENTIL HFA;VENTOLIN HFA) 108 (90 Base) MCG/ACT inhaler Inhale 2 puffs into the lungs every 4 (four) hours as needed for wheezing or shortness of breath.  Marland Kitchen aspirin 81 MG chewable tablet Chew 81 mg by mouth daily.  . bisoprolol (ZEBETA) 5 MG tablet Take 2.5 mg by mouth daily.  . calcium carbonate (TUMS - DOSED IN MG ELEMENTAL CALCIUM) 500 MG  chewable tablet Chew 1-2 tablets by mouth as needed for indigestion or heartburn.  . Fluticasone-Umeclidin-Vilant (TRELEGY ELLIPTA) 100-62.5-25 MCG/INH AEPB Inhale 1 puff into the lungs daily.  Marland Kitchen guaifenesin (ROBITUSSIN) 100 MG/5ML syrup Take 200 mg by mouth 3 (three) times daily as needed (to loosen phlegm).  . simvastatin (ZOCOR) 40 MG tablet Take 1 tablet (40 mg total) by mouth daily. Must keep scheduled appt for future refills  . Tiotropium Bromide Monohydrate (SPIRIVA RESPIMAT) 2.5 MCG/ACT AERS Inhale 2 puffs into the lungs daily.    Cardiac Studies:   Lower extremity arterial duplex 12/11/2017: No hemodynamically significant stenoses are identified in the  lower extremity arterial system. There is a heavily calcified plaque in the bilateral CFA right more than left with <50% stenosis.  This exam reveals normal perfusion of the lower extremity (ABI 1.00 bilateral).  Normal triphasic waveforms of the right ankle. Moderately abnormal waveforms of the left ankle. Clinical correlation recommended.  Echocardiogram 12/11/2017: Left ventricle cavity is normal in size. Normal left ventricular shape. Normal global wall motion. Normal diastolic filling pattern. Calculated EF 56%. No hemodynamically significant valvular abnormality.  Normal right atrial pressure.  CT scan of the chest 09/18/2017, comparison 11/03/2015.  Aortic atherosclerosis, atherosclerosis of the great vessels of the mediastinum and three-vessel coronary artery calcification including left main. Bronchiectasis.  Centrilobular and paraseptal emphysema.  Multiple tiny pulmonary nodules.  Exercise myoview stress 01/04/2018:  1. The patient performed treadmill exercise using Bruce protocol, completing 5:33 minutes. The patient completed an estimated workload of 7 METS, reaching 86% of the maximum predicted heart rate. Normal hemodynamic response was seen. Stress symptoms included chest tightness. Exercise capacity was low. No ischemic  changes seen on stress electrocardiogram.  2. The overall quality of the study is excellent. There is no evidence of abnormal lung activity. Stress and rest SPECT images demonstrate homogeneous tracer distribution throughout the myocardium. Gated SPECT imaging reveals normal myocardial thickening and wall motion. The left ventricular ejection fraction was normal (60%).   3. Low risk study.  Abdominal aortic duplex 12/14/2017: There is severe calcific plaque throughout the abdominal aorta with moderate dilatation in the mid aorta, aortic aneurysm measuring 3.13 x 3.12 x 3.18 cm is  seen (see image).  Recheck in 1 year. Normal iliac artery velocity. Iliac vessels are not well visualized.   Assessment   1. Coronary artery calcification seen on CT scan   2. AAA (abdominal aortic aneurysm) without rupture ([place])   3. Hypercholesteremia   4. Tobacco abuse disorder   5. Centrilobular emphysema ([place])    EKG 11/19/2017: Normal sinus rhythm/sinus tachycardia cardiac rate of 100 bpm, normal axis.  No evidence of ischemia, normal EKG.  Recommendations:   Mrs. Rain Wilhide is an African-American female referred to me by Dr. Eben Burow for evaluation of coronary calcification noted on theCT scan performed on 09/18/2017.  She has had a negative nuclear stress in December 2019.  Continue lipid-lowering therapy, lipids are now well controlled on simvastatin which she is tolerating well.  I encouraged her to visit her PCP who she has not seen him in greater than 2 years.  She is now reduced her smoking to 2 to 3 cigarettes a day and complete abstinence was discussed.  She needs surveillance of her AAA and we will schedule her for the duplex that was postponed due to COVID-19.  Dyspnea is related to underlying COPD and do not think that she has significant CAD.  For the past 1 year she has not had any further recurrence of exertional chest pain suggestive of angina pectoris.  Angina remains resolved with  medical therapy.  Blood pressure is well controlled by recent visits to her doctor.  I will see her back in a year.   Adrian Prows, MD, [facility] 07/19/2018, 10:25 AM [facility] [place] Pager: [phone] Office: [phone] If no answer Cell [phone]

## 2018-07-23 ENCOUNTER — Ambulatory Visit: Payer: Medicare HMO | Admitting: Cardiology

## 2018-08-02 ENCOUNTER — Ambulatory Visit: Payer: Medicare HMO | Admitting: Pulmonary Disease

## 2018-08-05 ENCOUNTER — Other Ambulatory Visit: Payer: Self-pay

## 2018-08-05 ENCOUNTER — Telehealth: Payer: Self-pay | Admitting: Pulmonary Disease

## 2018-08-05 MED ORDER — TRELEGY ELLIPTA 100-62.5-25 MCG/INH IN AEPB: 1.0000 | INHALATION_SPRAY | Freq: Every day | RESPIRATORY_TRACT | 5 refills | Status: DC

## 2018-08-05 NOTE — Telephone Encounter (Signed)
Spoke with pt. She is needing a refill on Trelegy. Rx has been sent in. Nothing further was needed at this time.

## 2018-08-06 ENCOUNTER — Other Ambulatory Visit: Payer: Self-pay | Admitting: *Deleted

## 2018-08-06 MED ORDER — TRELEGY ELLIPTA 100-62.5-25 MCG/INH IN AEPB: 1.0000 | INHALATION_SPRAY | Freq: Every day | RESPIRATORY_TRACT | 5 refills | Status: DC

## 2018-08-12 ENCOUNTER — Ambulatory Visit: Payer: Medicare HMO | Admitting: Pulmonary Disease

## 2018-08-12 NOTE — Progress Notes (Signed)
@Patient  ID: Rachel Wang, female    DOB: [birth year], 68 y.o.   MRN: [id number]  Chief Complaint  Patient presents with  . Follow-up    COPD follow up     Referring provider: Lance Sell, NP  HPI:  68 year old female current every day smoker followed in our office for emphysema  PMH: GERD, hyperlipidemia Smoker/ Smoking History: Current every day smoker. 4 cigarettes daily. 25-pack-year smoking history. Maintenance: Trelegy Ellipta   Pt of: Dr. Chase Caller  08/13/2018  - Visit   68 year old female current every day smoker followed in our office for COPD.  She is completing a 43-month follow-up with our office today.  Patient reports that her breathing has been stable and she has been doing well.  She is maintained on Trelegy Ellipta.  She reports that she loves her Trelegy Ellipta.  She had 5 days where she ran out of her Trelegy Ellipta due to her pharmacy.  Patient reports that overall she feels that she is doing well she is currently very saddened due to the COVID-19 pandemic.  She reports that it is really starting to weigh on her.  She feels that it is very sad that she cannot go anywhere.  She is scared because she does not want to contract COVID-19.  She reports that her sister who is located in [place] was COVID-19 positive and is currently in quarantine.  She has not had any exposure to her.  Patient does not have a lot of local family to interact with.  Most of her family interaction and friends interactions is via the telephone.  Patient continues to smoke today.  She is not interested in stopping smoking at this time.  She will discuss stopping smoking with her daughter.  MMRC - Breathlessness Score 2 - on level ground, I walk slower than people of the same age because of breathlessness, or have to stop for breathe when walking to my own pace     Tests:   09/18/2017-CT chest without contrast-diffuse bronchial wall thickening and mild  centrilobular and paraseptal emphysema, few scattered areas of bronchiectasis, multiple tiny pulmonary nodules in lungs bilaterally measuring 5 mm or less, stable from prior study  08/31/2017-pulmonary function test- FVC 1.51 (58% predicted), postbronchodilator ratio 85, FEV1 65, no bronchodilator response, DLCO 54  FENO:  No results found for: NITRICOXIDE  PFT: PFT Results Latest Ref Rng & Units 08/31/2017 12/28/2015  FVC-Pre L 1.51 1.66  FVC-Predicted Pre % 58 63  FVC-Post L 1.54 1.68  FVC-Predicted Post % 59 63  Pre FEV1/FVC % % 86 75  Post FEV1/FCV % % 85 78  FEV1-Pre L 1.29 1.24  FEV1-Predicted Pre % 64 60  FEV1-Post L 1.31 1.30  DLCO UNC% % 53 37  DLCO COR %Predicted % 95 78  TLC L - 3.52  TLC % Predicted % - 67  RV % Predicted % - 89    Imaging: No results found.    Specialty Problems      Pulmonary Problems   COPD exacerbation (HCC)   COPD, moderate ([place])   COPD with acute exacerbation (HCC)   Centrilobular emphysema ([place])    09/18/2017-CT chest without contrast-diffuse bronchial wall thickening and mild centrilobular and paraseptal emphysema, few scattered areas of bronchiectasis, multiple tiny pulmonary nodules in lungs bilaterally measuring 5 mm or less, stable from prior study  08/31/2017-pulmonary function test- FVC 1.51 (58% predicted), postbronchodilator ratio 85, FEV1 65, no bronchodilator response, DLCO 54  Allergies  Allergen Reactions  . Levofloxacin Nausea And Vomiting  . Tramadol Nausea And Vomiting  . Adhesive [Tape] Rash and Other (See Comments)    WELTS, also  . Latex Rash and Other (See Comments)    WELTS, also    Immunization History  Administered Date(s) Administered  . Pneumococcal Conjugate-13 04/03/2017   Needs Pneumovax 23 at next office visit  Past Medical History:  Diagnosis Date  . Bronchitis   . COPD (chronic obstructive pulmonary disease) ([place])   . Emphysema of lung ([place])   . Hyperlipidemia   . Stroke [facility])      Tobacco History: Social History   Tobacco Use  Smoking Status Current Some Day Smoker  . Packs/day: 0.50  . Years: 51.00  . Pack years: 25.50  . Types: Cigarettes  . Start date: 04/08/1967  Smokeless Tobacco Never Used   Ready to quit: No Counseling given: Yes  Smoking assessment and cessation counseling  Patient currently smoking: 4 cigarettes a day  I have advised the patient to quit/stop smoking as soon as possible due to high risk for multiple medical problems.  It will also be very difficult for [place] to manage patient's  respiratory symptoms and status if we continue to expose her lungs to a known irritant.  We do not advise e-cigarettes as a form of stopping smoking.  Patient is not willing to quit smoking.  I have advised the patient that we can assist and have options of nicotine replacement therapy, provided smoking cessation education today, provided smoking cessation counseling, and provided cessation resources.  Follow-up next office visit office visit for assessment of smoking cessation.  Smoking cessation counseling advised for: 6 min    Outpatient Encounter Medications as of 08/13/2018  Medication Sig  . albuterol (PROVENTIL HFA;VENTOLIN HFA) 108 (90 Base) MCG/ACT inhaler Inhale 2 puffs into the lungs every 4 (four) hours as needed for wheezing or shortness of breath.  Marland Kitchen albuterol (PROVENTIL) (2.5 MG/3ML) 0.083% nebulizer solution Take 3 mLs (2.5 mg total) by nebulization every 6 (six) hours as needed for wheezing or shortness of breath.  Marland Kitchen aspirin 81 MG chewable tablet Chew 81 mg by mouth daily.  . bisoprolol (ZEBETA) 5 MG tablet Take 2.5 mg by mouth daily.  . calcium carbonate (TUMS - DOSED IN MG ELEMENTAL CALCIUM) 500 MG chewable tablet Chew 1-2 tablets by mouth as needed for indigestion or heartburn.  . Fluticasone-Umeclidin-Vilant (TRELEGY ELLIPTA) 100-62.5-25 MCG/INH AEPB Inhale 1 puff into the lungs daily.  Marland Kitchen guaifenesin (ROBITUSSIN) 100 MG/5ML syrup Take 200  mg by mouth 3 (three) times daily as needed (to loosen phlegm).  . simvastatin (ZOCOR) 40 MG tablet Take 1 tablet (40 mg total) by mouth daily. Must keep scheduled appt for future refills  . Fluticasone-Umeclidin-Vilant (TRELEGY ELLIPTA) 100-62.5-25 MCG/INH AEPB Inhale 1 puff into the lungs daily.  . Tiotropium Bromide Monohydrate (SPIRIVA RESPIMAT) 2.5 MCG/ACT AERS Inhale 2 puffs into the lungs daily. (Patient not taking: Reported on 08/13/2018)   No facility-administered encounter medications on file as of 08/13/2018.      Review of Systems  Review of Systems  Constitutional: Negative for activity change, fatigue and fever.  HENT: Negative for sinus pressure, sinus pain and sore throat.   Respiratory: Positive for shortness of breath. Negative for cough and wheezing.   Cardiovascular: Negative for chest pain and palpitations.  Gastrointestinal: Negative for diarrhea, nausea and vomiting.  Musculoskeletal: Negative for arthralgias.  Neurological: Negative for dizziness.  Psychiatric/Behavioral: Positive for dysphoric mood. Negative  for sleep disturbance. The patient is not nervous/anxious.      Physical Exam  BP 118/70 (BP Location: Left Arm, Cuff Size: Normal)   Pulse 95   Temp 98.4 F (36.9 C) (Oral)   Ht 5\' 5"  (1.651 m)   Wt 193 lb 9.6 oz (87.8 kg)   SpO2 96%   BMI 32.22 kg/m   Wt Readings from Last 5 Encounters:  08/13/18 193 lb 9.6 oz (87.8 kg)  07/19/18 180 lb (81.6 kg)  04/08/18 191 lb 6.4 oz (86.8 kg)  01/07/18 182 lb 9.6 oz (82.8 kg)  12/10/17 181 lb 12.8 oz (82.5 kg)     Physical Exam Vitals signs and nursing note reviewed.  Constitutional:      General: She is not in acute distress.    Appearance: Normal appearance.  HENT:     Head: Normocephalic and atraumatic.     Right Ear: Tympanic membrane, ear canal and external ear normal. There is no impacted cerumen.     Left Ear: Tympanic membrane, ear canal and external ear normal. There is no impacted cerumen.      Nose: Nose normal. No congestion.     Mouth/Throat:     Mouth: Mucous membranes are moist.     Pharynx: Oropharynx is clear.  Eyes:     Pupils: Pupils are equal, round, and reactive to light.  Neck:     Musculoskeletal: Normal range of motion.  Cardiovascular:     Rate and Rhythm: Normal rate and regular rhythm.     Pulses: Normal pulses.     Heart sounds: Normal heart sounds. No murmur.  Pulmonary:     Effort: Pulmonary effort is normal. No respiratory distress.     Breath sounds: No decreased air movement. Wheezing (Slight expiratory wheeze) present. No decreased breath sounds or rales.  Skin:    General: Skin is warm and dry.     Capillary Refill: Capillary refill takes less than 2 seconds.  Neurological:     General: No focal deficit present.     Mental Status: She is alert and oriented to person, place, and time. Mental status is at baseline.     Gait: Gait normal.  Psychiatric:        Mood and Affect: Mood normal.        Behavior: Behavior normal.        Thought Content: Thought content normal.        Judgment: Judgment normal.      Lab Results:  CBC    Component Value Date/Time   WBC 9.2 07/19/2017 0505   RBC 4.50 07/19/2017 0505   HGB 13.0 07/19/2017 0505   HCT 40.8 07/19/2017 0505   PLT 280 07/19/2017 0505   MCV 90.7 07/19/2017 0505   MCH 28.9 07/19/2017 0505   MCHC 31.9 07/19/2017 0505   RDW 14.6 07/19/2017 0505   LYMPHSABS 1.7 07/18/2017 1420   MONOABS 0.4 07/18/2017 1420   EOSABS 0.1 07/18/2017 1420   BASOSABS 0.0 07/18/2017 1420    BMET    Component Value Date/Time   NA 141 07/19/2017 0505   K 3.4 (L) 07/19/2017 0505   CL 106 07/19/2017 0505   CO2 25 07/19/2017 0505   GLUCOSE 158 (H) 07/19/2017 0505   BUN 6 07/19/2017 0505   CREATININE 0.76 07/19/2017 0505   CALCIUM 9.4 07/19/2017 0505   GFRNONAA >60 07/19/2017 0505   GFRAA >60 07/19/2017 0505    BNP No results found for: BNP  ProBNP No results  found for: PROBNP    Assessment  & Plan:   Centrilobular emphysema ([place]) Assessment: August/2019 pulmonary function test shows DLCO 54 08/2017 CT chest shows diffuse bronchial wall thickening centrilobular and paraseptal emphysema, few scattered areas of bronchiectasis Maintained well on Trelegy Ellipta Still smoking  Plan: Continue Trelegy Ellipta Rescue inhaler as needed Emphasized again the need to stop smoking  Tobacco abuse disorder Assessment: Current every day smoker 4 cigarette smoke today  Plan:  Emphasized again need to stop smoking Could consider Wellbutrin in the future to help with curbing smoking cravings  Encounter for preventive care Plan: Patient needs Pneumovax 23 at next office visit    Return in about 3 months (around 11/13/2018), or if symptoms worsen or fail to improve, for Follow up with [name].   Lauraine Rinne, NP 08/13/2018   This appointment was 26 minutes long with over 50% of the time in direct face-to-face patient care, assessment, plan of care, and follow-up.

## 2018-08-13 ENCOUNTER — Ambulatory Visit: Payer: Medicare HMO | Admitting: Pulmonary Disease

## 2018-08-13 ENCOUNTER — Other Ambulatory Visit: Payer: Self-pay

## 2018-08-13 ENCOUNTER — Encounter: Payer: Self-pay | Admitting: Pulmonary Disease

## 2018-08-13 DIAGNOSIS — Z0000 Encounter for general adult medical examination without abnormal findings: Secondary | ICD-10-CM

## 2018-08-13 DIAGNOSIS — J432 Centrilobular emphysema: Secondary | ICD-10-CM

## 2018-08-13 DIAGNOSIS — F17210 Nicotine dependence, cigarettes, uncomplicated: Secondary | ICD-10-CM

## 2018-08-13 DIAGNOSIS — R69 Illness, unspecified: Secondary | ICD-10-CM | POA: Diagnosis not present

## 2018-08-13 DIAGNOSIS — Z720 Tobacco use: Secondary | ICD-10-CM

## 2018-08-13 MED ORDER — TRELEGY ELLIPTA 100-62.5-25 MCG/INH IN AEPB: 1.0000 | INHALATION_SPRAY | Freq: Every day | RESPIRATORY_TRACT | 0 refills | Status: DC

## 2018-08-13 NOTE — Assessment & Plan Note (Signed)
Assessment: Current every day smoker 4 cigarette smoke today  Plan:  Emphasized again need to stop smoking Could consider Wellbutrin in the future to help with curbing smoking cravings

## 2018-08-13 NOTE — Patient Instructions (Addendum)
ContinueTrelegy Ellipta  >>> 1 puff daily in the morning >>>rinse mouth out after use  >>> This inhaler contains 3 medications that help manage her respiratory status, contact our office if you cannot afford this medication or unable to remain on this medication   Only use your albuterol as a rescue medication to be used if you can't catch your breath by resting or doing a relaxed purse lip breathing pattern.  - The less you use it, the better it will work when you need it. - Ok to use up to 2 puffs every 4 hours if you must but call for immediate appointment if use goes up over your usual need - Don't leave home without it !! (think of it like the spare tire for your car)   Note your daily symptoms >remember "red flags" for COPD:  >>>Increase in cough >>>increase in sputum production >>>increase in shortness of breath or activity  intolerance.   If you notice these symptoms, please call the office to be seen.    We recommend that you stop smoking.  >>>You need to set a quit date >>>If you have friends or family who smoke, let them know you are trying to quit and not to smoke around you or in your living environment  Smoking Cessation Resources:  1 800 QUIT NOW  >>> Patient to call this resource and utilize it to help support her quit smoking >>> Keep up your hard work with stopping smoking  You can also contact the [facility] >>>For smoking cessation classes call [phone]  We do not recommend using e-cigarettes as a form of stopping smoking  You can sign up for smoking cessation support texts and information:  >>>https://smokefree.gov/smokefreetxt     Return in about 3 months (around 11/13/2018), or if symptoms worsen or fail to improve, for Follow up with [name].   Coronavirus (COVID-19) Are you at risk?  Are you at risk for the Coronavirus (COVID-19)?  To be considered HIGH RISK for Coronavirus (COVID-19), you have to meet the  following criteria:  . Traveled to [place], [place], [place], [place] or [place]; or in the [place] to [place], [place], [place], or [place]; and have fever, cough, and shortness of breath within the last 2 weeks of travel OR . Been in close contact with a person diagnosed with COVID-19 within the last 2 weeks and have fever, cough, and shortness of breath . IF YOU DO NOT MEET THESE CRITERIA, YOU ARE CONSIDERED LOW RISK FOR COVID-19.  What to do if you are HIGH RISK for COVID-19?  Marland Kitchen If you are having a medical emergency, call 911. . Seek medical care right away. Before you go to a doctor's office, urgent care or emergency department, call ahead and tell them about your recent travel, contact with someone diagnosed with COVID-19, and your symptoms. You should receive instructions from your physician's office regarding next steps of care.  . When you arrive at healthcare provider, tell the healthcare staff immediately you have returned from visiting [place], [place], [place], [place] or [place]; or traveled in the [place] to [place], [place], [place], or [place]; in the last two weeks or you have been in close contact with a person diagnosed with COVID-19 in the last 2 weeks.   . Tell the health care staff about your symptoms: fever, cough and shortness of breath. . After you have been seen by a medical provider, you will be  either: o Tested for (COVID-19) and discharged home on quarantine except to seek medical care if symptoms worsen, and asked to  - Stay home and avoid contact with others until you get your results (4-5 days)  - Avoid travel on public transportation if possible (such as bus, train, or airplane) or o Sent to the Emergency Department by EMS for evaluation, COVID-19 testing, and possible admission depending on your condition and test results.  What to do if you are LOW RISK for COVID-19?  Reduce your risk of any infection by using the same precautions used  for avoiding the common cold or flu:  Marland Kitchen Wash your hands often with soap and warm water for at least 20 seconds.  If soap and water are not readily available, use an alcohol-based hand sanitizer with at least 60% alcohol.  . If coughing or sneezing, cover your mouth and nose by coughing or sneezing into the elbow areas of your shirt or coat, into a tissue or into your sleeve (not your hands). . Avoid shaking hands with others and consider head nods or verbal greetings only. . Avoid touching your eyes, nose, or mouth with unwashed hands.  . Avoid close contact with people who are sick. . Avoid places or events with large numbers of people in one location, like concerts or sporting events. . Carefully consider travel plans you have or are making. . If you are planning any travel outside or inside the [place], visit the CDC's Travelers' Health webpage for the latest health notices. . If you have some symptoms but not all symptoms, continue to monitor at home and seek medical attention if your symptoms worsen. . If you are having a medical emergency, call 911.   [place] / e-Visit: [email or web address]         [facility]: [facility]: [phone]                   [facility]: [phone]           It is flu season:   >>> Best ways to protect herself from the flu: Receive the yearly flu vaccine, practice good hand hygiene washing with soap and also using hand sanitizer when available, eat a nutritious meals, get adequate rest, hydrate appropriately   Please contact the office if your symptoms worsen or you have concerns that you are not improving.   Thank you for choosing [facility] for your healthcare, and for allowing [place] to partner with you on your healthcare journey. I am thankful to be able to provide care to you today.    [name]

## 2018-08-13 NOTE — Assessment & Plan Note (Signed)
Plan: Patient needs Pneumovax 23 at next office visit

## 2018-08-13 NOTE — Assessment & Plan Note (Signed)
Assessment: August/2019 pulmonary function test shows DLCO 54 08/2017 CT chest shows diffuse bronchial wall thickening centrilobular and paraseptal emphysema, few scattered areas of bronchiectasis Maintained well on Trelegy Ellipta Still smoking  Plan: Continue Trelegy Ellipta Rescue inhaler as needed Emphasized again the need to stop smoking

## 2018-08-15 DIAGNOSIS — J449 Chronic obstructive pulmonary disease, unspecified: Secondary | ICD-10-CM | POA: Diagnosis not present

## 2018-09-15 DIAGNOSIS — J449 Chronic obstructive pulmonary disease, unspecified: Secondary | ICD-10-CM | POA: Diagnosis not present

## 2018-10-16 DIAGNOSIS — J449 Chronic obstructive pulmonary disease, unspecified: Secondary | ICD-10-CM | POA: Diagnosis not present

## 2018-10-19 DIAGNOSIS — H524 Presbyopia: Secondary | ICD-10-CM | POA: Diagnosis not present

## 2018-10-19 DIAGNOSIS — H269 Unspecified cataract: Secondary | ICD-10-CM | POA: Diagnosis not present

## 2018-10-19 DIAGNOSIS — H52209 Unspecified astigmatism, unspecified eye: Secondary | ICD-10-CM | POA: Diagnosis not present

## 2018-10-19 DIAGNOSIS — H5203 Hypermetropia, bilateral: Secondary | ICD-10-CM | POA: Diagnosis not present

## 2018-11-15 ENCOUNTER — Other Ambulatory Visit: Payer: Self-pay

## 2018-11-15 ENCOUNTER — Encounter: Payer: Self-pay | Admitting: Pulmonary Disease

## 2018-11-15 ENCOUNTER — Ambulatory Visit: Payer: Medicare HMO | Admitting: Pulmonary Disease

## 2018-11-15 VITALS — BP 118/72 | HR 87 | Temp 97.1°F | Ht 65.0 in | Wt 190.0 lb

## 2018-11-15 DIAGNOSIS — Z23 Encounter for immunization: Secondary | ICD-10-CM

## 2018-11-15 DIAGNOSIS — J449 Chronic obstructive pulmonary disease, unspecified: Secondary | ICD-10-CM | POA: Diagnosis not present

## 2018-11-15 DIAGNOSIS — F17210 Nicotine dependence, cigarettes, uncomplicated: Secondary | ICD-10-CM

## 2018-11-15 DIAGNOSIS — Z720 Tobacco use: Secondary | ICD-10-CM

## 2018-11-15 DIAGNOSIS — J432 Centrilobular emphysema: Secondary | ICD-10-CM | POA: Diagnosis not present

## 2018-11-15 DIAGNOSIS — Z0000 Encounter for general adult medical examination without abnormal findings: Secondary | ICD-10-CM

## 2018-11-15 DIAGNOSIS — R69 Illness, unspecified: Secondary | ICD-10-CM | POA: Diagnosis not present

## 2018-11-15 MED ORDER — TRELEGY ELLIPTA 100-62.5-25 MCG/INH IN AEPB: 1.0000 | INHALATION_SPRAY | Freq: Every day | RESPIRATORY_TRACT | 0 refills | Status: DC

## 2018-11-15 NOTE — Addendum Note (Signed)
Addended by: Valerie Salts on: 11/15/2018 02:07 PM   Modules accepted: Orders

## 2018-11-15 NOTE — Assessment & Plan Note (Signed)
Plan: Pneumovax 23 today 

## 2018-11-15 NOTE — Patient Instructions (Addendum)
ContinueTrelegy Ellipta  >>> 1 puff daily in the morning >>>rinse mouth out after use  >>> This inhaler contains 3 medications that help manage her respiratory status, contact our office if you cannot afford this medication or unable to remain on this medication   Only use your albuterol as a rescue medication to be used if you can't catch your breath by resting or doing a relaxed purse lip breathing pattern.  - The less you use it, the better it will work when you need it. - Ok to use up to 2 puffs every 4 hours if you must but call for immediate appointment if use goes up over your usual need - Don't leave home without it !! (think of it like the spare tire for your car)   Note your daily symptoms >remember "red flags" for COPD:  >>>Increase in cough >>>increase in sputum production >>>increase in shortness of breath or activity  intolerance.   If you notice these symptoms, please call the office to be seen.    We recommend that you stop smoking.  >>>You need to set a quit date >>>If you have friends or family who smoke, let them know you are trying to quit and not to smoke around you or in your living environment  Smoking Cessation Resources:  1 800 QUIT NOW  >>> Patient to call this resource and utilize it to help support her quit smoking >>> Keep up your hard work with stopping smoking  You can also contact the [facility] >>>For smoking cessation classes call [phone]  We do not recommend using e-cigarettes as a form of stopping smoking  You can sign up for smoking cessation support texts and information:  >>>https://smokefree.gov/smokefreetxt   Return in about 4 months (around 03/18/2019), or if symptoms worsen or fail to improve, for Follow up with [name].   Coronavirus (COVID-19) Are you at risk?  Are you at risk for the Coronavirus (COVID-19)?  To be considered HIGH RISK for Coronavirus (COVID-19), you have to meet the  following criteria:  . Traveled to [place], [place], [place], [place] or [place]; or in the [place] to [place], [place], [place], or [place]; and have fever, cough, and shortness of breath within the last 2 weeks of travel OR . Been in close contact with a person diagnosed with COVID-19 within the last 2 weeks and have fever, cough, and shortness of breath . IF YOU DO NOT MEET THESE CRITERIA, YOU ARE CONSIDERED LOW RISK FOR COVID-19.  What to do if you are HIGH RISK for COVID-19?  Marland Kitchen If you are having a medical emergency, call 911. . Seek medical care right away. Before you go to a doctor's office, urgent care or emergency department, call ahead and tell them about your recent travel, contact with someone diagnosed with COVID-19, and your symptoms. You should receive instructions from your physician's office regarding next steps of care.  . When you arrive at healthcare provider, tell the healthcare staff immediately you have returned from visiting [place], [place], [place], [place] or [place]; or traveled in the [place] to [place], [place], [place], or [place]; in the last two weeks or you have been in close contact with a person diagnosed with COVID-19 in the last 2 weeks.   . Tell the health care staff about your symptoms: fever, cough and shortness of breath. . After you have been seen by a medical provider, you will be either: o  Tested for (COVID-19) and discharged home on quarantine except to seek medical care if symptoms worsen, and asked to  - Stay home and avoid contact with others until you get your results (4-5 days)  - Avoid travel on public transportation if possible (such as bus, train, or airplane) or o Sent to the Emergency Department by EMS for evaluation, COVID-19 testing, and possible admission depending on your condition and test results.  What to do if you are LOW RISK for COVID-19?  Reduce your risk of any infection by using the same precautions used  for avoiding the common cold or flu:  Marland Kitchen Wash your hands often with soap and warm water for at least 20 seconds.  If soap and water are not readily available, use an alcohol-based hand sanitizer with at least 60% alcohol.  . If coughing or sneezing, cover your mouth and nose by coughing or sneezing into the elbow areas of your shirt or coat, into a tissue or into your sleeve (not your hands). . Avoid shaking hands with others and consider head nods or verbal greetings only. . Avoid touching your eyes, nose, or mouth with unwashed hands.  . Avoid close contact with people who are sick. . Avoid places or events with large numbers of people in one location, like concerts or sporting events. . Carefully consider travel plans you have or are making. . If you are planning any travel outside or inside the [place], visit the CDC's Travelers' Health webpage for the latest health notices. . If you have some symptoms but not all symptoms, continue to monitor at home and seek medical attention if your symptoms worsen. . If you are having a medical emergency, call 911.   [place] / e-Visit: [email or web address]         [facility]: [facility]: [id number]                   [facility]: [id number]           It is flu season:   >>> Best ways to protect herself from the flu: Receive the yearly flu vaccine, practice good hand hygiene washing with soap and also using hand sanitizer when available, eat a nutritious meals, get adequate rest, hydrate appropriately   Please contact the office if your symptoms worsen or you have concerns that you are not improving.   Thank you for choosing [facility] for your healthcare, and for allowing [place] to partner with you on your healthcare journey. I am thankful to be able to provide care to you today.    [name]

## 2018-11-15 NOTE — Assessment & Plan Note (Signed)
Plan: We recommend that you stop smoking Contact our office if you believe that you are ready to stop smoking Continuing to smoke will make it difficult for [place] to manage her breathing

## 2018-11-15 NOTE — Progress Notes (Signed)
@Patient  ID: Rachel Wang, female    DOB: May 20, 1950, 68 y.o.   MRN: [id number]  Chief Complaint  Patient presents with  . Follow-up    3 month f/u for emphysema. States her breathing has been ok since last visit. States Trelegy has really helped her breathing.     Referring provider: Rosemarie Ax, MD  HPI:  68 year old female current every day smoker followed in our office for emphysema  PMH: GERD, hyperlipidemia Smoker/ Smoking History: Current every day smoker. 5-10 cigarettes daily. 25-pack-year smoking history. Maintenance: Trelegy Ellipta   Pt of: Dr. Chase Caller  11/15/2018  - Visit   68 year old female current every day smoker followed in our office for emphysema.  Patient is completing a follow-up with her office today.  She is still maintained on Trelegy Ellipta.  Patient continues to smoke.  She reports she smokes between 5 to 10 cigarettes a day.  She has considered stopping smoking but she does not believe that she is ready to do that yet.  She is not ready to set a quit date or to attempt to stop smoking.  Overall she reports that she is doing well physically and feels that her breathing is at baseline.  Questionaires / Pulmonary Flowsheets:   MMRC: mMRC Dyspnea Scale mMRC Score  11/15/2018 1    Tests:   09/18/2017-CT chest without contrast-diffuse bronchial wall thickening and mild centrilobular and paraseptal emphysema, few scattered areas of bronchiectasis, multiple tiny pulmonary nodules in lungs bilaterally measuring 5 mm or less, stable from prior study  08/31/2017-pulmonary function test- FVC 1.51 (58% predicted), postbronchodilator ratio 85, FEV1 65, no bronchodilator response, DLCO 54   FENO:  No results found for: NITRICOXIDE  PFT: PFT Results Latest Ref Rng & Units 08/31/2017 12/28/2015  FVC-Pre L 1.51 1.66  FVC-Predicted Pre % 58 63  FVC-Post L 1.54 1.68  FVC-Predicted Post % 59 63  Pre FEV1/FVC % % 86 75  Post FEV1/FCV % % 85 78   FEV1-Pre L 1.29 1.24  FEV1-Predicted Pre % 64 60  FEV1-Post L 1.31 1.30  DLCO UNC% % 53 37  DLCO COR %Predicted % 95 78  TLC L - 3.52  TLC % Predicted % - 67  RV % Predicted % - 89    WALK:  No flowsheet data found.  Imaging: No results found.  Lab Results:  CBC    Component Value Date/Time   WBC 9.2 07/19/2017 0505   RBC 4.50 07/19/2017 0505   HGB 13.0 07/19/2017 0505   HCT 40.8 07/19/2017 0505   PLT 280 07/19/2017 0505   MCV 90.7 07/19/2017 0505   MCH 28.9 07/19/2017 0505   MCHC 31.9 07/19/2017 0505   RDW 14.6 07/19/2017 0505   LYMPHSABS 1.7 07/18/2017 1420   MONOABS 0.4 07/18/2017 1420   EOSABS 0.1 07/18/2017 1420   BASOSABS 0.0 07/18/2017 1420    BMET    Component Value Date/Time   NA 141 07/19/2017 0505   K 3.4 (L) 07/19/2017 0505   CL 106 07/19/2017 0505   CO2 25 07/19/2017 0505   GLUCOSE 158 (H) 07/19/2017 0505   BUN 6 07/19/2017 0505   CREATININE 0.76 07/19/2017 0505   CALCIUM 9.4 07/19/2017 0505   GFRNONAA >60 07/19/2017 0505   GFRAA >60 07/19/2017 0505    BNP No results found for: BNP  ProBNP No results found for: PROBNP  Specialty Problems      Pulmonary Problems   COPD exacerbation (HCC)   COPD, moderate ([place])  COPD with acute exacerbation (HCC)   Centrilobular emphysema ([employer])    09/18/2017-CT chest without contrast-diffuse bronchial wall thickening and mild centrilobular and paraseptal emphysema, few scattered areas of bronchiectasis, multiple tiny pulmonary nodules in lungs bilaterally measuring 5 mm or less, stable from prior study  08/31/2017-pulmonary function test- FVC 1.51 (58% predicted), postbronchodilator ratio 85, FEV1 65, no bronchodilator response, DLCO 54         Allergies  Allergen Reactions  . Levofloxacin Nausea And Vomiting  . Tramadol Nausea And Vomiting  . Adhesive [Tape] Rash and Other (See Comments)    WELTS, also  . Latex Rash and Other (See Comments)    WELTS, also    Immunization History   Administered Date(s) Administered  . Pneumococcal Conjugate-13 04/03/2017   Refuses flu vaccine today  Needs Pneumovax23 today  Past Medical History:  Diagnosis Date  . Bronchitis   . COPD (chronic obstructive pulmonary disease) ([place])   . Emphysema of lung ([place])   . Hyperlipidemia   . Stroke [facility])     Tobacco History: Social History   Tobacco Use  Smoking Status Current Some Day Smoker  . Packs/day: 0.50  . Years: 51.00  . Pack years: 25.50  . Types: Cigarettes  . Start date: 04/08/1967  Smokeless Tobacco Never Used   Ready to quit: No Counseling given: Yes  Smoking assessment and cessation counseling  Patient currently smoking: 5-10 cigarette I have advised the patient to quit/stop smoking as soon as possible due to high risk for multiple medical problems.  It will also be very difficult for [place] to manage patient's  respiratory symptoms and status if we continue to expose her lungs to a known irritant.  We do not advise e-cigarettes as a form of stopping smoking.  Patient is not willing to quit smoking.   I have advised the patient that we can assist and have options of nicotine replacement therapy, provided smoking cessation education today, provided smoking cessation counseling, and provided cessation resources.  Follow-up next office visit office visit for assessment of smoking cessation.  Smoking cessation counseling advised for: 4 min    Outpatient Encounter Medications as of 11/15/2018  Medication Sig  . albuterol (PROVENTIL HFA;VENTOLIN HFA) 108 (90 Base) MCG/ACT inhaler Inhale 2 puffs into the lungs every 4 (four) hours as needed for wheezing or shortness of breath.  Marland Kitchen aspirin 81 MG chewable tablet Chew 81 mg by mouth daily.  . bisoprolol (ZEBETA) 5 MG tablet Take 2.5 mg by mouth daily.  . calcium carbonate (TUMS - DOSED IN MG ELEMENTAL CALCIUM) 500 MG chewable tablet Chew 1-2 tablets by mouth as needed for indigestion or heartburn.  .  Fluticasone-Umeclidin-Vilant (TRELEGY ELLIPTA) 100-62.5-25 MCG/INH AEPB Inhale 1 puff into the lungs daily.  Marland Kitchen guaifenesin (ROBITUSSIN) 100 MG/5ML syrup Take 200 mg by mouth 3 (three) times daily as needed (to loosen phlegm).  . simvastatin (ZOCOR) 40 MG tablet Take 1 tablet (40 mg total) by mouth daily. Must keep scheduled appt for future refills  . albuterol (PROVENTIL) (2.5 MG/3ML) 0.083% nebulizer solution Take 3 mLs (2.5 mg total) by nebulization every 6 (six) hours as needed for wheezing or shortness of breath.  . [DISCONTINUED] Fluticasone-Umeclidin-Vilant (TRELEGY ELLIPTA) 100-62.5-25 MCG/INH AEPB Inhale 1 puff into the lungs daily.  . [DISCONTINUED] Tiotropium Bromide Monohydrate (SPIRIVA RESPIMAT) 2.5 MCG/ACT AERS Inhale 2 puffs into the lungs daily. (Patient not taking: Reported on 08/13/2018)   No facility-administered encounter medications on file as of 11/15/2018.  Review of Systems  Review of Systems  Constitutional: Positive for fatigue. Negative for activity change and fever.  HENT: Negative for sinus pressure, sinus pain and sore throat.   Respiratory: Negative for cough, shortness of breath and wheezing.   Cardiovascular: Negative for chest pain and palpitations.  Gastrointestinal: Positive for diarrhea (recent diarrhea, now resolved). Negative for nausea and vomiting.  Musculoskeletal: Negative for arthralgias.  Neurological: Negative for dizziness.  Psychiatric/Behavioral: Negative for sleep disturbance. The patient is not nervous/anxious.      Physical Exam  BP 118/72 (BP Location: Left Arm, Patient Position: Sitting, Cuff Size: Normal)   Pulse 87   Temp (!) 97.1 F (36.2 C) (Temporal)   Ht 5\' 5"  (1.651 m)   Wt 190 lb (86.2 kg)   SpO2 95%   BMI 31.62 kg/m   Wt Readings from Last 5 Encounters:  11/15/18 190 lb (86.2 kg)  08/13/18 193 lb 9.6 oz (87.8 kg)  07/19/18 180 lb (81.6 kg)  04/08/18 191 lb 6.4 oz (86.8 kg)  01/07/18 182 lb 9.6 oz (82.8 kg)     BMI Readings from Last 5 Encounters:  11/15/18 31.62 kg/m  08/13/18 32.22 kg/m  07/19/18 29.95 kg/m  04/08/18 31.85 kg/m  01/07/18 30.39 kg/m     Physical Exam Vitals signs and nursing note reviewed.  Constitutional:      General: She is not in acute distress.    Appearance: Normal appearance.  HENT:     Head: Normocephalic and atraumatic.     Right Ear: Tympanic membrane, ear canal and external ear normal. There is no impacted cerumen.     Left Ear: Tympanic membrane, ear canal and external ear normal. There is no impacted cerumen.     Nose: Nose normal.     Mouth/Throat:     Mouth: Mucous membranes are moist.     Pharynx: Oropharynx is clear.  Eyes:     Pupils: Pupils are equal, round, and reactive to light.  Neck:     Musculoskeletal: Normal range of motion.  Cardiovascular:     Rate and Rhythm: Normal rate and regular rhythm.     Pulses: Normal pulses.     Heart sounds: Normal heart sounds. No murmur.  Pulmonary:     Effort: Pulmonary effort is normal. No respiratory distress.     Breath sounds: Normal breath sounds. No decreased air movement. No decreased breath sounds, wheezing or rales.  Abdominal:     General: Abdomen is flat. Bowel sounds are normal.     Palpations: Abdomen is soft.  Skin:    General: Skin is warm and dry.     Capillary Refill: Capillary refill takes less than 2 seconds.  Neurological:     General: No focal deficit present.     Mental Status: She is alert and oriented to person, place, and time. Mental status is at baseline.     Gait: Gait normal.  Psychiatric:        Mood and Affect: Mood normal.        Behavior: Behavior normal.        Thought Content: Thought content normal.        Judgment: Judgment normal.       Assessment & Plan:   Tobacco abuse disorder Plan: We recommend that you stop smoking Contact our office if you believe that you are ready to stop smoking Continuing to smoke will make it difficult for [place] to manage  her breathing  Centrilobular emphysema ([place]) Assessment: August/2019  pulmonary function test shows DLCO 54 08/2017 CT chest shows diffuse bronchial wall thickening centrilobular and paraseptal emphysema, few scattered areas of bronchiectasis Maintained well on Trelegy Ellipta Still smoking  Plan: Continue Trelegy Ellipta Rescue inhaler as needed Emphasized again the need to stop smokingAssessment: Current every day smoker 4 cigarette smoke today  Plan:  Emphasized again need to stop smoking Could consider Wellbutrin in the future to help with curbing smoking cravings  Healthcare maintenance Plan:  Pneumovax23 today    Return in about 4 months (around 03/18/2019), or if symptoms worsen or fail to improve, for Follow up with [name].   Lauraine Rinne, NP 11/15/2018   This appointment was 28 minutes long with over 50% of the time in direct face-to-face patient care, assessment, plan of care, and follow-up.

## 2018-11-15 NOTE — Assessment & Plan Note (Addendum)
Assessment: August/2019 pulmonary function test shows DLCO 54 08/2017 CT chest shows diffuse bronchial wall thickening centrilobular and paraseptal emphysema, few scattered areas of bronchiectasis Maintained well on Trelegy Ellipta Still smoking  Plan: Continue Trelegy Ellipta Rescue inhaler as needed Emphasized again the need to stop smokingAssessment: Current every day smoker 4 cigarette smoke today  Plan:  Emphasized again need to stop smoking Could consider Wellbutrin in the future to help with curbing smoking cravings

## 2018-12-15 ENCOUNTER — Other Ambulatory Visit: Payer: Medicare HMO

## 2018-12-16 DIAGNOSIS — J449 Chronic obstructive pulmonary disease, unspecified: Secondary | ICD-10-CM | POA: Diagnosis not present

## 2018-12-29 ENCOUNTER — Other Ambulatory Visit: Payer: Self-pay

## 2018-12-29 ENCOUNTER — Ambulatory Visit (INDEPENDENT_AMBULATORY_CARE_PROVIDER_SITE_OTHER): Payer: Medicare HMO

## 2018-12-29 DIAGNOSIS — I714 Abdominal aortic aneurysm, without rupture, unspecified: Secondary | ICD-10-CM

## 2018-12-29 DIAGNOSIS — I700 Atherosclerosis of aorta: Secondary | ICD-10-CM | POA: Diagnosis not present

## 2019-01-15 DIAGNOSIS — J449 Chronic obstructive pulmonary disease, unspecified: Secondary | ICD-10-CM | POA: Diagnosis not present

## 2019-01-25 ENCOUNTER — Other Ambulatory Visit: Payer: Self-pay

## 2019-01-25 MED ORDER — BISOPROLOL FUMARATE 5 MG PO TABS: 2.5000 mg | ORAL_TABLET | Freq: Every day | ORAL | 1 refills | Status: DC

## 2019-02-03 IMAGING — DX DG CHEST 2V
2 series · 2 of 2 positions shown · non-contrast
Comparison: Chest CT December 04, 2015

CLINICAL DATA: Shortness of breath with cough and congestion

EXAM:
CHEST  2 VIEW

[chest pa]
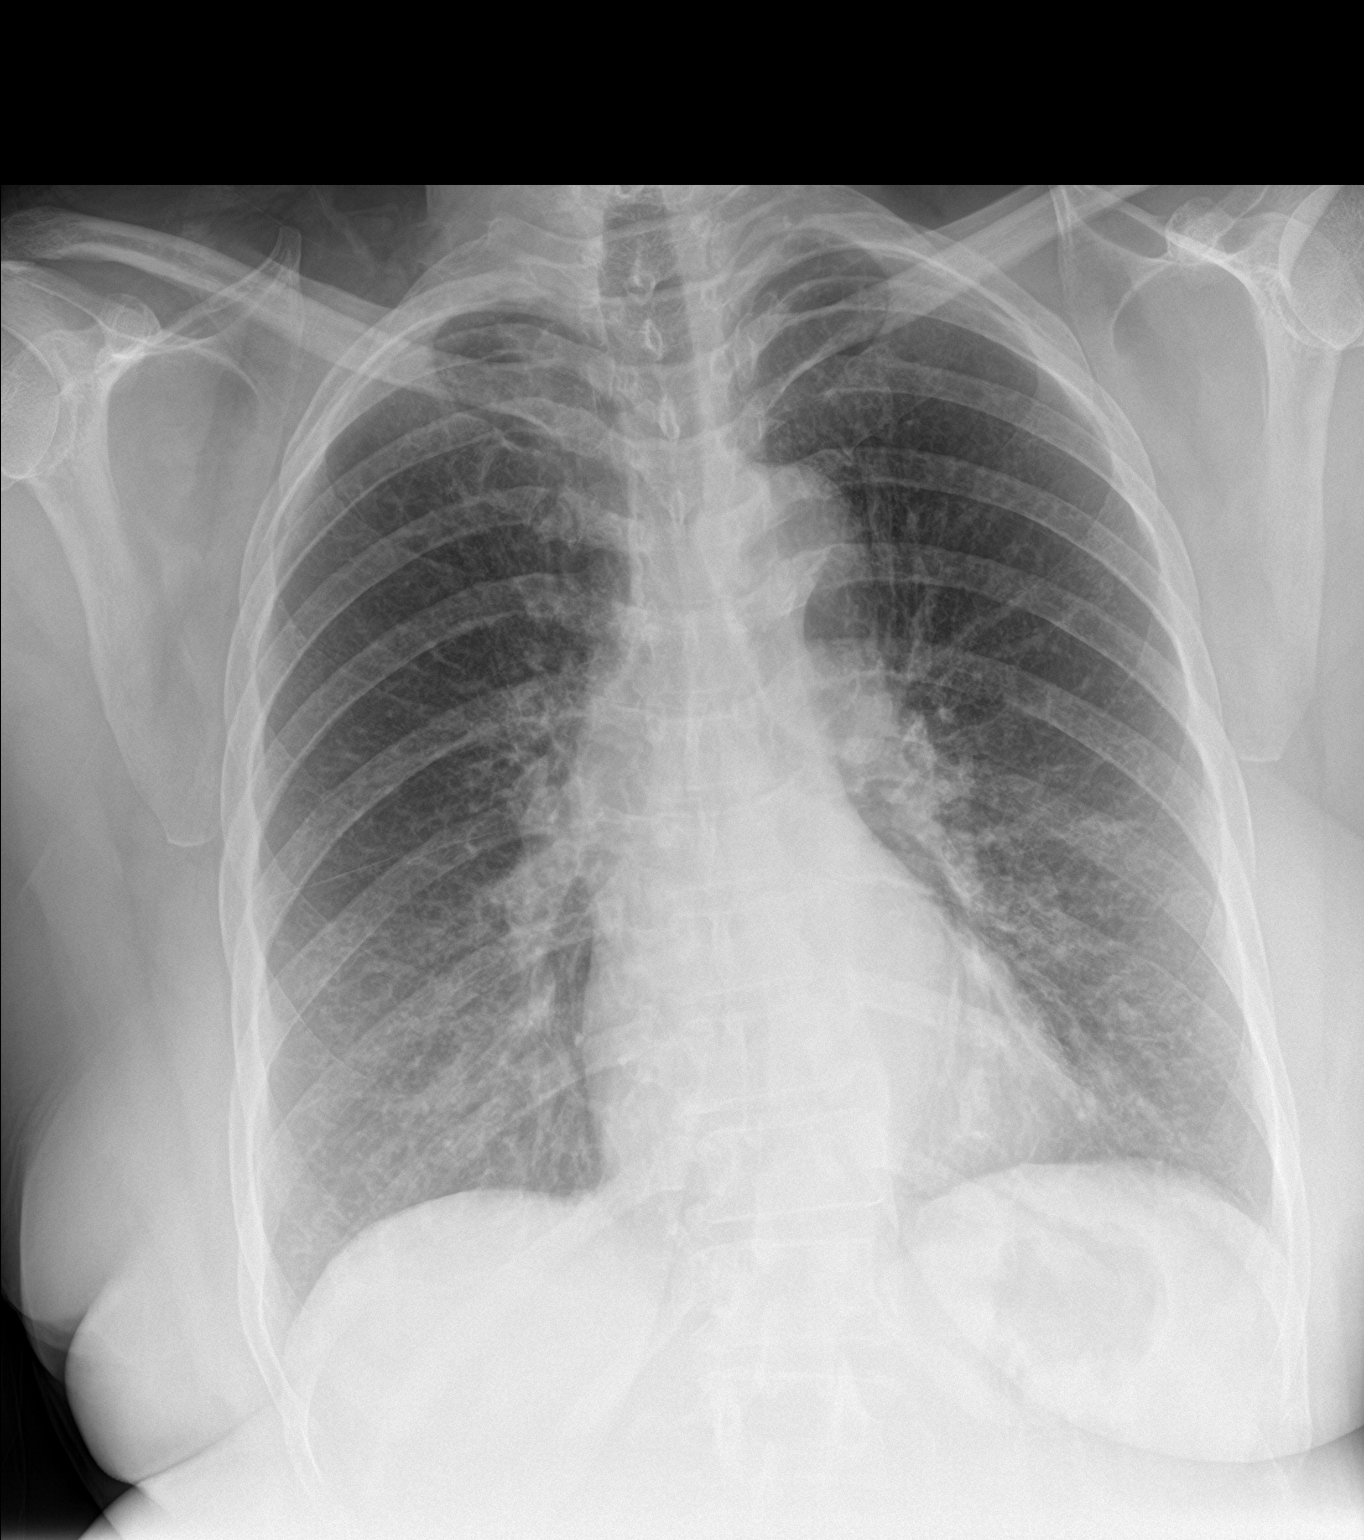

[chest lat]
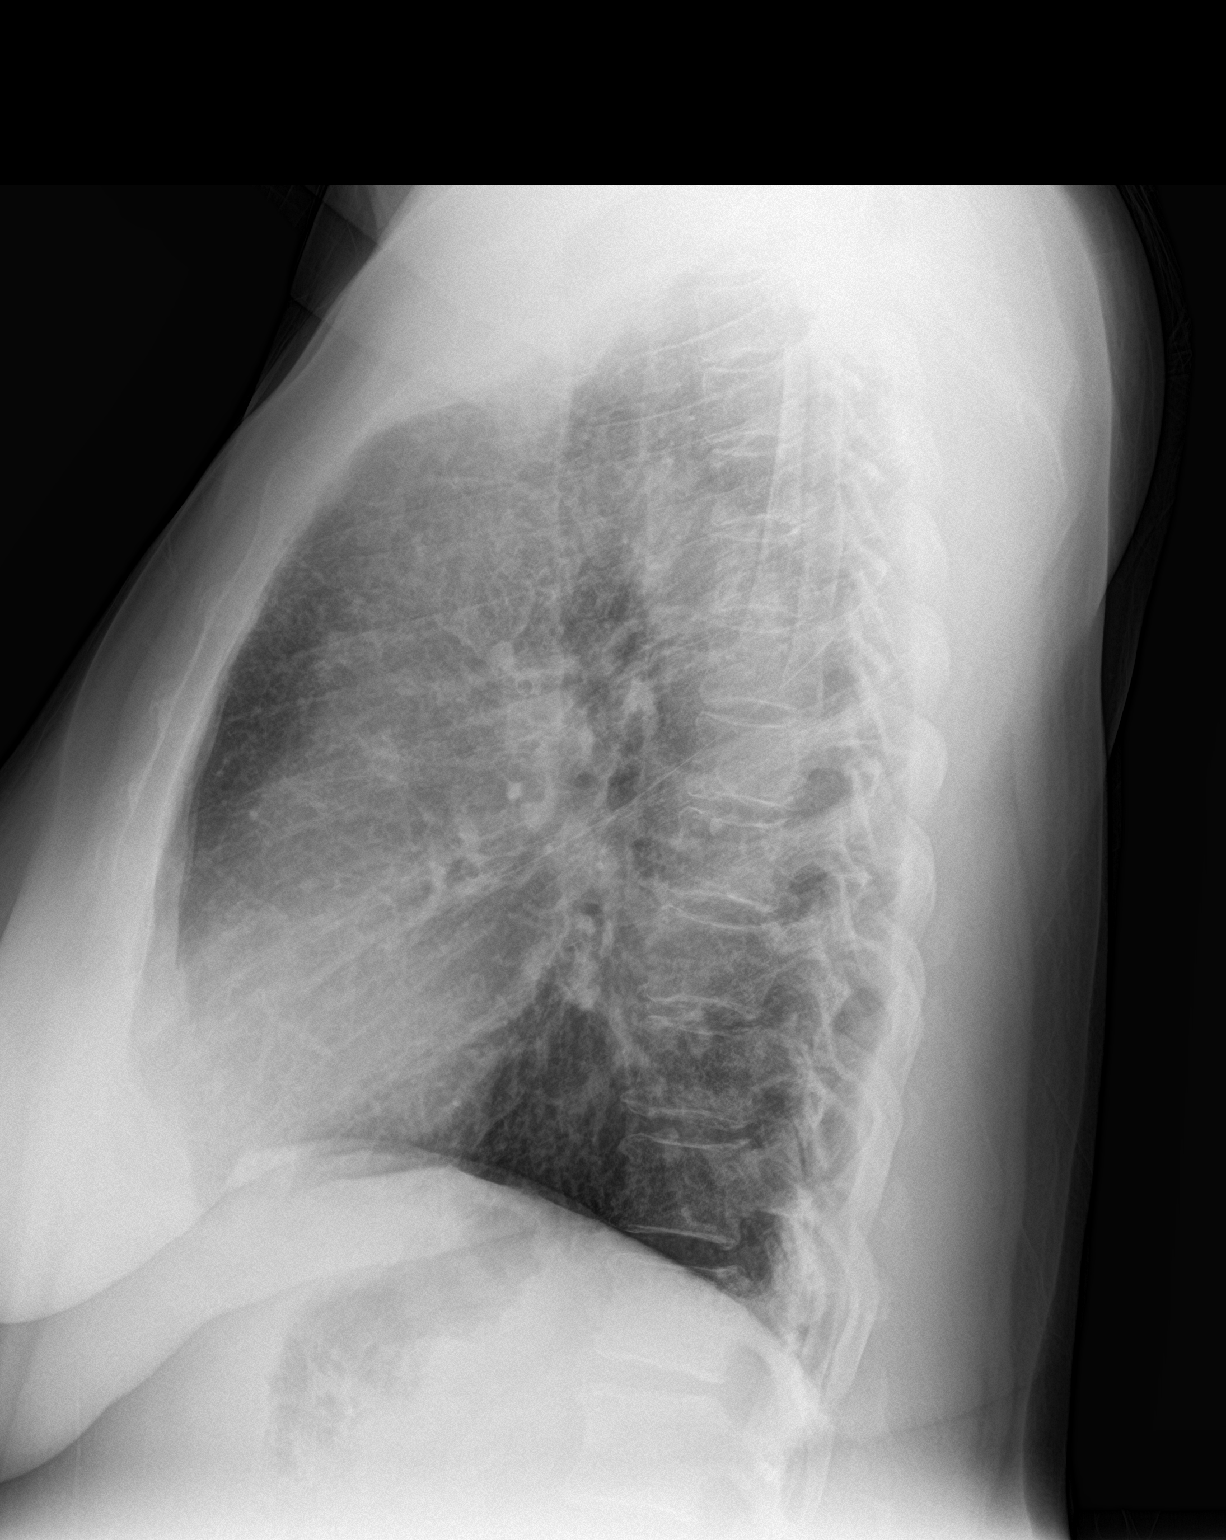

[2 of 2 positions shown; findings below may reference images not displayed]

FINDINGS: There is persistent interstitial thickening throughout the mid and
lower lung zones. There is no edema or consolidation. The heart size
and pulmonary vascularity are normal. No adenopathy. There is aortic
atherosclerosis. No evident bone lesions.
IMPRESSION: Interstitial thickening throughout the mid and lower lung zones. No
edema or consolidation. Heart size within normal limits. There is
aortic atherosclerosis.

Aortic Atherosclerosis ([id number]).

## 2019-02-11 IMAGING — DX DG CERVICAL SPINE COMPLETE 4+V
6 series · 6 of 6 positions shown · non-contrast
Comparison: No recent.

CLINICAL DATA: Injury.  Neck pain with radiation down right arm.

EXAM:
CERVICAL SPINE - COMPLETE 4+ VIEW

[c-spine lat]
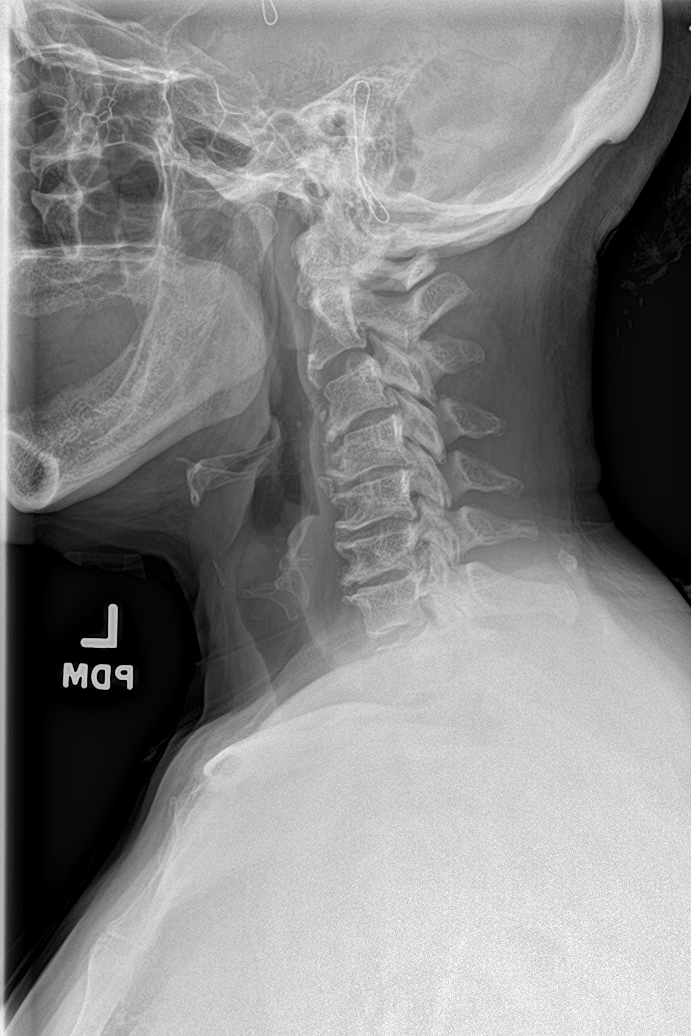

[c-spine obl (1 of 2)]
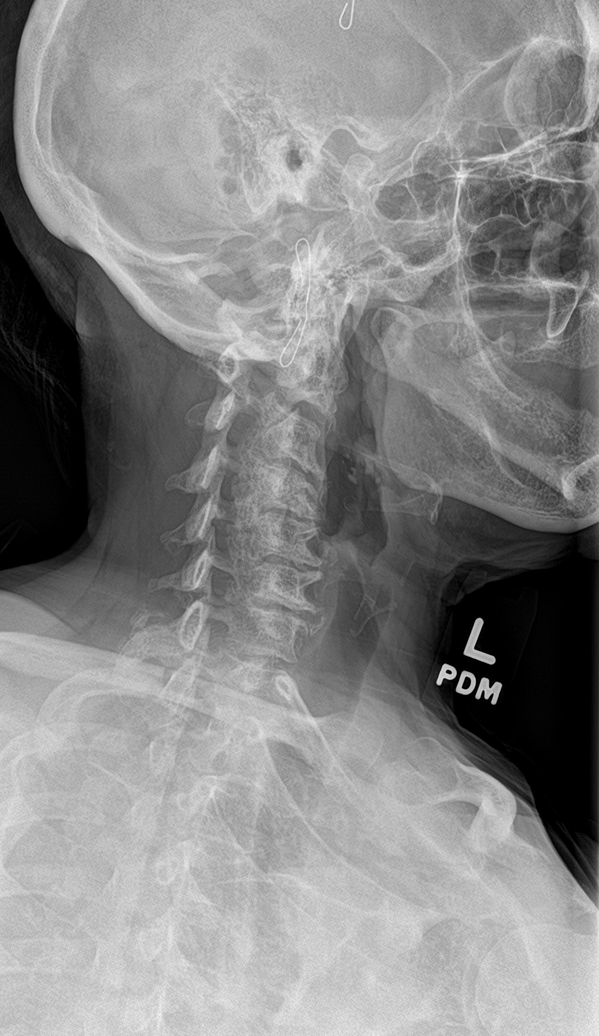

[c-spine obl (2 of 2)]
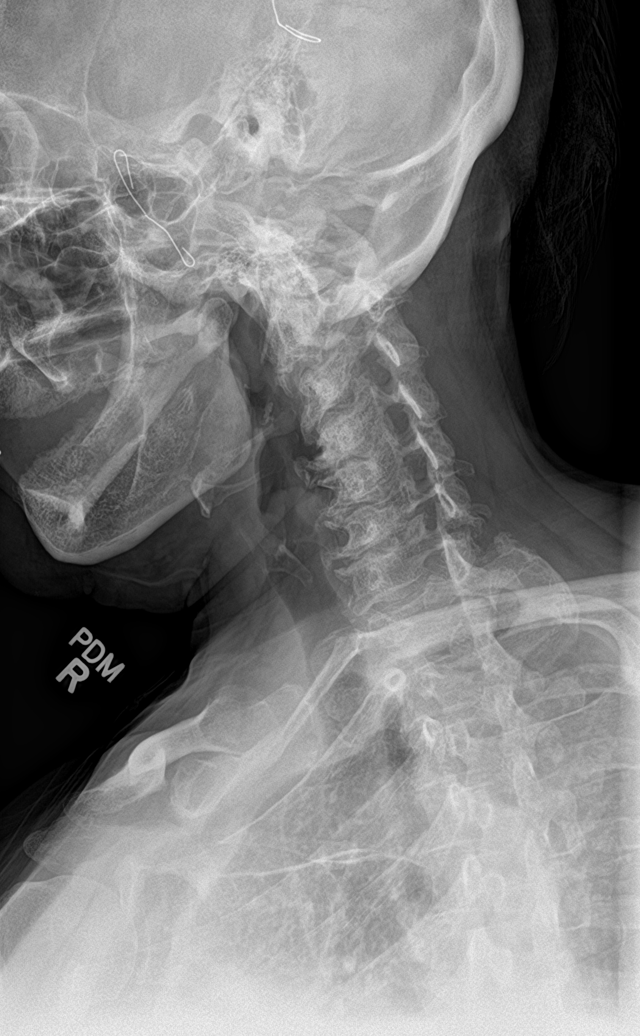

[c-spine ap]
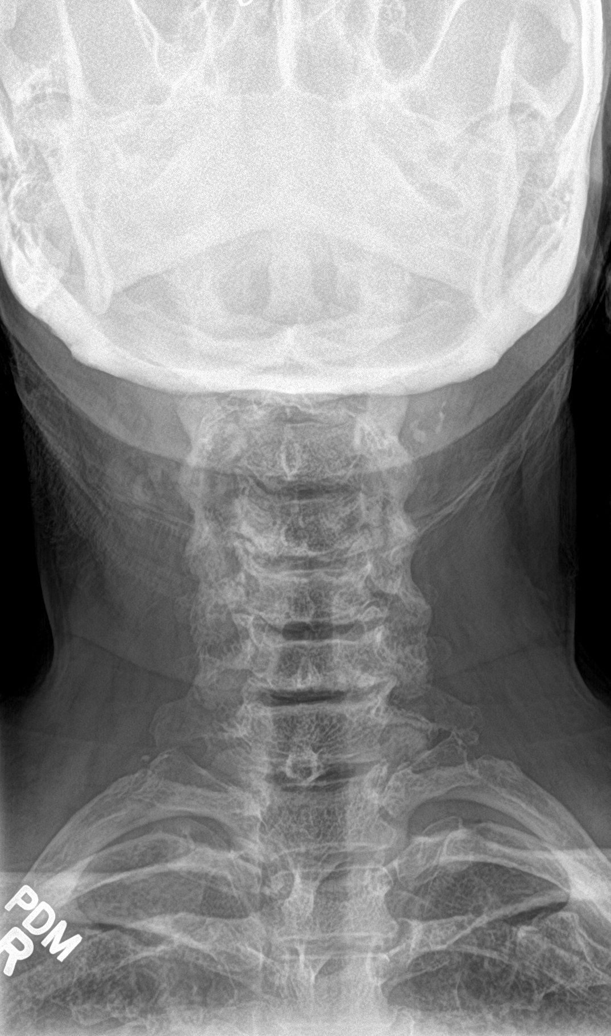

[c-spine open mouth]
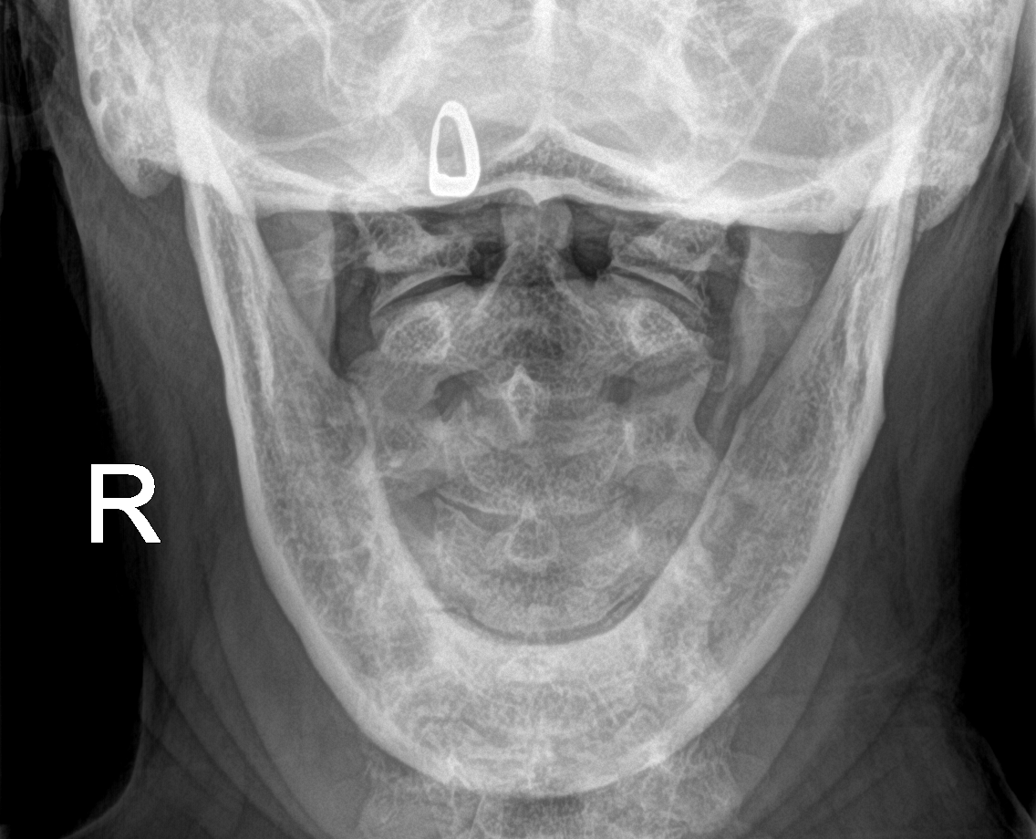

[swimmer]
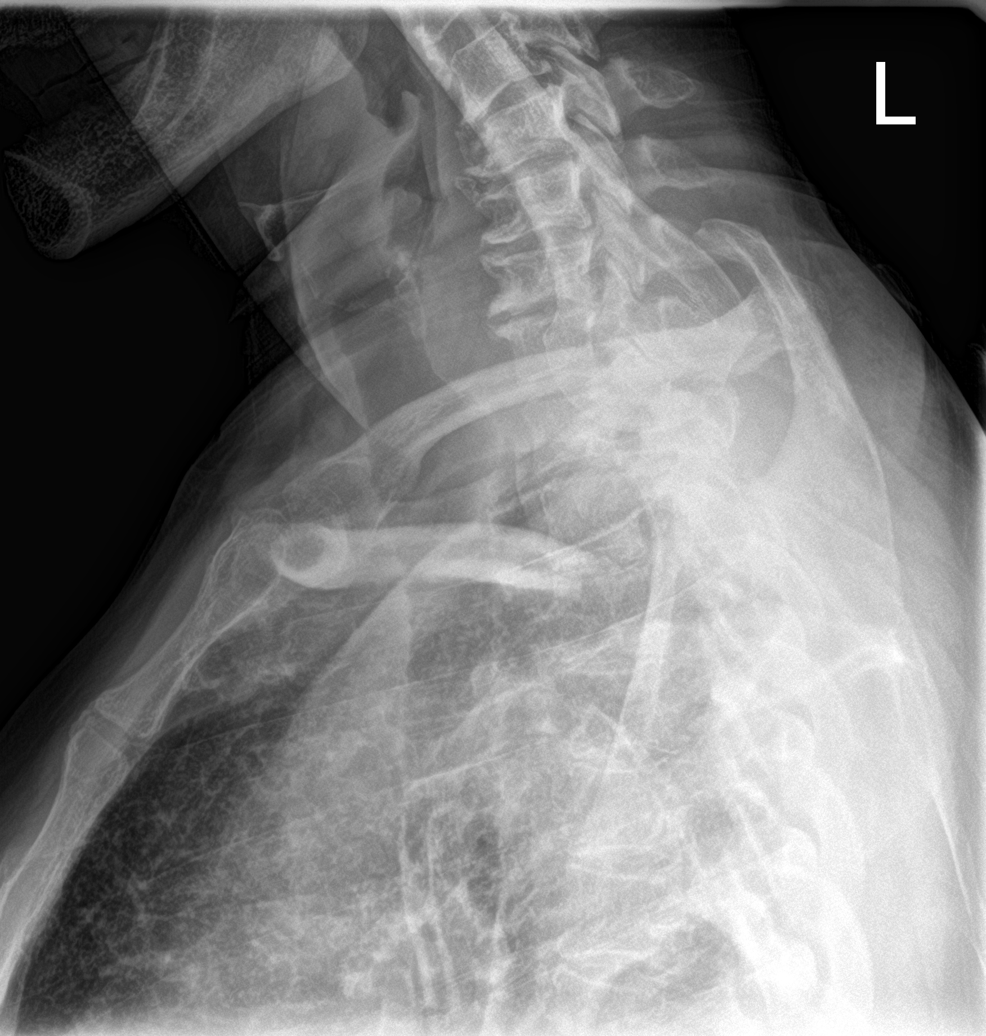

[6 of 6 positions shown; findings below may reference images not displayed]

FINDINGS: Loss of normal cervical lordosis. No acute bony abnormality
identified. Multilevel degenerative change. Ligamentous
ossification. Mild bilateral apical pleural thickening consistent
scarring. Carotid vascular calcification.
IMPRESSION: 1. Loss of normal cervical lordosis with multilevel degenerative
change. No evidence of fracture or dislocation.

2.  Carotid vascular disease.

## 2019-02-15 DIAGNOSIS — J449 Chronic obstructive pulmonary disease, unspecified: Secondary | ICD-10-CM | POA: Diagnosis not present

## 2019-03-17 ENCOUNTER — Ambulatory Visit: Payer: Medicare HMO | Admitting: Pulmonary Disease

## 2019-03-18 DIAGNOSIS — J449 Chronic obstructive pulmonary disease, unspecified: Secondary | ICD-10-CM | POA: Diagnosis not present

## 2019-03-25 ENCOUNTER — Ambulatory Visit (INDEPENDENT_AMBULATORY_CARE_PROVIDER_SITE_OTHER): Payer: Medicare HMO | Admitting: Pulmonary Disease

## 2019-03-25 ENCOUNTER — Encounter: Payer: Self-pay | Admitting: Pulmonary Disease

## 2019-03-25 ENCOUNTER — Other Ambulatory Visit: Payer: Self-pay

## 2019-03-25 DIAGNOSIS — J432 Centrilobular emphysema: Secondary | ICD-10-CM | POA: Diagnosis not present

## 2019-03-25 DIAGNOSIS — Z720 Tobacco use: Secondary | ICD-10-CM

## 2019-03-25 DIAGNOSIS — F17210 Nicotine dependence, cigarettes, uncomplicated: Secondary | ICD-10-CM | POA: Diagnosis not present

## 2019-03-25 DIAGNOSIS — R69 Illness, unspecified: Secondary | ICD-10-CM | POA: Diagnosis not present

## 2019-03-25 MED ORDER — TRELEGY ELLIPTA 100-62.5-25 MCG/INH IN AEPB: 1.0000 | INHALATION_SPRAY | Freq: Every day | RESPIRATORY_TRACT | 5 refills | Status: DC

## 2019-03-25 NOTE — Progress Notes (Signed)
Virtual Visit via Telephone Note  I connected with Rachel Wang on 03/25/19 at  1:30 PM EST by telephone and verified that I am speaking with the correct person using two identifiers.  Location: Patient: Home Provider: [facility] - [id number], [place], [place], [place] [address]   I discussed the limitations, risks, security and privacy concerns of performing an evaluation and management service by telephone and the availability of in person appointments. I also discussed with the patient that there may be a patient responsible charge related to this service. The patient expressed understanding and agreed to proceed.  Patient consented to consult via telephone: Yes People present and their role in pt care: Pt     History of Present Illness:  69 year old female current every day smoker followed in our office for emphysema  PMH: GERD, hyperlipidemia Smoker/ Smoking History: Current every day smoker. 2 cigarettes daily. 25-pack-year smoking history. Maintenance: Trelegy Ellipta   Pt of: Dr. Chase Caller  Chief complaint: 4 month follow up   70 year old female current everyday smoker followed in our office for emphysema.  Patient completing 71-month follow-up with her office today.  Patient reports that she has been doing quite well since last being seen.  She has made progress in reducing her smoking from 4 to 5 cigarettes a day to 2 cigarettes daily.  She is not interested in assistance with smoking cessation.  She is using the reduced to quit method which she feels works well for her.  Has maintained well on trelegy Ellipta.  She would like a refill today.  Patient has not received the COVID-19 vaccine, she is not interested in receiving it.  We will discuss today.  Smoking assessment and cessation counseling  Patient currently smoking: 2 cigarettes  I have advised the patient to quit/stop smoking as soon as possible due to high risk for multiple medical  problems.  It will also be very difficult for [place] to manage patient's  respiratory symptoms and status if we continue to expose her lungs to a known irritant.  We do not advise e-cigarettes as a form of stopping smoking.  Patient is willing to quit smoking. Hasnt set quit date.   Goal set to have patient be down to 0 cigarettes Bidex follow-up with her office in 4 months  Offered nicotine replacement therapies today, patient declined Offered Wellbutrin prescription, patient declined Patient prefers reduced to quit method.  I have advised the patient that we can assist and have options of nicotine replacement therapy, provided smoking cessation education today, provided smoking cessation counseling, and provided cessation resources.  Follow-up next office visit office visit for assessment of smoking cessation.   Smoking cessation counseling advised for: 4 min    Observations/Objective:  Social History   Tobacco Use  Smoking Status Current Some Day Smoker  . Packs/day: 0.50  . Years: 51.00  . Pack years: 25.50  . Types: Cigarettes  . Start date: 04/08/1967  Smokeless Tobacco Never Used   Immunization History  Administered Date(s) Administered  . Pneumococcal Conjugate-13 04/03/2017  . Pneumococcal Polysaccharide-23 11/15/2018   Recommend the covid vaccine.   Assessment and Plan:  Centrilobular emphysema ([place]) Assessment: August/2019 pulmonary function test shows DLCO 54 08/2017 CT chest shows diffuse bronchial wall thickening centrilobular and paraseptal emphysema, few scattered areas of bronchiectasis Maintained well on Trelegy Ellipta Still smoking 2 cigarette  Plan: Continue Trelegy Ellipta Rescue inhaler as needed Emphasized again the need to stop smoking, please try to be at  0 cigarettes a day by next office visit Follow-up in 4 months   Tobacco abuse disorder Plan: Please try to be down to 0 cigarettes at next office visit Follow-up in 4 months Offered  nicotine replacement therapies or Wellbutrin prescription, patient declined   Follow Up Instructions:  Return in about 4 months (around 07/23/2019), or if symptoms worsen or fail to improve, for Follow up with [name], Follow up with Dr. Purnell Shoemaker.   I discussed the assessment and treatment plan with the patient. The patient was provided an opportunity to ask questions and all were answered. The patient agreed with the plan and demonstrated an understanding of the instructions.   The patient was advised to call back or seek an in-person evaluation if the symptoms worsen or if the condition fails to improve as anticipated.  I provided 18 minutes of non-face-to-face time during this encounter.   Lauraine Rinne, NP

## 2019-03-25 NOTE — Patient Instructions (Addendum)
You were seen today by Rachel Rinne, NP  for:   Pleasure talking with you today over the phone.  I am glad you are doing so well.  Great job making progress decreasing your smoking.  Keep up the hard work.  Our goal should be to get down to 0 cigarettes by next follow-up.  Follow-up with me or Dr. Purnell Wang sometime over the next 4 months.  Glad you are doing well,  Rachel Wang  1. Centrilobular emphysema ([place])  - Fluticasone-Umeclidin-Vilant (TRELEGY ELLIPTA) 100-62.5-25 MCG/INH AEPB; Inhale 1 puff into the lungs daily.  Dispense: 60 each; Refill: 5  Trelegy Ellipta  >>> 1 puff daily in the morning >>>rinse mouth out after use  >>> This inhaler contains 3 medications that help manage her respiratory status, contact our office if you cannot afford this medication or unable to remain on this medication  Note your daily symptoms > remember "red flags" for COPD:   >>>Increase in cough >>>increase in sputum production >>>increase in shortness of breath or activity  intolerance.   If you notice these symptoms, please call the office to be seen.   Only use your albuterol as a rescue medication to be used if you can't catch your breath by resting or doing a relaxed purse lip breathing pattern.  - The less you use it, the better it will work when you need it. - Ok to use up to 2 puffs  every 4 hours if you must but call for immediate appointment if use goes up over your usual need - Don't leave home without it !!  (think of it like the spare tire for your car)   2. Tobacco abuse disorder  We recommend that you stop smoking.  >>>You need to set a quit date >>>If you have friends or family who smoke, let them know you are trying to quit and not to smoke around you or in your living environment  Smoking Cessation Resources:  1 800 QUIT NOW  >>> Patient to call this resource and utilize it to help support her quit smoking >>> Keep up your hard work with stopping smoking  You can also contact  the [facility] >>>For smoking cessation classes call [phone]  We do not recommend using e-cigarettes as a form of stopping smoking  You can sign up for smoking cessation support texts and information:  >>>https://smokefree.gov/smokefreetxt   Please work on continuing to decrease her smoking.  Goal should be to be down to 0 cigarettes and officially stop smoking by next office visit.  Keep up the great work!  Let me know how I can help            COVID-19 Vaccine Information can be found at: ShippingScam.co.uk For questions related to vaccine distribution or appointments, please email vaccine@[email or web address] or call [phone].     We recommend today:   Meds ordered this encounter  Medications  . Fluticasone-Umeclidin-Vilant (TRELEGY ELLIPTA) 100-62.5-25 MCG/INH AEPB    Sig: Inhale 1 puff into the lungs daily.    Dispense:  60 each    Refill:  5    Follow Up:    Return in about 4 months (around 07/23/2019), or if symptoms worsen or fail to improve, for Follow up with Rachel Quaker FNP-C, Follow up with Dr. Purnell Wang.   Please do your part to reduce the spread of COVID-19:      Reduce your risk of any infection  and COVID19 by using the similar precautions used for  avoiding the common cold or flu:  Marland Kitchen Wash your hands often with soap and warm water for at least 20 seconds.  If soap and water are not readily available, use an alcohol-based hand sanitizer with at least 60% alcohol.  . If coughing or sneezing, cover your mouth and nose by coughing or sneezing into the elbow areas of your shirt or coat, into a tissue or into your sleeve (not your hands). Rachel Wang A MASK when in public  . Avoid shaking hands with others and consider head nods or verbal greetings only. . Avoid touching your eyes, nose, or mouth with unwashed hands.  . Avoid close contact with people who are sick. . Avoid places or  events with large numbers of people in one location, like concerts or sporting events. . If you have some symptoms but not all symptoms, continue to monitor at home and seek medical attention if your symptoms worsen. . If you are having a medical emergency, call 911.   Cook / e-Visit: [email or web address]         [facility]: [facility]: [id number]                   [facility]: [id number]     It is flu season:   >>> Best ways to protect herself from the flu: Receive the yearly flu vaccine, practice good hand hygiene washing with soap and also using hand sanitizer when available, eat a nutritious meals, get adequate rest, hydrate appropriately   Please contact the office if your symptoms worsen or you have concerns that you are not improving.   Thank you for choosing [facility] for your healthcare, and for allowing [place] to partner with you on your healthcare journey. I am thankful to be able to provide care to you today.   Rachel Quaker FNP-C

## 2019-03-25 NOTE — Assessment & Plan Note (Signed)
Plan: Please try to be down to 0 cigarettes at next office visit Follow-up in 4 months Offered nicotine replacement therapies or Wellbutrin prescription, patient declined

## 2019-03-25 NOTE — Assessment & Plan Note (Signed)
Assessment: August/2019 pulmonary function test shows DLCO 54 08/2017 CT chest shows diffuse bronchial wall thickening centrilobular and paraseptal emphysema, few scattered areas of bronchiectasis Maintained well on Trelegy Ellipta Still smoking 2 cigarette  Plan: Continue Trelegy Ellipta Rescue inhaler as needed Emphasized again the need to stop smoking, please try to be at 0 cigarettes a day by next office visit Follow-up in 4 months

## 2019-04-15 DIAGNOSIS — J449 Chronic obstructive pulmonary disease, unspecified: Secondary | ICD-10-CM | POA: Diagnosis not present

## 2019-05-16 DIAGNOSIS — J449 Chronic obstructive pulmonary disease, unspecified: Secondary | ICD-10-CM | POA: Diagnosis not present

## 2019-06-15 DIAGNOSIS — J449 Chronic obstructive pulmonary disease, unspecified: Secondary | ICD-10-CM | POA: Diagnosis not present

## 2019-07-16 DIAGNOSIS — J449 Chronic obstructive pulmonary disease, unspecified: Secondary | ICD-10-CM | POA: Diagnosis not present

## 2019-07-21 ENCOUNTER — Ambulatory Visit: Payer: Medicare HMO | Admitting: Cardiology

## 2019-07-26 IMAGING — DX DG CHEST 1V PORT
1 series · 1 of 1 positions shown · non-contrast
Comparison: 01/26/2017

CLINICAL DATA: Shortness of breath 2 days on home oxygen.

EXAM:
PORTABLE CHEST 1 VIEW

[chest ap]
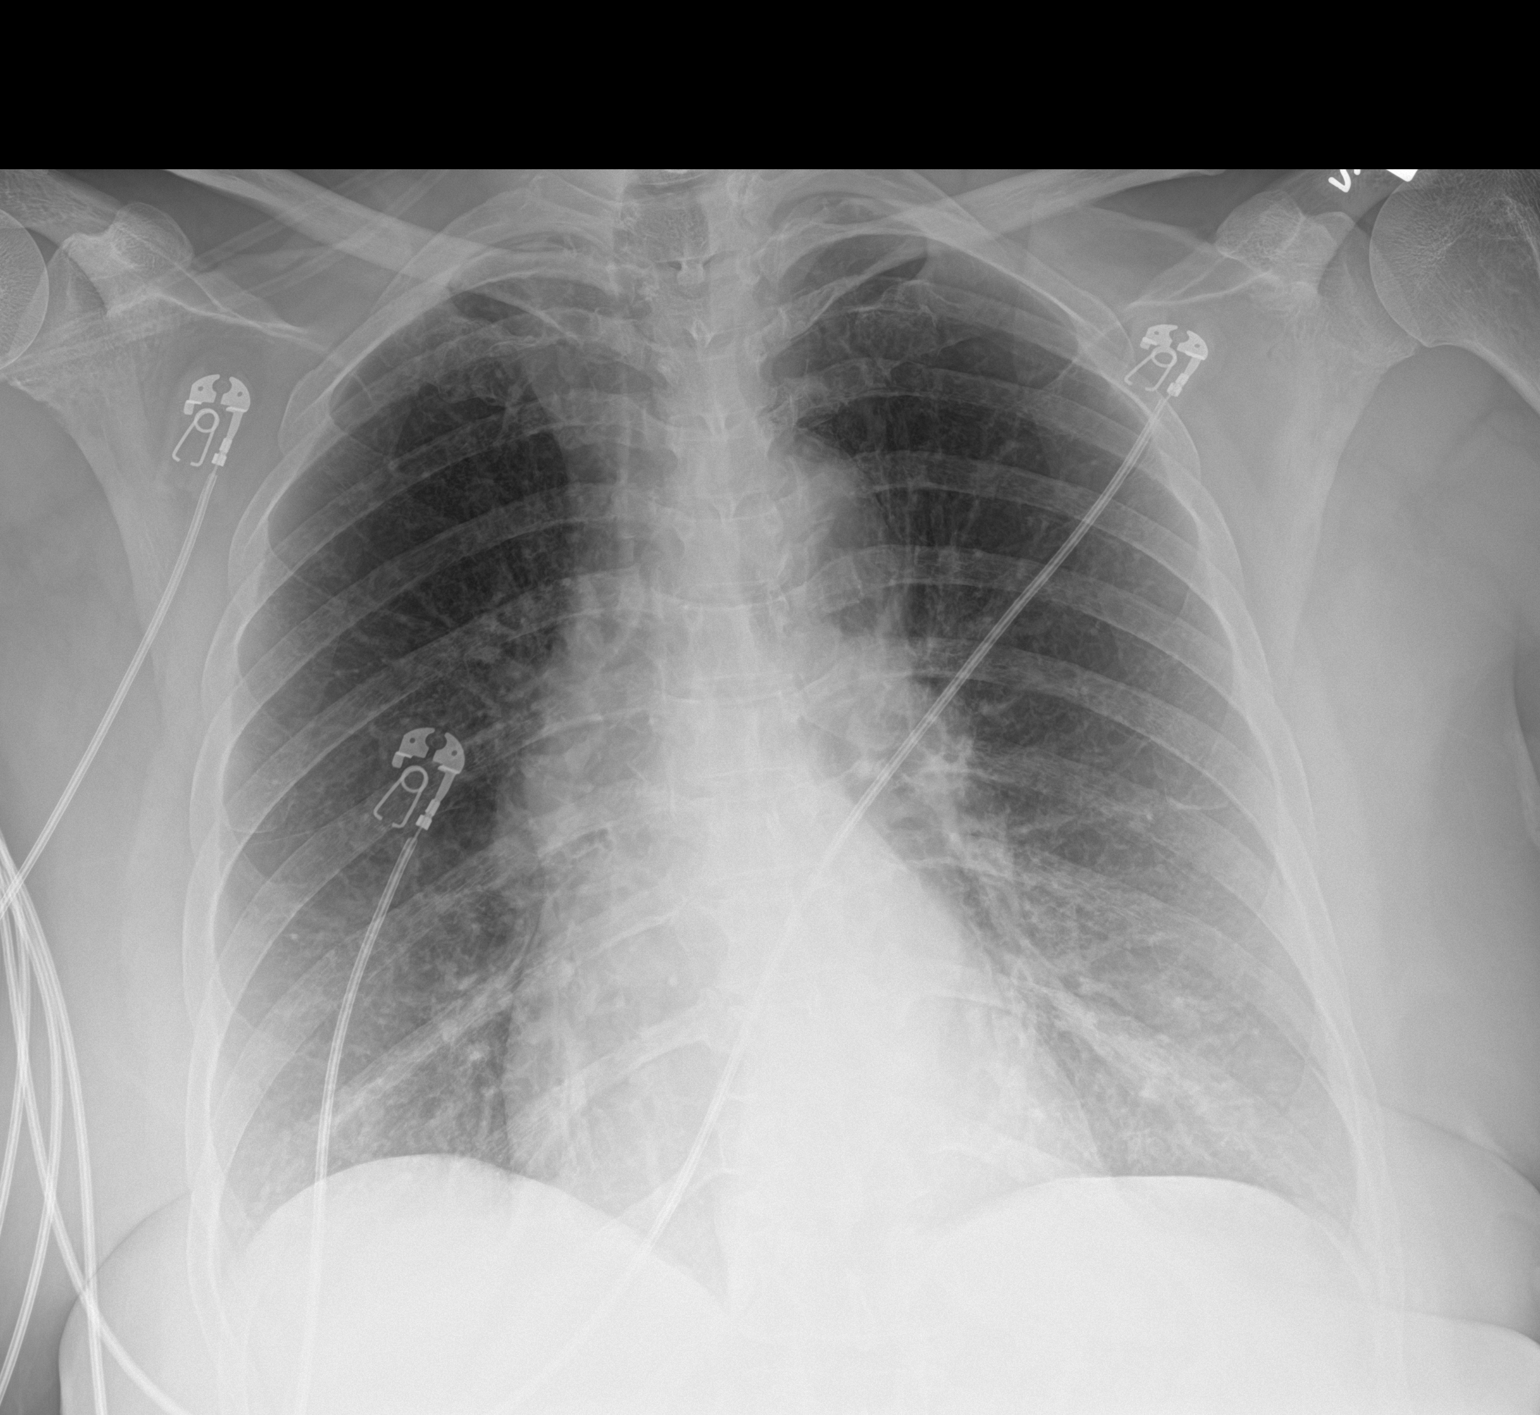

[1 of 1 positions shown; findings below may reference images not displayed]

FINDINGS: Lungs are adequately inflated without focal airspace consolidation
or effusion. Cardiomediastinal silhouette and remainder of the exam
is unchanged.
IMPRESSION: No acute cardiopulmonary disease.

## 2019-08-12 ENCOUNTER — Ambulatory Visit: Payer: Medicare HMO | Admitting: Cardiology

## 2019-08-12 ENCOUNTER — Other Ambulatory Visit: Payer: Self-pay

## 2019-08-12 ENCOUNTER — Encounter: Payer: Self-pay | Admitting: Cardiology

## 2019-08-12 VITALS — BP 114/69 | HR 96 | Resp 16 | Ht 65.0 in | Wt 192.0 lb

## 2019-08-12 DIAGNOSIS — Z720 Tobacco use: Secondary | ICD-10-CM | POA: Diagnosis not present

## 2019-08-12 DIAGNOSIS — I77811 Abdominal aortic ectasia: Secondary | ICD-10-CM | POA: Diagnosis not present

## 2019-08-12 DIAGNOSIS — I10 Essential (primary) hypertension: Secondary | ICD-10-CM

## 2019-08-12 DIAGNOSIS — E7800 Pure hypercholesterolemia, unspecified: Secondary | ICD-10-CM

## 2019-08-12 DIAGNOSIS — I739 Peripheral vascular disease, unspecified: Secondary | ICD-10-CM

## 2019-08-12 DIAGNOSIS — J432 Centrilobular emphysema: Secondary | ICD-10-CM | POA: Diagnosis not present

## 2019-08-12 MED ORDER — BISOPROLOL FUMARATE 5 MG PO TABS: 2.5000 mg | ORAL_TABLET | Freq: Every day | ORAL | 1 refills | Status: DC

## 2019-08-12 NOTE — Progress Notes (Signed)
° °Primary Physician/Referring:  Sanders, Robyn, MD ° °Patient ID: Rachel Wang, female    DOB: 01/30/1950, 69 y.o.   MRN: [id number] ° °Chief Complaint  °Patient presents with  °• AAA  °• Follow-up  °  1 year  °• Hyperlipidemia  °• Hypertension  ° ° °HPI: Pearlena Holsclaw  is a 69 y.o. female African-American female referred to me by Dr. Murli Ramaswamy for evaluation of coronary calcification noted on theCT scan performed on 09/18/2017.  She has had a negative nuclear stress in December 2019.   ° °Past medical history significant for centrilobular emphysema, hypertension and hyperlipidemia and tobacco use disorder.  She is still smoking about 2 to 3 cigarettes a day and complete abstinence discussed.  Her main complaint today is right leg cramps. This has worsened since last year.  ° °She still continues to smoke.  Unfortunately and states that it has been extremely difficult to quit. Now smoking 2-3 cigarettes a day and is trying her best to stop. ° °Past Medical History:  °Diagnosis Date  °• Bronchitis   °• COPD (chronic obstructive pulmonary disease) (HCC)   °• Emphysema of lung (HCC)   °• Hyperlipidemia   °• Stroke (HCC)   ° °History reviewed. No pertinent surgical history. ° °Social History  ° °Tobacco Use  °• Smoking status: Current Some Day Smoker  °  Packs/day: 0.50  °  Years: 51.00  °  Pack years: 25.50  °  Types: Cigarettes  °  Start date: 04/08/1967  °• Smokeless tobacco: Never Used  °Substance Use Topics  °• Alcohol use: No  ° Marital Status: Widowed  ° °Review of Systems  °Cardiovascular: Positive for claudication (right leg calf worse) and dyspnea on exertion (stable since being on Trilogy). Negative for chest pain and leg swelling.  °Musculoskeletal: Positive for muscle cramps (legs occasionally).  °Gastrointestinal: Negative for melena.  ° °   °Objective  °Blood pressure 114/69, pulse 96, resp. rate 16, height 5' 5" (1.651 m), weight 192 lb (87.1 kg), SpO2 96 %. Body mass index is 31.95 kg/m².    °Vitals with BMI 08/12/2019 11/15/2018 08/13/2018  °Height 5' 5" 5' 5" 5' 5"  °Weight 192 lbs 190 lbs 193 lbs 10 oz  °BMI 31.95 31.62 32.22  °Systolic 114 118 118  °Diastolic 69 72 70  °Pulse 96 87 95  °    °Physical Exam °Constitutional:   °   General: She is not in acute distress. °   Appearance: She is well-developed.  °Neck:  °   Thyroid: No thyromegaly.  °   Vascular: No JVD.  °Cardiovascular:  °   Rate and Rhythm: Normal rate and regular rhythm.  °   Pulses:     °     Carotid pulses are 2+ on the right side and 2+ on the left side. °     Femoral pulses are 2+ on the right side and 2+ on the left side. °     Popliteal pulses are 1+ on the right side and 1+ on the left side.  °     Dorsalis pedis pulses are 2+ on the right side and 2+ on the left side.  °     Posterior tibial pulses are 0 on the right side and 0 on the left side.  °   Heart sounds: Normal heart sounds. No murmur heard.  °No gallop.   °   Comments: Chronically ischemic skin changes both lower extremities. Feeble femoral pulse and pedal pulse   bilateral. No carotid bruit.  °No edema, no JVD °Pulmonary:  °   Effort: Pulmonary effort is normal.  °   Breath sounds: Rales (bilateral scattered) present.  °Abdominal:  °   General: Bowel sounds are normal.  °   Palpations: Abdomen is soft.  °Neurological:  °   Mental Status: She is alert.  ° ° °Radiology: °No results found. ° °Laboratory examination:  ° °01/04/2018: Cholesterol 132, triglycerides 73, HDL 47, LDL 70.  Creatinine 0.74, EGFR 84/97, potassium 3.9, CMP normal. ° °CMP Latest Ref Rng & Units 07/19/2017 07/18/2017 04/03/2017  °Glucose 65 - 99 mg/dL 158(H) 144(H) 89  °BUN 6 - 20 mg/dL 6 <3(L) 8  °Creatinine 0.44 - 1.00 mg/dL 0.76 0.70 0.80  °Sodium 135 - 145 mmol/L 141 143 139  °Potassium 3.5 - 5.1 mmol/L 3.4(L) 3.4(L) 3.4(L)  °Chloride 101 - 111 mmol/L 106 102 100  °CO2 22 - 32 mmol/L 25 - 33(H)  °Calcium 8.9 - 10.3 mg/dL 9.4 - 9.8  °Total Protein 6.0 - 8.3 g/dL - - 7.6  °Total Bilirubin 0.2 - 1.2  mg/dL - - 0.2  °Alkaline Phos 39 - 117 U/L - - 82  °AST 0 - 37 U/L - - 12  °ALT 0 - 35 U/L - - 12  ° °CBC Latest Ref Rng & Units 07/19/2017 07/18/2017 07/18/2017  °WBC 4.0 - 10.5 K/uL 9.2 - 7.5  °Hemoglobin 12.0 - 15.0 g/dL 13.0 15.0 13.8  °Hematocrit 36 - 46 % 40.8 44.0 44.2  °Platelets 150 - 400 K/uL 280 - 293  ° °Medications  ° °Medications Discontinued During This Encounter  °Medication Reason  °• Fluticasone-Umeclidin-Vilant (TRELEGY ELLIPTA) 100-62.5-25 MCG/INH AEPB Duplicate  °• guaifenesin (ROBITUSSIN) 100 MG/5ML syrup No longer needed (for PRN medications)  °• bisoprolol (ZEBETA) 5 MG tablet Reorder  ° °Current Meds  °Medication Sig  °• albuterol (PROVENTIL HFA;VENTOLIN HFA) 108 (90 Base) MCG/ACT inhaler Inhale 2 puffs into the lungs every 4 (four) hours as needed for wheezing or shortness of breath.  °• albuterol (PROVENTIL) (2.5 MG/3ML) 0.083% nebulizer solution Take 3 mLs (2.5 mg total) by nebulization every 6 (six) hours as needed for wheezing or shortness of breath.  °• aspirin 81 MG chewable tablet Chew 81 mg by mouth daily.  °• bisoprolol (ZEBETA) 5 MG tablet Take 0.5 tablets (2.5 mg total) by mouth daily.  °• calcium carbonate (TUMS - DOSED IN MG ELEMENTAL CALCIUM) 500 MG chewable tablet Chew 1-2 tablets by mouth as needed for indigestion or heartburn.  °• Fluticasone-Umeclidin-Vilant (TRELEGY ELLIPTA) 100-62.5-25 MCG/INH AEPB Inhale 1 puff into the lungs daily.  °• Multiple Vitamins-Minerals (MULTIVITAMIN ADULTS 50+ PO) Take 1 tablet by mouth daily.   °• simvastatin (ZOCOR) 40 MG tablet Take 1 tablet (40 mg total) by mouth daily. Must keep scheduled appt for future refills (Patient taking differently: Take 40 mg by mouth daily at 6 PM. )  °• [DISCONTINUED] bisoprolol (ZEBETA) 5 MG tablet Take 0.5 tablets (2.5 mg total) by mouth daily.  ° ° °Cardiac Studies:  ° °Lower extremity arterial duplex 12/11/2017: °No hemodynamically significant stenoses are identified in the  lower extremity arterial system.  There is a heavily calcified plaque in the bilateral CFA right more than left with <50% stenosis.  °This exam reveals normal perfusion of the lower extremity (ABI 1.00 bilateral).  °Normal triphasic waveforms of the right ankle. Moderately abnormal waveforms of the left ankle. Clinical correlation recommended. ° °Echocardiogram 12/11/2017: °Left ventricle cavity is normal in size. Normal left ventricular   shape. Normal global wall motion. Normal diastolic filling pattern. Calculated EF 56%. °No hemodynamically significant valvular abnormality.  °Normal right atrial pressure. ° °CT scan of the chest 09/18/2017, comparison 11/03/2015.  Aortic atherosclerosis, atherosclerosis of the great vessels of the mediastinum and three-vessel coronary artery calcification including left main. Bronchiectasis.  Centrilobular and paraseptal emphysema.  Multiple tiny pulmonary nodules. ° °Exercise myoview stress 01/04/2018:  °1. The patient performed treadmill exercise using Bruce protocol, completing 5:33 minutes. The patient completed an estimated workload of 7 METS, reaching 86% of the maximum predicted heart rate. Normal hemodynamic response was seen. Stress symptoms included chest tightness. Exercise capacity was low. No ischemic changes seen on stress electrocardiogram.  °2. The overall quality of the study is excellent. There is no evidence of abnormal lung activity. Stress and rest SPECT images demonstrate homogeneous tracer distribution throughout the myocardium. Gated SPECT imaging reveals normal myocardial thickening and wall motion. The left ventricular ejection fraction was normal (60%).   °3. Low risk study. ° °Abdominal Aortic Duplex  12/29/2018:  °The maximum aorta (sac) diameter is 2.43 cm (prox). Mild ectasia noted in  the proximal abdominal aorta.  °Mild diffuse plaque observed in the proximal, mid and distal aorta. Normal  flow velocities noted in the aorta and iliac vessels.  °Consider rescreening in 10 years for  AAA.  Previously reported AAA on  12/14/17 at 3 cm, appears to be an overestimation. Overall stable scan.  ° °EKG: ° °EKG 08/12/2019: Normal sinus rhythm at rate of 94 bpm, normal axis.  No evidence of ischemia, normal EKG.   No significant change from EKG 11/19/2017: Sinus tachycardia rate of 100 bpm. ° °Assessment  ° °1. Aortic ectasia, abdominal (HCC)   °2. Primary hypertension   °3. Hypercholesteremia   °4. Tobacco abuse disorder   °5. Claudication in peripheral vascular disease (HCC)   °6. Centrilobular emphysema (HCC)   ° °Recommendations:  ° °Mrs. Michaela Vandermeer is an African-American female referred to me by Dr. Murli Ramaswamy for evaluation of coronary calcification noted on theCT scan performed on 09/18/2017.  She has had a negative nuclear stress in December 2019.   ° °Past medical history significant for centrilobular emphysema, hypertension and hyperlipidemia and tobacco use disorder.  She is still smoking about 2 to 3 cigarettes a day and complete abstinence discussed.  Her main complaint today is right leg cramps worse on the left with activity and also at rest.  Her vascular examination is mildly abnormal.  I will obtain lower extremity arterial duplex. ° °With regard to abdominal aortic ectasia, continue primary prevention is indicated.  Could consider repeating abdominal aortic duplex in 10 years.  Continue statins and aspirin 81 mg daily. ° °With regard to hypertension, blood pressure is well controlled.  Continue low-dose bisoprolol. ° °She would like to establish with primary care physician, I will make a referral to see Dr. Robyn Sanders.  When she establishes with her, she can see me back on a as needed basis.  She is aware to contact me if she needs medication refills for now or if she needs to come back to follow-up with me.  Obviously if lower extremity arterial duplex is markedly abnormal, I will see her back after the test. ° ° ° , MD, FACC °08/12/2019, 11:00 AM °Office:  [phone]. °

## 2019-08-15 DIAGNOSIS — J449 Chronic obstructive pulmonary disease, unspecified: Secondary | ICD-10-CM | POA: Diagnosis not present

## 2019-08-31 ENCOUNTER — Other Ambulatory Visit: Payer: Self-pay

## 2019-08-31 ENCOUNTER — Ambulatory Visit: Payer: Medicare HMO

## 2019-08-31 ENCOUNTER — Other Ambulatory Visit: Payer: Medicare HMO

## 2019-08-31 DIAGNOSIS — I739 Peripheral vascular disease, unspecified: Secondary | ICD-10-CM

## 2019-09-15 DIAGNOSIS — J449 Chronic obstructive pulmonary disease, unspecified: Secondary | ICD-10-CM | POA: Diagnosis not present

## 2019-09-16 NOTE — Progress Notes (Signed)
Called patient, NA, LMAM. Please contact patient to schedule appt, he will discuss results at visit.

## 2019-09-26 ENCOUNTER — Telehealth: Payer: Self-pay | Admitting: Pulmonary Disease

## 2019-09-26 MED ORDER — PREDNISONE 10 MG PO TABS: ORAL_TABLET | ORAL | 0 refills | Status: DC

## 2019-09-26 MED ORDER — DOXYCYCLINE HYCLATE 100 MG PO TABS: 100.0000 mg | ORAL_TABLET | Freq: Two times a day (BID) | ORAL | 0 refills | Status: DC

## 2019-09-26 NOTE — Telephone Encounter (Signed)
Definitely get covid tested asap   Also  Please take prednisone 40 mg x1 day, then 30 mg x1 day, then 20 mg x1 day, then 10 mg x1 day, and then 5 mg x1 day and stop  Take doxycycline 100mg  po twice daily x 5 days; take after meals and avoid sunlight   Allergies  Allergen Reactions  . Levofloxacin Nausea And Vomiting  . Tramadol Nausea And Vomiting  . Adhesive [Tape] Rash and Other (See Comments)    WELTS, also  . Latex Rash and Other (See Comments)    WELTS, also

## 2019-09-26 NOTE — Telephone Encounter (Signed)
Called and spoke with pt letting her know that MR said she needed to be tested for covid. Stated to her that she could go to [facility] to have the covid test done or she could go on [place]/covid19 testing site. Pt verbalized understanding. Also stated to pt that we were going to send Rx for prednisone and doxy abx to pharmacy for her but she needed to be tested for covid to rule that out. Pt verbalized understanding. Nothing further needed.

## 2019-09-26 NOTE — Telephone Encounter (Signed)
Primary Pulmonologist: Ramaswamy Last office visit and with whom: 03/25/19 with [name] What do we see them for (pulmonary problems): emphysema Last OV assessment/plan:  Assessment & Plan Note by Lauraine Rinne, NP at 03/25/2019 1:22 PM Author: Lauraine Rinne, NP Author Type: Nurse Practitioner Filed: 03/25/2019 1:22 PM  Note Status: Written Cosign: Cosign Not Required Encounter Date: 03/25/2019  Problem: Tobacco abuse disorder  Editor: Lauraine Rinne, NP (Nurse Practitioner)                 Plan: Please try to be down to 0 cigarettes at next office visit Follow-up in 4 months Offered nicotine replacement therapies or Wellbutrin prescription, patient declined    Assessment & Plan Note by Lauraine Rinne, NP at 03/25/2019 1:20 PM Author: Lauraine Rinne, NP Author Type: Nurse Practitioner Filed: 03/25/2019 1:22 PM  Note Status: Written Cosign: Cosign Not Required Encounter Date: 03/25/2019  Problem: Centrilobular emphysema ([place])  Editor: Lauraine Rinne, NP (Nurse Practitioner)                 Assessment: August/2019 pulmonary function test shows DLCO 54 08/2017 CT chest shows diffuse bronchial wall thickening centrilobular and paraseptal emphysema, few scattered areas of bronchiectasis Maintained well on Trelegy Ellipta Still smoking 2 cigarette  Plan: Continue Trelegy Ellipta Rescue inhaler as needed Emphasized again the need to stop smoking, please try to be at 0 cigarettes a day by next office visit Follow-up in 4 months     Was appointment offered to patient (explain)?  Pt wants meds called in   Reason for call: called and spoke with pt who stated she started coughing up green phlegm 4 days ago. Pt stated she had been out of town for a funeral 3 days ago and just returned home last night 8/29. Pt stated that the funeral was in [place] in [place], [place]. Pt states that she also has some mild wheeze. Pt stated that she did wear a mask while she was at the funeral.   Pt denies any complaints  of fever and states that her temp today was 98.4.  Pt has had to use her albuterol inhaler at least once recently since symptoms began. Pt has been using her Trelegy inhaler. Pt has not had to use her nebulizer.  Asked pt if she has received her covid vaccine and she stated that she has not. Asked pt if she has been around anyone that has had covid and she stated not that she knows of. Pt stated at the funeral everyone had to wear a mask and they also had to keep their distance.  Pt wants to know recommendations to help with her symptoms. MR, please advise.  Allergies  Allergen Reactions  . Levofloxacin Nausea And Vomiting  . Tramadol Nausea And Vomiting  . Adhesive [Tape] Rash and Other (See Comments)    WELTS, also  . Latex Rash and Other (See Comments)    WELTS, also    Immunization History  Administered Date(s) Administered  . Pneumococcal Conjugate-13 04/03/2017  . Pneumococcal Polysaccharide-23 11/15/2018

## 2019-09-27 ENCOUNTER — Other Ambulatory Visit: Payer: Self-pay

## 2019-09-27 ENCOUNTER — Other Ambulatory Visit: Payer: Medicare HMO

## 2019-09-27 DIAGNOSIS — Z20822 Contact with and (suspected) exposure to covid-19: Secondary | ICD-10-CM

## 2019-09-29 LAB — NOVEL CORONAVIRUS, NAA: SARS-CoV-2, NAA: NOT DETECTED

## 2019-10-04 NOTE — Progress Notes (Signed)
From front desk, just FYI.

## 2019-10-12 ENCOUNTER — Ambulatory Visit (INDEPENDENT_AMBULATORY_CARE_PROVIDER_SITE_OTHER): Payer: Medicare HMO | Admitting: Internal Medicine

## 2019-10-12 ENCOUNTER — Encounter: Payer: Self-pay | Admitting: Internal Medicine

## 2019-10-12 ENCOUNTER — Other Ambulatory Visit: Payer: Self-pay

## 2019-10-12 VITALS — BP 100/66 | HR 68 | Temp 99.2°F | Ht 65.0 in | Wt 189.6 lb

## 2019-10-12 DIAGNOSIS — Z87891 Personal history of nicotine dependence: Secondary | ICD-10-CM

## 2019-10-12 DIAGNOSIS — R918 Other nonspecific abnormal finding of lung field: Secondary | ICD-10-CM | POA: Diagnosis not present

## 2019-10-12 DIAGNOSIS — Z7189 Other specified counseling: Secondary | ICD-10-CM

## 2019-10-12 DIAGNOSIS — Z7185 Encounter for immunization safety counseling: Secondary | ICD-10-CM

## 2019-10-12 DIAGNOSIS — J441 Chronic obstructive pulmonary disease with (acute) exacerbation: Secondary | ICD-10-CM

## 2019-10-12 DIAGNOSIS — J449 Chronic obstructive pulmonary disease, unspecified: Secondary | ICD-10-CM

## 2019-10-12 MED ORDER — PREDNISONE 10 MG PO TABS: ORAL_TABLET | ORAL | 0 refills | Status: AC

## 2019-10-12 MED ORDER — CEPHALEXIN 500 MG PO TABS: 500.0000 mg | ORAL_TABLET | Freq: Three times a day (TID) | ORAL | 0 refills | Status: DC

## 2019-10-12 NOTE — Addendum Note (Signed)
Addended by: Vanessa Barbara on: 10/12/2019 02:19 PM   Modules accepted: Orders

## 2019-10-12 NOTE — Addendum Note (Signed)
Addended by: Vanessa Barbara on: 10/12/2019 12:04 PM   Modules accepted: Orders

## 2019-10-12 NOTE — Progress Notes (Signed)
OV 09/02/2017  Chief Complaint  Patient presents with  . Follow-up    PFT performed 08/31/17.  Pt states she has been doing well since last visit. States she still becomes SOB with exertion.     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 She continues to do well. No interim complaints or exacerbations. COPD cat score is 33 and she is significant amount of symptoms. Although she does feel stable and better. A CT scan of the chest was in 2017 without any cancer. She continues to smoke unable to quit. There are no new issues. She says she cannot do pulmonary rehabilitation because she has to visit her husband was in a nursing home of bilateral strokes. She is happy with her triple inhaler therapy. But on deeper questioning she does admit out of proportion shortness of breath. In fact rated level V out of 5.    OV 10/12/2019   Subjective:  Patient ID: Rachel Wang, female , DOB: 02-23-1950, age 69 y.o. years. , MRN: [id number],  ADDRESS: [address] [place] [address] PCP  Patient, No Pcp Per Providers : Treatment Team:  Attending Provider: Brand Males, MD   Chief Complaint  Patient presents with  . Follow-up    green phlem in throat, runs course for a week.  Not this anymore, more clear than green.       HPI Rachel Wang 69 y.o. -     CAT COPD Symptom & Quality of Life Score ([place] trademark) 0 is no burden. 5 is highest burden 09/02/2017   Never Cough -> Cough all the time 4  No phlegm in chest -> Chest is full of phlegm 4  No chest tightness -> Chest feels very tight 3  No dyspnea for 1 flight stairs/hill -> Very dyspneic for 1 flight of stairs 5  No limitations for ADL at home -> Very limited with ADL at home 4  Confident leaving home -> Not at all confident leaving home 4  Sleep soundly -> Do not sleep soundly because of lung condition 5  Lots of Energy -> No energy at all 4  TOTAL Score (max 40)  33          Results for Rachel, Wang (MRN [id number]) as of 09/02/2017 11:43  Ref. Range 12/28/2015 13:45 08/31/2017 14:23  FEV1-Post Latest Units: L 1.30 1.31  FEV1-%Pred-Post Latest Units: % 63 65   Results for Rachel, Wang (MRN [id number]) as of 09/02/2017 11:43  Ref. Range 12/28/2015 13:45 08/31/2017 14:23  DLCO unc Latest Units: ml/min/mmHg 9.64 13.80  DLCO unc % pred Latest Units: % 37 53       has a past medical history of Bronchitis, COPD (chronic obstructive pulmonary disease) ([place]), Emphysema of lung ([place]), Hyperlipidemia, and Stroke ([place]).   reports that she has been smoking cigarettes. She started smoking about 52 years ago. She has a 25.50 pack-year smoking history. She has never used smokeless tobacco.  No past surgical history on file.  Allergies  Allergen Reactions  . Levofloxacin Nausea And Vomiting  . Tramadol Nausea And Vomiting  . Adhesive [Tape] Rash and Other (See Comments)    WELTS, also  . Latex Rash and Other (See Comments)    WELTS, also    Immunization History  Administered Date(s) Administered  . Pneumococcal Conjugate-13 04/03/2017  . Pneumococcal Polysaccharide-23 11/15/2018    Family History  Problem Relation Age of Onset  . HIV Father   .  Lung cancer Sister   . Lung cancer Paternal Aunt   . Stroke Maternal Grandmother   . Heart attack Maternal Grandfather   . Stroke Paternal Grandmother   . Hypertension Paternal Grandmother   . Heart attack Paternal Grandfather      Current Outpatient Medications:  .  albuterol (PROVENTIL HFA;VENTOLIN HFA) 108 (90 Base) MCG/ACT inhaler, Inhale 2 puffs into the lungs every 4 (four) hours as needed for wheezing or shortness of breath., Disp: 1 Inhaler, Rfl: 0 .  aspirin 81 MG chewable tablet, Chew 81 mg by mouth daily., Disp: , Rfl:  .  bisoprolol (ZEBETA) 5 MG tablet, Take 0.5 tablets (2.5 mg total) by mouth daily., Disp: 90 tablet, Rfl: 1 .  calcium carbonate (TUMS - DOSED IN MG ELEMENTAL CALCIUM)  500 MG chewable tablet, Chew 1-2 tablets by mouth as needed for indigestion or heartburn., Disp: , Rfl:  .  doxycycline (VIBRA-TABS) 100 MG tablet, Take 1 tablet (100 mg total) by mouth 2 (two) times daily., Disp: 10 tablet, Rfl: 0 .  Fluticasone-Umeclidin-Vilant (TRELEGY ELLIPTA) 100-62.5-25 MCG/INH AEPB, Inhale 1 puff into the lungs daily., Disp: 2 each, Rfl: 0 .  Multiple Vitamins-Minerals (MULTIVITAMIN ADULTS 50+ PO), Take 1 tablet by mouth daily. , Disp: , Rfl:  .  predniSONE (DELTASONE) 10 MG tablet, Take 4x1day, 3x1day, 2x1day, 1x1day, 0.5x1day then stop, Disp: 11 tablet, Rfl: 0 .  simvastatin (ZOCOR) 40 MG tablet, Take 1 tablet (40 mg total) by mouth daily. Must keep scheduled appt for future refills (Patient taking differently: Take 40 mg by mouth daily at 6 PM. ), Disp: 90 tablet, Rfl: 1 .  albuterol (PROVENTIL) (2.5 MG/3ML) 0.083% nebulizer solution, Take 3 mLs (2.5 mg total) by nebulization every 6 (six) hours as needed for wheezing or shortness of breath., Disp: 75 mL, Rfl: 0      Objective:   Vitals:   10/12/19 1124  BP: 100/66  Pulse: 68  Temp: 99.2 F (37.3 C)  TempSrc: Temporal  SpO2: 99%  Weight: 189 lb 9.6 oz (86 kg)  Height: 5\' 5"  (1.651 m)    Estimated body mass index is 31.55 kg/m as calculated from the following:   Height as of this encounter: 5\' 5"  (1.651 m).   Weight as of this encounter: 189 lb 9.6 oz (86 kg).  @WEIGHTCHANGE @  [employer]   10/12/19 1124  Weight: 189 lb 9.6 oz (86 kg)     Physical Exam Neuro: Alert and Oriented x 3. GCS 15. Speech normal Psych: Pleasant Resp: Clear to ausucultation bilaterally. No wheeze No crackles HEENT: Normal upper airway. PEERL +. No post nasal drip CVS: Normal heart sounds        Assessment:       ICD-10-CM   1. COPD, moderate ([place])  J44.9   2. COPD exacerbation ([place])  J44.1   3. Vaccine counseling  Z71.89   4. Multiple lung nodules on CT  R91.8   5. History of smoking 25-50 pack years  Z87.891         Plan:     Patient Instructions  COPD, moderate ([facility]) COPD exacerbation -Resolving but persistent exacerbation  Plan -Continue Trelegy scheduled -Continue albuterol as needed -Repeat COPD exacerbation treatment based on her request  cephalexin 500mg  three times daily x  5 days  - Please take prednisone 40 mg x1 day, then 30 mg x1 day, then 20 mg x1 day, then 10 mg x1 day, and then 5 mg x1 day and stop   Vaccine  counseling  -Respect reluctance against vaccines after having a bad setback with the flu shot in the past.  Based on the history it appears that he got a coincidental flu..  After getting the flu shot because the flu shot normally takes a few weeks to kick in.  All vaccines take a few weeks to kick in.  It does not appear that you had an actual allergy to an injection or a vaccine  Plan -Recommend flu shot, recommend Covid vaccine, recommend shingles vaccine  Multiple lung nodules on CT 5 mm noted in 2019 History of smoking 25-50 pack years  -Do CT scan of the chest without contrast anytime in the next 6 months  - Quit smoking  Follow-up -6 months but after CT scan of the chest     SIGNATURE    Dr. Brand Males, M.D., F.C.C.P,  Pulmonary and Critical Care Medicine Staff Physician, [place] Director - Interstitial Lung Disease  Program  [facility] [place], [place], [address]  Pager: [phone], If no answer or between  15:00h - 7:00h: call [phone] Telephone: [phone]  11:45 AM 10/12/2019

## 2019-10-12 NOTE — Patient Instructions (Signed)
COPD, moderate ([place]) COPD exacerbation -Resolving but persistent exacerbation  Plan -Continue Trelegy scheduled -Continue albuterol as needed -Repeat COPD exacerbation treatment based on her request  cephalexin 500mg  three times daily x  5 days  - Please take prednisone 40 mg x1 day, then 30 mg x1 day, then 20 mg x1 day, then 10 mg x1 day, and then 5 mg x1 day and stop   Vaccine counseling  -Respect reluctance against vaccines after having a bad setback with the flu shot in the past.  Based on the history it appears that he got a coincidental flu..  After getting the flu shot because the flu shot normally takes a few weeks to kick in.  All vaccines take a few weeks to kick in.  It does not appear that you had an actual allergy to an injection or a vaccine  Plan -Recommend flu shot, recommend Covid vaccine, recommend shingles vaccine  Multiple lung nodules on CT 5 mm noted in 2019 History of smoking 25-50 pack years  -Do CT scan of the chest without contrast anytime in the next 6 months  - Quit smoking  Follow-up -6 months but after CT scan of the chest

## 2019-10-13 ENCOUNTER — Telehealth: Payer: Self-pay | Admitting: Internal Medicine

## 2019-10-13 MED ORDER — TRELEGY ELLIPTA 100-62.5-25 MCG/INH IN AEPB: 1.0000 | INHALATION_SPRAY | Freq: Every day | RESPIRATORY_TRACT | 0 refills | Status: DC

## 2019-10-13 NOTE — Addendum Note (Signed)
Addended by: Vanessa Barbara on: 10/13/2019 08:41 AM   Modules accepted: Orders

## 2019-10-13 NOTE — Telephone Encounter (Signed)
Called and spoke with patient, let her know that we received more Trelegy samples yesterday and I put one at the front for her and she could come and pick it up at her convince.  She verbalized understanding.  Nothing further needed.

## 2019-10-16 DIAGNOSIS — J449 Chronic obstructive pulmonary disease, unspecified: Secondary | ICD-10-CM | POA: Diagnosis not present

## 2019-10-21 ENCOUNTER — Ambulatory Visit: Payer: Medicare HMO | Admitting: Cardiology

## 2019-10-31 ENCOUNTER — Encounter: Payer: Self-pay | Admitting: Cardiology

## 2019-10-31 ENCOUNTER — Ambulatory Visit: Payer: Medicare HMO | Admitting: Cardiology

## 2019-10-31 ENCOUNTER — Other Ambulatory Visit: Payer: Self-pay

## 2019-10-31 VITALS — BP 122/76 | HR 92 | Resp 15 | Ht 65.0 in | Wt 191.0 lb

## 2019-10-31 DIAGNOSIS — I10 Essential (primary) hypertension: Secondary | ICD-10-CM | POA: Diagnosis not present

## 2019-10-31 DIAGNOSIS — R252 Cramp and spasm: Secondary | ICD-10-CM

## 2019-10-31 DIAGNOSIS — I77811 Abdominal aortic ectasia: Secondary | ICD-10-CM

## 2019-10-31 NOTE — Progress Notes (Signed)
Primary Physician/Referring:  Patient, No Pcp Per  Patient ID: Rachel Wang, female    DOB: 11/29/1950, 69 y.o.   MRN: [id number]  Chief Complaint  Patient presents with  . Follow-up    2 month  . Echo Results    HPI: Rachel Wang  is a 69 y.o. female African-American female with coronary calcification noted on the CT scan performed on 09/18/2017.  She has had a negative nuclear stress in December 2019.  Past medical history significant for centrilobular emphysema, hypertension and hyperlipidemia and tobacco use disorder.  She is still smoking about 2 to 3 cigarettes a day  Her main complaint today is right leg cramps. This has worsened since last year.  She underwent Lower Extremity Arterial Duplex and presents for f/u. No  Change in symptoms.   Past Medical History:  Diagnosis Date  . Bronchitis   . COPD (chronic obstructive pulmonary disease) ([place])   . Emphysema of lung ([place])   . Hyperlipidemia   . Stroke [facility])    History reviewed. No pertinent surgical history.  Social History   Tobacco Use  . Smoking status: Current Some Day Smoker    Packs/day: 0.50    Years: 51.00    Pack years: 25.50    Types: Cigarettes    Start date: 04/08/1967  . Smokeless tobacco: Never Used  . Tobacco comment: 3 cigarettes per day  Substance Use Topics  . Alcohol use: No   Marital Status: Widowed   Review of Systems  Cardiovascular: Positive for claudication (right leg calf worse) and dyspnea on exertion (stable since being on Trilogy). Negative for chest pain and leg swelling.  Musculoskeletal: Positive for muscle cramps (legs occasionally).  Gastrointestinal: Negative for melena.    Objective  Blood pressure 122/76, pulse 92, resp. rate 15, height 5' 5"  (1.651 m), weight 191 lb (86.6 kg), SpO2 95 %. Body mass index is 31.78 kg/m.  Vitals with BMI 10/31/2019 10/12/2019 08/12/2019  Height 5' 5"  5' 5"  5' 5"   Weight 191 lbs 189 lbs 10 oz 192 lbs  BMI 31.78 29.51 88.41  Systolic  660 630 160  Diastolic 76 66 69  Pulse 92 68 96      Physical Exam Constitutional:      General: She is not in acute distress.    Appearance: She is well-developed.  Neck:     Thyroid: No thyromegaly.     Vascular: No JVD.  Cardiovascular:     Rate and Rhythm: Normal rate and regular rhythm.     Pulses:          Carotid pulses are 2+ on the right side and 2+ on the left side.      Femoral pulses are 2+ on the right side and 2+ on the left side.      Popliteal pulses are 1+ on the right side and 1+ on the left side.       Dorsalis pedis pulses are 2+ on the right side and 2+ on the left side.       Posterior tibial pulses are 0 on the right side and 0 on the left side.     Heart sounds: Normal heart sounds. No murmur heard.  No gallop.      Comments: Chronically ischemic skin changes both lower extremities. Feeble femoral pulse and pedal pulse bilateral. No carotid bruit.  No edema, no JVD Pulmonary:     Effort: Pulmonary effort is normal.     Breath sounds: Rales (bilateral  scattered) present.  Abdominal:     General: Bowel sounds are normal.     Palpations: Abdomen is soft.  Neurological:     Mental Status: She is alert.    Radiology: No results found.  Laboratory examination:   01/04/2018: Cholesterol 132, triglycerides 73, HDL 47, LDL 70.  Creatinine 0.74, EGFR 84/97, potassium 3.9, CMP normal.  CMP Latest Ref Rng & Units 07/19/2017 07/18/2017 04/03/2017  Glucose 65 - 99 mg/dL 158(H) 144(H) 89  BUN 6 - 20 mg/dL 6 <3(L) 8  Creatinine 0.44 - 1.00 mg/dL 0.76 0.70 0.80  Sodium 135 - 145 mmol/L 141 143 139  Potassium 3.5 - 5.1 mmol/L 3.4(L) 3.4(L) 3.4(L)  Chloride 101 - 111 mmol/L 106 102 100  CO2 22 - 32 mmol/L 25 - 33(H)  Calcium 8.9 - 10.3 mg/dL 9.4 - 9.8  Total Protein 6.0 - 8.3 g/dL - - 7.6  Total Bilirubin 0.2 - 1.2 mg/dL - - 0.2  Alkaline Phos 39 - 117 U/L - - 82  AST 0 - 37 U/L - - 12  ALT 0 - 35 U/L - - 12   CBC Latest Ref Rng & Units 07/19/2017 07/18/2017  07/18/2017  WBC 4.0 - 10.5 K/uL 9.2 - 7.5  Hemoglobin 12.0 - 15.0 g/dL 13.0 15.0 13.8  Hematocrit 36 - 46 % 40.8 44.0 44.2  Platelets 150 - 400 K/uL 280 - 293   Medications   Medications Discontinued During This Encounter  Medication Reason  . Fluticasone-Umeclidin-Vilant (TRELEGY ELLIPTA) 100-62.5-25 MCG/INH AEPB Error  . Cephalexin 500 MG tablet No longer needed (for PRN medications)  . doxycycline (VIBRA-TABS) 100 MG tablet No longer needed (for PRN medications)  . simvastatin (ZOCOR) 40 MG tablet Change in therapy   Current Meds  Medication Sig  . albuterol (PROVENTIL HFA;VENTOLIN HFA) 108 (90 Base) MCG/ACT inhaler Inhale 2 puffs into the lungs every 4 (four) hours as needed for wheezing or shortness of breath.  Marland Kitchen albuterol (PROVENTIL) (2.5 MG/3ML) 0.083% nebulizer solution Take 3 mLs (2.5 mg total) by nebulization every 6 (six) hours as needed for wheezing or shortness of breath.  Marland Kitchen aspirin 81 MG chewable tablet Chew 81 mg by mouth daily.  Marland Kitchen atorvastatin (LIPITOR) 40 MG tablet Take 40 mg by mouth daily.  . bisoprolol (ZEBETA) 5 MG tablet Take 0.5 tablets (2.5 mg total) by mouth daily.  . calcium carbonate (TUMS - DOSED IN MG ELEMENTAL CALCIUM) 500 MG chewable tablet Chew 1-2 tablets by mouth as needed for indigestion or heartburn.  . Fluticasone-Umeclidin-Vilant (TRELEGY ELLIPTA) 100-62.5-25 MCG/INH AEPB Inhale 1 puff into the lungs daily.  . Multiple Vitamins-Minerals (MULTIVITAMIN ADULTS 50+ PO) Take 1 tablet by mouth daily.     Cardiac Studies:   Echocardiogram 12/11/2017: Left ventricle cavity is normal in size. Normal left ventricular shape. Normal global wall motion. Normal diastolic filling pattern. Calculated EF 56%. No hemodynamically significant valvular abnormality.  Normal right atrial pressure.  CT scan of the chest 09/18/2017, comparison 11/03/2015.  Aortic atherosclerosis, atherosclerosis of the great vessels of the mediastinum and three-vessel coronary artery  calcification including left main. Bronchiectasis.  Centrilobular and paraseptal emphysema.  Multiple tiny pulmonary nodules.  Exercise myoview stress 01/04/2018:  1. The patient performed treadmill exercise using Bruce protocol, completing 5:33 minutes. The patient completed an estimated workload of 7 METS, reaching 86% of the maximum predicted heart rate. Normal hemodynamic response was seen. Stress symptoms included chest tightness. Exercise capacity was low. No ischemic changes seen on stress electrocardiogram.  2. The  overall quality of the study is excellent. There is no evidence of abnormal lung activity. Stress and rest SPECT images demonstrate homogeneous tracer distribution throughout the myocardium. Gated SPECT imaging reveals normal myocardial thickening and wall motion. The left ventricular ejection fraction was normal (60%).   3. Low risk study.  Abdominal Aortic Duplex 12/29/2018:  The maximum aorta (sac) diameter is 2.43 cm (prox). Mild ectasia noted in  the proximal abdominal aorta.  Mild diffuse plaque observed in the proximal, mid and distal aorta. Normal  flow velocities noted in the aorta and iliac vessels.  Consider rescreening in 10 years for AAA. Previously reported AAA on  12/14/17 at 3 cm, appears to be an overestimation. Overall stable scan.   Lower Extremity Arterial Duplex 08/31/2019: Right profunda femoral artery >50% stenosis.  Otherwise no hemodynamically significant stenoses are identified in the  lower extremity arterial system. There is a heavily calcified plaque in the bilateral CFA and external iliac artery right more than left with <50% stenosis.  This exam reveals normal perfusion of the right lower extremity (ABI 1.00) and mildly decreased perfusion of the left lower extremity, noted at the anterior tibial and post tibial artery level (ABI 0.86) with multiphasic waveforms at ankle. Overall no significant change from  12/11/2017.   EKG   EKG 08/12/2019:  Normal sinus rhythm at rate of 94 bpm, normal axis.  No evidence of ischemia, normal EKG.   No significant change from EKG 11/19/2017: Sinus tachycardia rate of 100 bpm.  Assessment   1. Primary hypertension   2. Leg cramps   3. Abdominal aortic ectasia [facility])    Recommendations:   Mrs. Shenea Giacobbe is a 69 y.o. African-American female with coronary calcification noted on the CT scan performed on 09/18/2017.  She has had a negative nuclear stress in December 2019.  Past medical history significant for centrilobular emphysema, hypertension and hyperlipidemia and tobacco use disorder.  She is still smoking about 2 to 3 cigarettes a day. Her main complaint today is right leg cramps.    I have reviewed the results of the lower extremity arterial duplex, do not see any significant abnormality.  Do not think her leg pain and cramps are related to Significant PAD.  She does have abdominal aortic ectasia, coronary calcification, hence needs continued primary prevention with LDL goal <70, continued aspirin indefinitely and smoking cessation.  Although she is presently smoking only 2 to 3 cigarettes a day, complete abstinence again discussed with the patient.As needed basis.     Adrian Prows, MD, [facility] 10/31/2019, 2:07 PM Office: [phone].

## 2019-11-15 DIAGNOSIS — J449 Chronic obstructive pulmonary disease, unspecified: Secondary | ICD-10-CM | POA: Diagnosis not present

## 2019-12-01 ENCOUNTER — Telehealth: Payer: Self-pay | Admitting: Internal Medicine

## 2019-12-01 ENCOUNTER — Other Ambulatory Visit: Payer: Self-pay

## 2019-12-01 DIAGNOSIS — I10 Essential (primary) hypertension: Secondary | ICD-10-CM

## 2019-12-01 MED ORDER — TRELEGY ELLIPTA 100-62.5-25 MCG/INH IN AEPB: 1.0000 | INHALATION_SPRAY | Freq: Every day | RESPIRATORY_TRACT | 0 refills | Status: DC

## 2019-12-01 MED ORDER — ATORVASTATIN CALCIUM 40 MG PO TABS
40.0000 mg | ORAL_TABLET | Freq: Every day | ORAL | 1 refills | Status: DC
Start: 2019-12-01 — End: 2020-06-14

## 2019-12-01 MED ORDER — BISOPROLOL FUMARATE 5 MG PO TABS: 2.5000 mg | ORAL_TABLET | Freq: Every day | ORAL | 1 refills | Status: AC

## 2019-12-01 MED ORDER — ATORVASTATIN CALCIUM 40 MG PO TABS: 40.0000 mg | ORAL_TABLET | Freq: Every day | ORAL | 1 refills | Status: DC

## 2019-12-01 MED ORDER — BISOPROLOL FUMARATE 5 MG PO TABS: 2.5000 mg | ORAL_TABLET | Freq: Every day | ORAL | 1 refills | Status: DC

## 2019-12-02 MED ORDER — TRELEGY ELLIPTA 100-62.5-25 MCG/INH IN AEPB: 1.0000 | INHALATION_SPRAY | Freq: Every day | RESPIRATORY_TRACT | 0 refills | Status: DC

## 2019-12-02 NOTE — Telephone Encounter (Signed)
Spoke with the pt  She is in the donut hole  Needing samples of trelegy  2 samples placed up front for pick up

## 2019-12-02 NOTE — Telephone Encounter (Signed)
LMTCB for the pt 

## 2019-12-02 NOTE — Telephone Encounter (Signed)
Lm for patient.  

## 2019-12-02 NOTE — Telephone Encounter (Signed)
Pt returning a phone call. Pt can be reached at [phone]

## 2019-12-16 DIAGNOSIS — J449 Chronic obstructive pulmonary disease, unspecified: Secondary | ICD-10-CM | POA: Diagnosis not present

## 2020-01-08 ENCOUNTER — Encounter (HOSPITAL_COMMUNITY): Payer: Self-pay | Admitting: *Deleted

## 2020-01-08 ENCOUNTER — Ambulatory Visit (HOSPITAL_COMMUNITY)
Admission: EM | Admit: 2020-01-08 | Discharge: 2020-01-08 | Disposition: A | Discharge status: Home / Self Care | Payer: Medicare HMO | Attending: Internal Medicine | Admitting: Internal Medicine

## 2020-01-08 ENCOUNTER — Other Ambulatory Visit: Payer: Self-pay

## 2020-01-08 DIAGNOSIS — L02412 Cutaneous abscess of left axilla: Secondary | ICD-10-CM

## 2020-01-08 MED ORDER — LIDOCAINE HCL 2 % IJ SOLN
INTRAMUSCULAR | Status: AC
  Filled 2020-01-08: qty 20

## 2020-01-08 NOTE — Discharge Instructions (Signed)
Tried Aleve the gauze in for 2 days and then pull it out yourself.  It is okay if it falls out prior to this.  Take Tylenol or Motrin for pain.  Please return for any worsening pain, redness, fever or chills

## 2020-01-08 NOTE — ED Triage Notes (Signed)
C/O left axillary abscess x approx 2 wks with progressive worsening.  Denies fevers.  Has been applying warm compresses and alcohol without relief.

## 2020-01-08 NOTE — ED Provider Notes (Signed)
[place]    CSN: [id number] Arrival date & time: 01/08/20  1306      History   Chief Complaint Chief Complaint  Patient presents with  . Abscess    HPI Rachel Wang is a 69 y.o. female with past medical history of COPD, hyperlipidemia, CVA presents to urgent care today with pain and swelling under left armpit.  Patient reports abscess under left arm x2 weeks.  Attempted to relieve swelling and pain with hot compresses and alcohol wipes without success.  Patient reports history of same.  No recent fever or chills.  Past Medical History:  Diagnosis Date  . Bronchitis   . COPD (chronic obstructive pulmonary disease) ([place])   . Emphysema of lung ([place])   . Hyperlipidemia   . Stroke [facility])     Patient Active Problem List   Diagnosis Date Noted  . Healthcare maintenance 11/15/2018  . Centrilobular emphysema ([place]) 12/10/2017  . Tobacco abuse disorder 12/10/2017  . Chest pain 07/18/2017  . COPD with acute exacerbation ([place]) 07/18/2017  . Gastroesophageal reflux disease 04/03/2017  . Routine general medical examination at a health care facility 04/03/2017  . Encounter for preventive care 04/03/2017  . Cervical radiculopathy 02/04/2017  . COPD, moderate ([place]) 05/12/2016  . Hyperlipidemia 01/25/2016  . History of stroke 01/25/2016  . COPD exacerbation ([place]) 11/09/2015  . Current every day smoker 11/09/2015  . History of pneumonia 11/09/2015    Past Surgical History:  Procedure Laterality Date  . TUBAL LIGATION      OB History   No obstetric history on file.      Home Medications    Prior to Admission medications   Medication Sig Start Date End Date Taking? Authorizing Provider  aspirin 81 MG chewable tablet Chew 81 mg by mouth daily.   Yes [provider]  atorvastatin (LIPITOR) 40 MG tablet Take 1 tablet (40 mg total) by mouth daily. 12/01/19  Yes Adrian Prows, MD  bisoprolol (ZEBETA) 5 MG tablet Take 0.5 tablets (2.5 mg total) by mouth  daily. 12/01/19  Yes Adrian Prows, MD  calcium carbonate (TUMS - DOSED IN MG ELEMENTAL CALCIUM) 500 MG chewable tablet Chew 1-2 tablets by mouth as needed for indigestion or heartburn.   Yes [provider]  Fluticasone-Umeclidin-Vilant (TRELEGY ELLIPTA) 100-62.5-25 MCG/INH AEPB Inhale 1 puff into the lungs daily. 12/01/19  Yes Adrian Prows, MD  Multiple Vitamins-Minerals (MULTIVITAMIN ADULTS 50+ PO) Take 1 tablet by mouth daily.    Yes [provider]  albuterol (PROVENTIL HFA;VENTOLIN HFA) 108 (90 Base) MCG/ACT inhaler Inhale 2 puffs into the lungs every 4 (four) hours as needed for wheezing or shortness of breath. 07/19/17   Elodia Florence., MD  albuterol (PROVENTIL) (2.5 MG/3ML) 0.083% nebulizer solution Take 3 mLs (2.5 mg total) by nebulization every 6 (six) hours as needed for wheezing or shortness of breath. 07/19/17 10/31/19  Elodia Florence., MD  Fluticasone-Umeclidin-Vilant (TRELEGY ELLIPTA) 100-62.5-25 MCG/INH AEPB Inhale 1 puff into the lungs daily. 12/02/19   Brand Males, MD  predniSONE (DELTASONE) 10 MG tablet Take 4x1day, 3x1day, 2x1day, 1x1day, 0.5x1day then stop Patient not taking: No sig reported 09/26/19   Brand Males, MD    Family History Family History  Problem Relation Age of Onset  . HIV Father   . Lung cancer Sister   . Lung cancer Paternal Aunt   . Stroke Maternal Grandmother   . Heart attack Maternal Grandfather   . Stroke Paternal Grandmother   . Hypertension  Paternal Grandmother   . Heart attack Paternal Grandfather     Social History Social History   Tobacco Use  . Smoking status: Current Every Day Smoker    Packs/day: 0.50    Years: 51.00    Pack years: 25.50    Types: Cigarettes    Start date: 04/08/1967  . Smokeless tobacco: Never Used  . Tobacco comment: 3 cigarettes per day  Vaping Use  . Vaping Use: Never used  Substance Use Topics  . Alcohol use: No  . Drug use: No     Allergies   Levofloxacin, Tramadol,  Adhesive [tape], and Latex   Review of Systems As stated in HPI otherwise negative   Physical Exam Triage Vital Signs ED Triage Vitals  Enc Vitals Group     BP 01/08/20 1321 133/84     Pulse Rate 01/08/20 1321 96     Resp 01/08/20 1321 (!) 22     Temp 01/08/20 1321 98.5 F (36.9 C)     Temp Source 01/08/20 1321 Oral     SpO2 01/08/20 1321 96 %     Weight --      Height --      Head Circumference --      Peak Flow --      Pain Score 01/08/20 1322 10     Pain Loc --      Pain Edu? --      Excl. in [place]? --    No data found.  Updated Vital Signs BP 133/84   Pulse 96   Temp 98.5 F (36.9 C) (Oral)   Resp (!) 22   SpO2 96%      Physical Exam Constitutional:      General: She is not in acute distress.    Appearance: Normal appearance. She is not ill-appearing.  Skin:    Comments: Area of swelling in left axillary region with mild surrounding erythema and small areas of fluctuance  Neurological:     Mental Status: She is alert.    UC Treatments / Results  Labs (all labs ordered are listed, but only abnormal results are displayed) Labs Reviewed - No data to display  EKG   Radiology No results found.  Procedures Incision and Drainage  Date/Time: 01/08/2020 7:39 PM Performed by: Rudolpho Sevin, NP Authorized by: Rudolpho Sevin, NP   Consent:    Consent obtained:  Verbal   Risks discussed:  Incomplete drainage, infection and pain   Alternatives discussed:  No treatment Universal protocol:    Procedure explained and questions answered to patient or proxy's satisfaction: yes   Location:    Type:  Abscess   Location:  Upper extremity Sedation:    Sedation type:  None Anesthesia:    Anesthesia method:  Local infiltration   Local anesthetic:  Lidocaine 2% w/o epi Procedure type:    Complexity:  Simple Procedure details:    Incision types:  Stab incision   Incision depth:  Dermal   Wound management:  Probed and deloculated and irrigated with saline    Drainage:  Purulent   Drainage amount:  Copious   Packing materials:  1/4 in iodoform gauze Post-procedure details:    Procedure completion:  Tolerated   (including critical care time)  Medications Ordered in UC Medications - No data to display  Initial Impression / Assessment and Plan / UC Course  I have reviewed the triage vital signs and the nursing notes.  Pertinent labs & imaging results that were  available during my care of the patient were reviewed by me and considered in my medical decision making (see chart for details).  Abscess, left axilla -I&D performed, packed with iodoform gauze -Care instructions reviewed -Patient to return for any fever/chills or worsening swelling, erythema  Reviewed expections re: course of current medical issues. Questions answered. Outlined signs and symptoms indicating need for more acute intervention. Pt verbalized understanding. AVS given  Final Clinical Impressions(s) / UC Diagnoses   Final diagnoses:  Abscess of left axilla     Discharge Instructions     Tried Aleve the gauze in for 2 days and then pull it out yourself.  It is okay if it falls out prior to this.  Take Tylenol or Motrin for pain.  Please return for any worsening pain, redness, fever or chills    ED Prescriptions    None     PDMP not reviewed this encounter.   Rudolpho Sevin, NP 01/10/20 1757

## 2020-01-15 DIAGNOSIS — J449 Chronic obstructive pulmonary disease, unspecified: Secondary | ICD-10-CM | POA: Diagnosis not present

## 2020-01-30 ENCOUNTER — Telehealth: Payer: Self-pay | Admitting: Internal Medicine

## 2020-01-30 MED ORDER — TRELEGY ELLIPTA 100-62.5-25 MCG/INH IN AEPB
1.0000 | INHALATION_SPRAY | Freq: Every day | RESPIRATORY_TRACT | 3 refills | Status: DC
Start: 2020-01-30 — End: 2020-10-12

## 2020-01-30 NOTE — Telephone Encounter (Signed)
Trelegy refill sent to pharmacy per request Was sent to mail in [facility] pharmacy 90-day supply.

## 2020-01-30 NOTE — Telephone Encounter (Signed)
LMTCB for the pt 

## 2020-02-06 ENCOUNTER — Encounter (HOSPITAL_COMMUNITY): Payer: Self-pay | Admitting: *Deleted

## 2020-04-02 ENCOUNTER — Encounter: Payer: Self-pay | Admitting: Family Medicine

## 2020-04-02 ENCOUNTER — Ambulatory Visit (INDEPENDENT_AMBULATORY_CARE_PROVIDER_SITE_OTHER): Payer: Medicare HMO | Admitting: Family Medicine

## 2020-04-02 ENCOUNTER — Other Ambulatory Visit: Payer: Self-pay

## 2020-04-02 VITALS — BP 110/76 | HR 90 | Temp 98.0°F | Ht 65.0 in | Wt 190.6 lb

## 2020-04-02 DIAGNOSIS — Z2821 Immunization not carried out because of patient refusal: Secondary | ICD-10-CM | POA: Diagnosis not present

## 2020-04-02 DIAGNOSIS — F17200 Nicotine dependence, unspecified, uncomplicated: Secondary | ICD-10-CM | POA: Diagnosis not present

## 2020-04-02 DIAGNOSIS — R42 Dizziness and giddiness: Secondary | ICD-10-CM | POA: Diagnosis not present

## 2020-04-02 DIAGNOSIS — Z7689 Persons encountering health services in other specified circumstances: Secondary | ICD-10-CM | POA: Diagnosis not present

## 2020-04-02 DIAGNOSIS — F40240 Claustrophobia: Secondary | ICD-10-CM | POA: Insufficient documentation

## 2020-04-02 DIAGNOSIS — E782 Mixed hyperlipidemia: Secondary | ICD-10-CM | POA: Diagnosis not present

## 2020-04-02 DIAGNOSIS — Z789 Other specified health status: Secondary | ICD-10-CM

## 2020-04-02 DIAGNOSIS — Z5320 Procedure and treatment not carried out because of patient's decision for unspecified reasons: Secondary | ICD-10-CM | POA: Diagnosis not present

## 2020-04-02 DIAGNOSIS — J449 Chronic obstructive pulmonary disease, unspecified: Secondary | ICD-10-CM | POA: Diagnosis not present

## 2020-04-02 NOTE — Progress Notes (Signed)
Subjective:    Patient ID: Rachel Wang, female    DOB: 1950/06/26, 70 y.o.   MRN: [id number]  HPI Chief Complaint  Patient presents with  . Establish Care    Non fasting   She is new to the practice and here to establish care.  Previous medical care: moved here in 2016 from [place]   Other providers: Cardiologist- Dr. Einar Gip  Pulmonologist- Dr. Chase Caller    COPD- states she is taking Trelegy and doing great. No recent flare ups. No use of albuterol lately.   Smoking 4 cigarettes per day. No plans to stop.   HL- on statin. States Dr. Einar Gip is managing this.   HTN- denies having this   History of stroke 10-12 years ago. States this was when she had pneumonia. No residual effects.   Reports history of vertigo which is intermittent and not that often.   States she is very claustrophobic.   Social history: widowed, lives alone, worked as a Quarry manager but retired now.  She plants flowers, tomatoes.  One daughter is in [place] and another one in [place]. She recently kicked her son out of her house.    She brought in advanced directives including HC POA and living will which are notarized.  Excerise: not lately   Denies fever, chills, dizziness, chest pain, palpitations, shortness of breath, abdominal pain, N/V/D, urinary symptoms, LE edema.    Immunizations: States her religion keeps her from getting vaccines.  Refuses Covid vaccines specifically.  States she has not had cancer screenings and does not plan on getting them.   Refuses mammogram, colonoscopy, DEXA.    Reviewed allergies, medications, past medical, surgical, family, and social history.    Review of Systems Pertinent positives and negatives in the history of present illness.     Objective:   Physical Exam BP 110/76   Pulse 90   Temp 98 F (36.7 C)   Ht 5\' 5"  (1.651 m)   Wt 190 lb 9.6 oz (86.5 kg)   SpO2 95%   BMI 31.72 kg/m   Alert and in no distress.  Cardiac exam shows a regular sinus  rhythm without murmurs or gallops. Lungs are clear to auscultation. Extremities without edema.  Skin is warm and dry.  Normal speech, mood and memory.      Assessment & Plan:  Mixed hyperlipidemia - Plan: CBC with Differential/Platelet, Comprehensive metabolic panel, Lipid panel -She is new to the practice and here to establish care.  She is reportedly taking a statin daily.  Unclear as to when her last lipid panel was done.  I will check this today.  Continue following with Dr. Einar Gip.  She denies having hypertension but this is in her chart per Dr. Einar Gip.  COPD, moderate ([place]) - Plan: CBC with Differential/Platelet, Comprehensive metabolic panel Appears to be doing quite well and no concerns.  Followed by a pulmonologist.  Current every day smoker - Plan: CBC with Differential/Platelet, Comprehensive metabolic panel -No plans to stop smoking her 4 cigarettes/day.  Encounter to establish care  Immunization declined -She is aware that I recommend the Covid vaccine as well as other vaccines but she declines.  Discussed that it is her right to decline  Colon cancer screening declined -Discussed that she could have underlying colon cancer and she is aware but does not want to have colon cancer screening.  Discussed that this is her right.  Mammogram declined -She declines breast cancer screening and is aware that she could have underlying breast  cancer. She declines all cancer screenings today.  She is aware that I will ask her annually and that if she changes her mind to let me know.

## 2020-04-03 LAB — COMPREHENSIVE METABOLIC PANEL
ALT: 14 IU/L (ref 0–32)
AST: 18 IU/L (ref 0–40)
Albumin/Globulin Ratio: 1.2 (ref 1.2–2.2)
Albumin: 4.2 g/dL (ref 3.8–4.8)
Alkaline Phosphatase: 106 IU/L (ref 44–121)
BUN/Creatinine Ratio: 6 — ABNORMAL LOW (ref 12–28)
BUN: 5 mg/dL — ABNORMAL LOW (ref 8–27)
Bilirubin Total: 0.2 mg/dL (ref 0.0–1.2)
CO2: 23 mmol/L (ref 20–29)
Calcium: 10 mg/dL (ref 8.7–10.3)
Chloride: 100 mmol/L (ref 96–106)
Creatinine, Ser: 0.82 mg/dL (ref 0.57–1.00)
Globulin, Total: 3.4 g/dL (ref 1.5–4.5)
Glucose: 102 mg/dL — ABNORMAL HIGH (ref 65–99)
Potassium: 3.5 mmol/L (ref 3.5–5.2)
Sodium: 138 mmol/L (ref 134–144)
Total Protein: 7.6 g/dL (ref 6.0–8.5)
eGFR: 77 mL/min/{1.73_m2} (ref >59)

## 2020-04-03 LAB — CBC WITH DIFFERENTIAL/PLATELET
Basophils Absolute: 0.1 10*3/uL (ref 0.0–0.2)
Basos: 1 %
EOS (ABSOLUTE): 0.3 10*3/uL (ref 0.0–0.4)
Eos: 4 %
Hematocrit: 41.7 % (ref 34.0–46.6)
Hemoglobin: 14 g/dL (ref 11.1–15.9)
Immature Grans (Abs): 0 10*3/uL (ref 0.0–0.1)
Immature Granulocytes: 0 %
Lymphocytes Absolute: 3.1 10*3/uL (ref 0.7–3.1)
Lymphs: 46 %
MCH: 29.4 pg (ref 26.6–33.0)
MCHC: 33.6 g/dL (ref 31.5–35.7)
MCV: 87 fL (ref 79–97)
Monocytes Absolute: 0.6 10*3/uL (ref 0.1–0.9)
Monocytes: 10 %
Neutrophils Absolute: 2.6 10*3/uL (ref 1.4–7.0)
Neutrophils: 39 %
Platelets: 330 10*3/uL (ref 150–450)
RBC: 4.77 x10E6/uL (ref 3.77–5.28)
RDW: 14.2 % (ref 11.7–15.4)
WBC: 6.7 10*3/uL (ref 3.4–10.8)

## 2020-04-03 LAB — LIPID PANEL
Chol/HDL Ratio: 4.6 ratio — ABNORMAL HIGH (ref 0.0–4.4)
Cholesterol, Total: 156 mg/dL (ref 100–199)
HDL: 34 mg/dL — ABNORMAL LOW (ref >39)
LDL Chol Calc (NIH): 98 mg/dL (ref 0–99)
Triglycerides: 134 mg/dL (ref 0–149)
VLDL Cholesterol Cal: 24 mg/dL (ref 5–40)

## 2020-04-06 ENCOUNTER — Telehealth: Payer: Self-pay

## 2020-04-06 NOTE — Telephone Encounter (Signed)
Called [employer] t# [phone] ref# [id number] said Procedure is good to go and I asked for authorization # & they advised need to speak to [employer] [facility].  I called [employer] [facility] t# [phone] was advised the authorization by pulmonology is valid for CT scan based on [place] guidelines as any participating provider can get authorization and provide services even if they are not the PCP.  Authorization # [id number] and valid from 04/10/20 til 05/10/20

## 2020-04-08 NOTE — Progress Notes (Signed)
She has not seen her results. I would also like to forward her labs to Dr. Einar Gip please.

## 2020-04-10 ENCOUNTER — Other Ambulatory Visit: Payer: Self-pay

## 2020-04-10 ENCOUNTER — Ambulatory Visit (INDEPENDENT_AMBULATORY_CARE_PROVIDER_SITE_OTHER)
Admission: RE | Admit: 2020-04-10 | Discharge: 2020-04-10 | Disposition: A | Discharge status: Home / Self Care | Payer: Medicare HMO | Source: Ambulatory Visit | Attending: Internal Medicine | Admitting: Internal Medicine

## 2020-04-10 ENCOUNTER — Telehealth: Payer: Self-pay

## 2020-04-10 DIAGNOSIS — J449 Chronic obstructive pulmonary disease, unspecified: Secondary | ICD-10-CM

## 2020-04-10 DIAGNOSIS — R918 Other nonspecific abnormal finding of lung field: Secondary | ICD-10-CM | POA: Diagnosis not present

## 2020-04-10 DIAGNOSIS — I708 Atherosclerosis of other arteries: Secondary | ICD-10-CM | POA: Diagnosis not present

## 2020-04-10 DIAGNOSIS — J479 Bronchiectasis, uncomplicated: Secondary | ICD-10-CM | POA: Diagnosis not present

## 2020-04-10 DIAGNOSIS — I2510 Atherosclerotic heart disease of native coronary artery without angina pectoris: Secondary | ICD-10-CM | POA: Diagnosis not present

## 2020-04-10 DIAGNOSIS — J984 Other disorders of lung: Secondary | ICD-10-CM | POA: Diagnosis not present

## 2020-04-10 NOTE — Telephone Encounter (Signed)
Pt had left message with Juliann Pulse and I called her back and left message

## 2020-04-14 DIAGNOSIS — J449 Chronic obstructive pulmonary disease, unspecified: Secondary | ICD-10-CM | POA: Diagnosis not present

## 2020-04-14 NOTE — Telephone Encounter (Signed)
DONE

## 2020-04-22 NOTE — Progress Notes (Signed)
CT 04/11/20 without change from 2019. Pls give 15 min followup in May 2022. Opening May schedule soon

## 2020-05-15 DIAGNOSIS — J449 Chronic obstructive pulmonary disease, unspecified: Secondary | ICD-10-CM | POA: Diagnosis not present

## 2020-06-07 ENCOUNTER — Ambulatory Visit: Payer: Medicare HMO | Admitting: Internal Medicine

## 2020-06-07 ENCOUNTER — Other Ambulatory Visit: Payer: Self-pay

## 2020-06-07 ENCOUNTER — Encounter: Payer: Self-pay | Admitting: Internal Medicine

## 2020-06-07 VITALS — BP 108/64 | HR 85 | Temp 98.5°F | Ht 65.0 in | Wt 191.2 lb

## 2020-06-07 DIAGNOSIS — I2510 Atherosclerotic heart disease of native coronary artery without angina pectoris: Secondary | ICD-10-CM

## 2020-06-07 DIAGNOSIS — Z7185 Encounter for immunization safety counseling: Secondary | ICD-10-CM | POA: Diagnosis not present

## 2020-06-07 DIAGNOSIS — J449 Chronic obstructive pulmonary disease, unspecified: Secondary | ICD-10-CM

## 2020-06-07 DIAGNOSIS — Z87891 Personal history of nicotine dependence: Secondary | ICD-10-CM | POA: Diagnosis not present

## 2020-06-07 NOTE — Addendum Note (Signed)
Addended by: Lorretta Harp on: 06/07/2020 01:53 PM   Modules accepted: Orders

## 2020-06-07 NOTE — Progress Notes (Signed)
OV 09/02/2017  Chief Complaint  Patient presents with  . Follow-up    PFT performed 08/31/17.  Pt states she has been doing well since last visit. States she still becomes SOB with exertion.     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 She continues to do well. No interim complaints or exacerbations. COPD cat score is 33 and she is significant amount of symptoms. Although she does feel stable and better. A CT scan of the chest was in 2017 without any cancer. She continues to smoke unable to quit. There are no new issues. She says she cannot do pulmonary rehabilitation because she has to visit her husband was in a nursing home of bilateral strokes. She is happy with her triple inhaler therapy. But on deeper questioning she does admit out of proportion shortness of breath. In fact rated level V out of 5.    OV 10/12/2019   Subjective:  Patient ID: Rachel Rachel Wang, female , DOB: 07/30/1950, age 70 y.o. years. , MRN: [id number],  ADDRESS: [place] [address] PCP  Patient, No Pcp Per Providers : Treatment Team:  Attending Provider: Brand Males, MD   Chief Complaint  Patient presents with  . Follow-up    green phlem in throat, runs course for a week.  Not this anymore, more clear than green.       HPI Rachel Rachel Wang 70 y.o. -     CAT COPD Symptom & Quality of Life Score ([place] trademark) 0 is no burden. 5 is highest burden 09/02/2017   Never Cough -> Cough all the time 4  No phlegm in chest -> Chest is full of phlegm 4  No chest tightness -> Chest feels very tight 3  No dyspnea for 1 flight stairs/hill -> Very dyspneic for 1 flight of stairs 5  No limitations for ADL at home -> Very limited with ADL at home 4  Confident leaving home -> Not at all confident leaving home 4  Sleep soundly -> Do not sleep soundly because of lung condition 5  Lots of Energy -> No energy at all 4  TOTAL Score (max 40)  33          Results for Rachel, Rachel Wang (MRN [id number]) as of 09/02/2017 11:43  Ref. Range 12/28/2015 13:45 08/31/2017 14:23  FEV1-Post Latest Units: L 1.30 1.31  FEV1-%Pred-Post Latest Units: % 63 65   Results for Rachel, Rachel Wang (MRN [id number]) as of 09/02/2017 11:43  Ref. Range 12/28/2015 13:45 08/31/2017 14:23  DLCO unc Latest Units: ml/min/mmHg 9.64 13.80  DLCO unc % pred Latest Units: % 37 53      OV 06/07/2020  Subjective:  Patient ID: Rachel Rachel Wang, female , DOB: Apr 28, 1950 , age 70 y.o. , MRN: [id number] , ADDRESS: [place] [address] PCP Girtha Rm, NP-C Patient Care Team: Girtha Rm, NP-C as PCP - General (Family Medicine) Adrian Prows, MD as Consulting Physician (Cardiology)  This Provider for this visit: Treatment Team:  Attending Provider: Brand Males, MD    06/07/2020 -   Chief Complaint  Patient presents with  . Follow-up    Pt had recent CT performed and is here today to discuss the results.  Pt states seh has been doing okay since last visit and denies any real complaints.   Follow-up Gold stage II COPD Follow-up smoking  HPI Rachel Rachel Wang 70 y.o. -returns for follow-up.  She says she still smokes  and has cut down her smoking though.  She smokes anywhere from 1 to 3 cigarettes daily.  She does not want medication help to quit smoking.  Has COPD stable on Trelegy.  She had CT scan of the chest personally visualized.  No change from 2019.  She does not have any chest pains.  She continues to be reluctant about taking vaccines stable disease   CAT Score 03/02/2017  Total CAT Score 36       CT Chest data march 2022   IMPRESSION: 1. No significant change from CT 09/18/2017. 2. Bronchiectasis and subpleural cystic change similar to comparison exam. 3. Stable small pulmonary nodules. 4. Coronary artery calcification and Aortic Atherosclerosis (ICD10-I70.0).   Electronically Signed   By: Suzy Bouchard M.D.   On:  04/11/2020 10:29  No results found.    PFT  PFT Results Latest Ref Rng & Units 08/31/2017 12/28/2015  FVC-Pre L 1.51 1.66  FVC-Predicted Pre % 58 63  FVC-Post L 1.54 1.68  FVC-Predicted Post % 59 63  Pre FEV1/FVC % % 86 75  Post FEV1/FCV % % 85 78  FEV1-Pre L 1.29 1.24  FEV1-Predicted Pre % 64 60  FEV1-Post L 1.31 1.30  DLCO uncorrected ml/min/mmHg 13.80 9.64  DLCO UNC% % 53 37  DLCO corrected ml/min/mmHg 13.98 -  DLCO COR %Predicted % 54 -  DLVA Predicted % 95 78  TLC L - 3.52  TLC % Predicted % - 67  RV % Predicted % - 89       has a past medical history of Bronchitis, Claustrophobia, COPD (chronic obstructive pulmonary disease) ([place]), Emphysema of lung ([place]), Hyperlipidemia, Stroke ([place]), and Vertigo.   reports that she has been smoking cigarettes. She started smoking about 70 years ago. She has a 70.50 pack-year smoking history. She has never used smokeless tobacco.  Past Surgical History:  Procedure Laterality Date  . TUBAL LIGATION      Allergies  Allergen Reactions  . Levofloxacin Nausea And Vomiting  . Tramadol Nausea And Vomiting  . Adhesive [Tape] Rash and Other (See Comments)    WELTS, also  . Latex Rash and Other (See Comments)    WELTS, also    Immunization History  Administered Date(s) Administered  . Pneumococcal Conjugate-13 04/03/2017  . Pneumococcal Polysaccharide-23 11/15/2018    Family History  Problem Relation Age of Onset  . HIV Father   . Lung cancer Sister   . Lung cancer Paternal Aunt   . Stroke Maternal Grandmother   . Heart attack Maternal Grandfather   . Stroke Paternal Grandmother   . Hypertension Paternal Grandmother   . Heart attack Paternal Grandfather      Current Outpatient Medications:  .  aspirin 81 MG chewable tablet, Chew 81 mg by mouth daily., Disp: , Rfl:  .  atorvastatin (LIPITOR) 40 MG tablet, Take 1 tablet (40 mg total) by mouth daily., Disp: 90 tablet, Rfl: 1 .  bisoprolol (ZEBETA) 5 MG tablet, Take 0.5  tablets (2.5 mg total) by mouth daily., Disp: 90 tablet, Rfl: 1 .  calcium carbonate (TUMS - DOSED IN MG ELEMENTAL CALCIUM) 500 MG chewable tablet, Chew 1-2 tablets by mouth as needed for indigestion or heartburn., Disp: , Rfl:  .  Fluticasone-Umeclidin-Vilant (TRELEGY ELLIPTA) 100-62.5-25 MCG/INH AEPB, Inhale 1 puff into the lungs daily., Disp: 3 each, Rfl: 3 .  Multiple Vitamins-Minerals (MULTIVITAMIN ADULTS 50+ PO), Take 1 tablet by mouth daily. , Disp: , Rfl:  .  VITAMIN D PO, Take 1,000  Int'l Units/kg/day by mouth., Disp: , Rfl:       Objective:   Vitals:   06/07/20 1323  BP: 108/64  Pulse: 85  Temp: 98.5 F (36.9 C)  TempSrc: Temporal  SpO2: 96%  Weight: 191 lb 3.2 oz (86.7 kg)  Height: 5\' 5"  (1.651 m)    Estimated body mass index is 31.82 kg/m as calculated from the following:   Height as of this encounter: 5\' 5"  (1.651 m).   Weight as of this encounter: 191 lb 3.2 oz (86.7 kg).  @WEIGHTCHANGE @  [employer]   06/07/20 1323  Weight: 191 lb 3.2 oz (86.7 kg)     Physical Exam General: No distress. Looks well Neuro: Alert and Oriented x 3. GCS 15. Speech normal Psych: Pleasant Resp:  Barrel Chest - nno.  Wheeze - no, Crackles - no, No overt respiratory distress CVS: Normal heart sounds. Murmurs - no Ext: Stigmata of Connective Tissue Disease - no HEENT: Normal upper airway. PEERL +. No post nasal drip        Assessment:       ICD-10-CM   1. COPD, moderate ([place])  J44.9   2. History of smoking 25-50 pack years  Z87.891   3. Vaccine counseling  Z71.85   4. Coronary artery calcification seen on CAT scan  I25.10        Plan:     Patient Instructions  COPD, moderate (HCC)  -Stable disease  Plan - Continue Trelegy daily with albuterol as needed  History of smoking 25-50 pack years   -Glad you are working on quitting and have reduce the amount of smoke  Plan - Continue work on reducing smoking and ultimately quit  Vaccine counseling  -Plan -  Respect refusal of vaccines  Coronary artery calcification seen on CAT scan -seen on CT scan of the chest March 2022  Plan - Refer cardiology [facility]  Follow-up - Return in 9 months or sooner if needed       SIGNATURE    Dr. Brand Males, M.D., F.C.C.P,  Pulmonary and Critical Care Medicine Staff Physician, [place] Director - Interstitial Lung Disease  Program  Pulmonary [place] at [place], [place], [address]  Pager: [phone], If no answer or between  15:00h - 7:00h: call [phone] Telephone: [phone]  1:47 PM 06/07/2020

## 2020-06-07 NOTE — Patient Instructions (Signed)
COPD, moderate ([place])  -Stable disease  Plan - Continue Trelegy daily with albuterol as needed  History of smoking 25-50 pack years   -Glad you are working on quitting and have reduce the amount of smoke  Plan - Continue work on reducing smoking and ultimately quit  Vaccine counseling  -Plan - Respect refusal of vaccines  Coronary artery calcification seen on CAT scan -seen on CT scan of the chest March 2022  Plan - Refer cardiology [facility] cardiovascular  Follow-up - Return in 9 months or sooner if needed

## 2020-06-13 ENCOUNTER — Other Ambulatory Visit: Payer: Self-pay | Admitting: Cardiology

## 2020-06-14 DIAGNOSIS — J449 Chronic obstructive pulmonary disease, unspecified: Secondary | ICD-10-CM | POA: Diagnosis not present

## 2020-07-15 DIAGNOSIS — J449 Chronic obstructive pulmonary disease, unspecified: Secondary | ICD-10-CM | POA: Diagnosis not present

## 2020-07-16 ENCOUNTER — Telehealth: Payer: Self-pay | Admitting: Internal Medicine

## 2020-07-16 MED ORDER — DOXYCYCLINE HYCLATE 100 MG PO TABS: 100.0000 mg | ORAL_TABLET | Freq: Two times a day (BID) | ORAL | 0 refills | Status: DC

## 2020-07-16 MED ORDER — PREDNISONE 10 MG PO TABS: ORAL_TABLET | ORAL | 0 refills | Status: DC

## 2020-07-16 NOTE — Telephone Encounter (Signed)
Likely aecopd  Take doxycycline 100mg  po twice daily x 5 days; take after meals and avoid sunlight   Please take prednisone 40 mg x1 day, then 30 mg x1 day, then 20 mg x1 day, then 10 mg x1 day, and then 5 mg x1 day and stop  Shuold try to get covid antigen home test -if ppsitive call us back   Allergies  Allergen Reactions   Levofloxacin Nausea And Vomiting   Tramadol Nausea And Vomiting   Adhesive [Tape] Rash and Other (See Comments)    WELTS, also   Latex Rash and Other (See Comments)    WELTS, also

## 2020-07-16 NOTE — Telephone Encounter (Signed)
Primary Pulmonologist: Ramaswamy Last office visit and with whom: 5/12/225 MR What do we see them for (pulmonary problems): COPD, moderate ([place])  History of smoking 25-50 pack years  Vaccine counseling  Coronary artery calcification seen on CAT scan  Last OV assessment/plan:  Assessment:         ICD-10-CM    1. COPD, moderate ([place]) J44.9    2. History of smoking 25-50 pack years Z87.891    3. Vaccine counseling Z71.85    4. Coronary artery calcification seen on CAT scan I25.10           Plan:     Patient Instructions  COPD, moderate (HCC)   -Stable disease   Plan - Continue Trelegy daily with albuterol as needed   History of smoking 25-50 pack years    -Glad you are working on quitting and have reduce the amount of smoke   Plan - Continue work on reducing smoking and ultimately quit   Vaccine counseling   -Plan - Respect refusal of vaccines   Coronary artery calcification seen on CAT scan -seen on CT scan of the chest March 2022   Plan - Refer cardiology [facility]   Follow-up - Return in 9 months or sooner if needed    Was appointment offered to patient (explain)?  Pt wanted Dr. Golden Pop recommendations prior to coming into office    Reason for call: Pt states productive cough with thick green phlegm x 3 days. NO fever/nausea/vomiting/diarrhea.  Pt has tried OTC Robitussin without any relief.  Pt would like to have medication called into [facility] mail pharmacy as she does not use local pharmacy. Dr. Chase Caller please advise.   (examples of things to ask: : When did symptoms start? Fever? Cough? Productive? Color to sputum? More sputum than usual? Wheezing? Have you needed increased oxygen? Are you taking your respiratory medications? What over the counter measures have you tried?)  Allergies  Allergen Reactions   Levofloxacin Nausea And Vomiting   Tramadol Nausea And Vomiting   Adhesive [Tape] Rash and Other (See Comments)    WELTS, also    Latex Rash and Other (See Comments)    WELTS, also    Immunization History  Administered Date(s) Administered   Pneumococcal Conjugate-13 04/03/2017   Pneumococcal Polysaccharide-23 11/15/2018

## 2020-07-16 NOTE — Telephone Encounter (Signed)
Patient is aware of MR's recommendations. She voiced her understanding.  Rx for doxycycline and prednisone has been sent to preferred pharmacy.  Nothing further needed at this time.

## 2020-08-14 DIAGNOSIS — J449 Chronic obstructive pulmonary disease, unspecified: Secondary | ICD-10-CM | POA: Diagnosis not present

## 2020-09-14 DIAGNOSIS — J449 Chronic obstructive pulmonary disease, unspecified: Secondary | ICD-10-CM | POA: Diagnosis not present

## 2020-09-21 ENCOUNTER — Telehealth: Payer: Self-pay | Admitting: Internal Medicine

## 2020-09-21 NOTE — Telephone Encounter (Signed)
LMTCB  It does not appear the patient is on prednisone long term, will need to get more information.

## 2020-09-28 NOTE — Telephone Encounter (Signed)
Called and left a detailed msg on machine ok per DPR letting her know to call us if needing medication.

## 2020-10-12 ENCOUNTER — Ambulatory Visit: Payer: Medicare HMO | Admitting: Internal Medicine

## 2020-10-12 ENCOUNTER — Other Ambulatory Visit: Payer: Self-pay

## 2020-10-12 ENCOUNTER — Encounter: Payer: Self-pay | Admitting: Internal Medicine

## 2020-10-12 VITALS — BP 112/76 | HR 65 | Ht 65.0 in | Wt 189.0 lb

## 2020-10-12 DIAGNOSIS — J441 Chronic obstructive pulmonary disease with (acute) exacerbation: Secondary | ICD-10-CM | POA: Diagnosis not present

## 2020-10-12 DIAGNOSIS — Z7185 Encounter for immunization safety counseling: Secondary | ICD-10-CM | POA: Diagnosis not present

## 2020-10-12 DIAGNOSIS — I2510 Atherosclerotic heart disease of native coronary artery without angina pectoris: Secondary | ICD-10-CM

## 2020-10-12 DIAGNOSIS — J449 Chronic obstructive pulmonary disease, unspecified: Secondary | ICD-10-CM | POA: Diagnosis not present

## 2020-10-12 DIAGNOSIS — Z87891 Personal history of nicotine dependence: Secondary | ICD-10-CM | POA: Diagnosis not present

## 2020-10-12 MED ORDER — BREZTRI AEROSPHERE 160-9-4.8 MCG/ACT IN AERO: 2.0000 | INHALATION_SPRAY | Freq: Two times a day (BID) | RESPIRATORY_TRACT | 0 refills | Status: DC

## 2020-10-12 MED ORDER — DOXYCYCLINE HYCLATE 100 MG PO TABS: 100.0000 mg | ORAL_TABLET | Freq: Two times a day (BID) | ORAL | 0 refills | Status: DC

## 2020-10-12 MED ORDER — PREDNISONE 10 MG PO TABS: ORAL_TABLET | ORAL | 0 refills | Status: AC

## 2020-10-12 NOTE — Patient Instructions (Addendum)
COPD, moderate ([place]) Copd exacerbation  - you are In flare up after running out of trelegy due to cost reasons in august 2022  Plan - Take prednisone 40 mg daily x 2 days, then '20mg'$  daily x 2 days, then '10mg'$  daily x 2 days, then '5mg'$  daily x 2 days and stop - Take doxycycline '100mg'$  po twice daily x 5 days; take after meals and avoid sunlight - take sample breztri 2 puff twice daily  - take prescription for this and price it out; if expensive call us - use albuterol as needed  History of smoking 25-50 pack years   -Glad you are working on quitting and have reduce the amount of smoke  Plan - Continue work on reducing smoking and ultimately quit  Vaccine counseling  -Plan - Respect refusal of vaccines  Coronary artery calcification seen on CAT scan -seen on CT scan of the chest March 2022  Plan - Refer cardiology [facility] cardiovascular  Follow-up - Return to see app in 3 months or sooner if needed

## 2020-10-12 NOTE — Progress Notes (Signed)
OV 09/02/2017  Chief Complaint  Patient presents with   Follow-up    PFT performed 08/31/17.  Pt states she has been doing well since last visit. States she still becomes SOB with exertion.     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 She continues to do well. No interim complaints or exacerbations. COPD cat score is 33 and she is significant amount of symptoms. Although she does feel stable and better. A CT scan of the chest was in 2017 without any cancer. She continues to smoke unable to quit. There are no new issues. She says she cannot do pulmonary rehabilitation because she has to visit her husband was in a nursing home of bilateral strokes. She is happy with her triple inhaler therapy. But on deeper questioning she does admit out of proportion shortness of breath. In fact rated level V out of 5.    OV 10/12/2019   Subjective:  Patient ID: Rachel Wang, female , DOB: 1950-03-29, age 70 y.o. years. , MRN: [id number],  ADDRESS: [place] [address] PCP  Patient, No Pcp Per Providers : Treatment Team:  Attending Provider: Brand Males, MD   Chief Complaint  Patient presents with   Follow-up    green phlem in throat, runs course for a week.  Not this anymore, more clear than green.      CAT COPD Symptom & Quality of Life Score (GSK trademark) 0 is no burden. 5 is highest burden 09/02/2017   Never Cough -> Cough all the time 4  No phlegm in chest -> Chest is full of phlegm 4  No chest tightness -> Chest feels very tight 3  No dyspnea for 1 flight stairs/hill -> Very dyspneic for 1 flight of stairs 5  No limitations for ADL at home -> Very limited with ADL at home 4  Confident leaving home -> Not at all confident leaving home 4  Sleep soundly -> Do not sleep soundly because of lung condition 5  Lots of Energy -> No energy at all 4  TOTAL Score (max 40)  33         Results for Rachel, Wang (MRN  [id number]) as of 09/02/2017 11:43  Ref. Range 12/28/2015 13:45 08/31/2017 14:23  FEV1-Post Latest Units: L 1.30 1.31  FEV1-%Pred-Post Latest Units: % 63 65   Results for Rachel, Wang (MRN [id number]) as of 09/02/2017 11:43  Ref. Range 12/28/2015 13:45 08/31/2017 14:23  DLCO unc Latest Units: ml/min/mmHg 9.64 13.80  DLCO unc % pred Latest Units: % 37 53      OV 06/07/2020  Subjective:  Patient ID: Rachel Wang, female , DOB: 1951/01/27 , age 43 y.o. , MRN: [id number] , ADDRESS: [place] [address] PCP Girtha Rm, NP-C Patient Care Team: Girtha Rm, NP-C as PCP - General (Family Medicine) Adrian Prows, MD as Consulting Physician (Cardiology)  This Provider for this visit: Treatment Team:  Attending Provider: Brand Males, MD    06/07/2020 -   Chief Complaint  Patient presents with   Follow-up    Pt had recent CT performed and is here today to discuss the results.  Pt states seh has been doing okay since last visit and denies any real complaints.   Follow-up Gold stage II COPD Follow-up smoking  HPI Rachel Wang 70 y.o. -returns for follow-up.  She says she still smokes and has cut down her smoking though.  She smokes anywhere from  1 to 3 cigarettes daily.  She does not want medication help to quit smoking.  Has COPD stable on Trelegy.  She had CT scan of the chest personally visualized.  No change from 2019.  She does not have any chest pains.  She continues to be reluctant about taking vaccines stable disease     CT Chest data march 2022   IMPRESSION: 1. No significant change from CT 09/18/2017. 2. Bronchiectasis and subpleural cystic change similar to comparison exam. 3. Stable small pulmonary nodules. 4. Coronary artery calcification and Aortic Atherosclerosis (ICD10-I70.0).     Electronically Signed   By: Suzy Bouchard M.D.   On: 04/11/2020 10:29  No results found.     OV 10/12/2020  Subjective:  Patient ID: Rachel Wang, female , DOB: 1950-08-02 , age 60 y.o. , MRN: [id number] , ADDRESS: [place] [address] PCP Girtha Rm, NP-C Patient Care Team: Girtha Rm, NP-C as PCP - General (Family Medicine) Adrian Prows, MD as Consulting Physician (Cardiology)  This Provider for this visit: Treatment Team:  Attending Provider: Brand Males, MD    Follow-up Gold stage II COPD Follow-up smoking  10/12/2020 -   Chief Complaint  Patient presents with   Follow-up    Patient reports that she has not had trelegy x 1 month, She reports worse since she has not been able to get on this medication.      HPI Rachel Wang 70 y.o. -  Advanced COPD Ongoing smoking Vaccine hesitancy Coronary artery calcification Cancer screening last CT scan March 2022 without nodules   HPI Rachel Wang 70 y.o. -returns for follow-up.  She tells me that in August 2022 she ran out of her Trelegy because of cost issues.  The co-pay for 3 months went up from $150 plus to $400 plus.  She did not call our office because she did not know we could help.  She thinks that the donut hole issue.  She is not aware of any insurance plan change.  Then in the last few to several week she is having increasing cough, chest tightness, shortness of breath and green sputum.  She feels it is the worst she has been but she does not think she needs an admission.  She is cut down on her smoking.  Last visit I referred her to cardiology but apparently the referral did not go through.  She is willing to look at a repeat referral.   CAT Score 10/12/2020 03/02/2017  Total CAT Score 22 36      PFT  PFT Results Latest Ref Rng & Units 08/31/2017 12/28/2015  FVC-Pre L 1.51 1.66  FVC-Predicted Pre % 58 63  FVC-Post L 1.54 1.68  FVC-Predicted Post % 59 63  Pre FEV1/FVC % % 86 75  Post FEV1/FCV % % 85 78  FEV1-Pre L 1.29 1.24  FEV1-Predicted Pre % 64 60  FEV1-Post L 1.31 1.30  DLCO uncorrected ml/min/mmHg 13.80 9.64   DLCO UNC% % 53 37  DLCO corrected ml/min/mmHg 13.98 -  DLCO COR %Predicted % 54 -  DLVA Predicted % 95 78  TLC L - 3.52  TLC % Predicted % - 67  RV % Predicted % - 89       has a past medical history of Bronchitis, Claustrophobia, COPD (chronic obstructive pulmonary disease) ([place]), Emphysema of lung ([place]), Hyperlipidemia, Stroke ([place]), and Vertigo.   reports that she has been smoking cigarettes. She started smoking about 53 years ago. She  has a 25.50 pack-year smoking history. She has never used smokeless tobacco.  Past Surgical History:  Procedure Laterality Date   TUBAL LIGATION      Allergies  Allergen Reactions   Levofloxacin Nausea And Vomiting   Tramadol Nausea And Vomiting   Adhesive [Tape] Rash and Other (See Comments)    WELTS, also   Latex Rash and Other (See Comments)    WELTS, also    Immunization History  Administered Date(s) Administered   Pneumococcal Conjugate-13 04/03/2017   Pneumococcal Polysaccharide-23 11/15/2018    Family History  Problem Relation Age of Onset   HIV Father    Lung cancer Sister    Lung cancer Paternal Aunt    Stroke Maternal Grandmother    Heart attack Maternal Grandfather    Stroke Paternal Grandmother    Hypertension Paternal Grandmother    Heart attack Paternal Grandfather      Current Outpatient Medications:    aspirin 81 MG chewable tablet, Chew 81 mg by mouth daily., Disp: , Rfl:    atorvastatin (LIPITOR) 40 MG tablet, TAKE 1 TABLET EVERY DAY, Disp: 90 tablet, Rfl: 1   bisoprolol (ZEBETA) 5 MG tablet, Take 0.5 tablets (2.5 mg total) by mouth daily., Disp: 90 tablet, Rfl: 1   Budeson-Glycopyrrol-Formoterol (BREZTRI AEROSPHERE) 160-9-4.8 MCG/ACT AERO, Inhale 2 puffs into the lungs in the morning and at bedtime., Disp: 10.7 g, Rfl: 0   calcium carbonate (TUMS - DOSED IN MG ELEMENTAL CALCIUM) 500 MG chewable tablet, Chew 1-2 tablets by mouth as needed for indigestion or heartburn., Disp: , Rfl:    doxycycline  (VIBRA-TABS) 100 MG tablet, Take 1 tablet (100 mg total) by mouth 2 (two) times daily., Disp: 10 tablet, Rfl: 0   Multiple Vitamins-Minerals (MULTIVITAMIN ADULTS 50+ PO), Take 1 tablet by mouth daily. , Disp: , Rfl:    predniSONE (DELTASONE) 10 MG tablet, Take 4 tablets (40 mg total) by mouth daily for 2 days, THEN 2 tablets (20 mg total) daily for 2 days, THEN 0.5 tablets (5 mg total) daily for 2 days., Disp: 13 tablet, Rfl: 0   VITAMIN D PO, Take 1,000 Int'l Units/kg/day by mouth., Disp: , Rfl:       Objective:   Vitals:   10/12/20 1611  BP: 112/76  Pulse: 65  SpO2: 98%  Weight: 189 lb (85.7 kg)  Height: '5\' 5"'$  (1.651 m)    Estimated body mass index is 31.45 kg/m as calculated from the following:   Height as of this encounter: '5\' 5"'$  (1.651 m).   Weight as of this encounter: 189 lb (85.7 kg).  '@WEIGHTCHANGE'$ @  [employer]   10/12/20 1611  Weight: 189 lb (85.7 kg)     Physical Exam    General: No distress. Somewhat tearful Neuro: Alert and Oriented x 3. GCS 15. Speech normal Psych: Pleasant Resp:  Barrel Chest - no.  Wheeze - yes, Crackles - no, No overt respiratory distress CVS: Normal heart sounds. Murmurs - no Ext: Stigmata of Connective Tissue Disease - no HEENT: Normal upper airway. PEERL +. No post nasal drip        Assessment:       ICD-10-CM   1. COPD exacerbation ([place])  J44.1 Ambulatory referral to Cardiology    2. COPD, moderate ([place])  J44.9     3. History of smoking 25-50 pack years  Z87.891     4. Coronary artery calcification seen on CAT scan  I25.10 Ambulatory referral to Cardiology    5. Vaccine counseling  Z71.85          Plan:     Patient Instructions  COPD, moderate ([place]) Copd exacerbation  - you are In flare up after running out of trelegy due to cost reasons in august 2022  Plan - Take prednisone 40 mg daily x 2 days, then '20mg'$  daily x 2 days, then '10mg'$  daily x 2 days, then '5mg'$  daily x 2 days and stop - Take doxycycline '100mg'$   po twice daily x 5 days; take after meals and avoid sunlight - take sample breztri 2 puff twice daily  - take prescription for this and price it out; if expensive call us - use albuterol as needed  History of smoking 25-50 pack years   -Glad you are working on quitting and have reduce the amount of smoke  Plan - Continue work on reducing smoking and ultimately quit  Vaccine counseling  -Plan - Respect refusal of vaccines  Coronary artery calcification seen on CAT scan -seen on CT scan of the chest March 2022  Plan - Refer cardiology [facility] cardiovascular  Follow-up - Return to see app in 3 months or sooner if needed    SIGNATURE    Dr. Brand Males, M.D., F.C.C.P,  Pulmonary and Critical Care Medicine Staff Physician, [place] Director - Interstitial Lung Disease  Program  Pulmonary [place] at [place], [place], [address]  Pager: [phone], If no answer or between  15:00h - 7:00h: call [phone] Telephone: [phone]  5:39 PM 10/12/2020

## 2020-10-15 DIAGNOSIS — J449 Chronic obstructive pulmonary disease, unspecified: Secondary | ICD-10-CM | POA: Diagnosis not present

## 2020-11-14 DIAGNOSIS — J449 Chronic obstructive pulmonary disease, unspecified: Secondary | ICD-10-CM | POA: Diagnosis not present

## 2020-11-24 ENCOUNTER — Other Ambulatory Visit: Payer: Self-pay | Admitting: Cardiology

## 2020-11-24 DIAGNOSIS — I10 Essential (primary) hypertension: Secondary | ICD-10-CM

## 2020-12-07 ENCOUNTER — Other Ambulatory Visit: Payer: Self-pay

## 2020-12-07 ENCOUNTER — Encounter: Payer: Self-pay | Admitting: Cardiology

## 2020-12-07 ENCOUNTER — Ambulatory Visit: Payer: Medicare HMO | Admitting: Cardiology

## 2020-12-07 DIAGNOSIS — I2510 Atherosclerotic heart disease of native coronary artery without angina pectoris: Secondary | ICD-10-CM | POA: Insufficient documentation

## 2020-12-07 DIAGNOSIS — J449 Chronic obstructive pulmonary disease, unspecified: Secondary | ICD-10-CM | POA: Diagnosis not present

## 2020-12-07 DIAGNOSIS — Z720 Tobacco use: Secondary | ICD-10-CM | POA: Diagnosis not present

## 2020-12-07 NOTE — Patient Instructions (Signed)
Medication Instructions:  The current medical regimen is effective;  continue present plan and medications.  *If you need a refill on your cardiac medications before your next appointment, please call your pharmacy*  Follow-Up: At [facility], you and your health needs are our priority.  As part of our continuing mission to provide you with exceptional heart care, we have created designated Provider Care Teams.  These Care Teams include your primary Cardiologist (physician) and Advanced Practice Providers (APPs -  Physician Assistants and Nurse Practitioners) who all work together to provide you with the care you need, when you need it.  We recommend signing up for the patient portal called "MyChart".  Sign up information is provided on this After Visit Summary.  MyChart is used to connect with patients for Virtual Visits (Telemedicine).  Patients are able to view lab/test results, encounter notes, upcoming appointments, etc.  Non-urgent messages can be sent to your provider as well.   To learn more about what you can do with MyChart, go to [email or web address].    Your next appointment:   1 year(s)  The format for your next appointment:   In Person  Provider:   Dr Mark Skains     Thank you for choosing [facility]!!    

## 2020-12-07 NOTE — Assessment & Plan Note (Signed)
Continue with aspirin 81 mg as well as atorvastatin 40 mg high intensity statin.  Overall doing well.  No chest pain reported.  She is also on low-dose bisoprolol 2.5 mg a day.  Beta-blocker.  Excellent.  Continue with daily exercise smoking cessation.  Prior nuclear stress test reassuring.

## 2020-12-07 NOTE — Assessment & Plan Note (Signed)
Continue to work with Dr. Chase Caller.  Tobacco cessation.  Medications reviewed.

## 2020-12-07 NOTE — Progress Notes (Signed)
Cardiology Office Note:    Date:  12/07/2020   ID:  Rachel Wang, DOB November 20, 1950, MRN [id number]  PCP:  Girtha Rm, PA-C   [facility] Providers Cardiologist:  None     Referring MD: Brand Males, MD    History of Present Illness:    Rachel Wang is a 70 y.o. female with COPD who ended up with a CT scan of the chest showing coronary artery calcification.  She smokes from 1 to 3 cigarettes daily.  She is also had increasing cough chest tightness shortness of breath.  She has seen Dr. Christen Butter in the past.  She had a negative nuclear stress test in December 2019.  Lower extremity vascular studies were also reassuring.   She does state that occasionally she will get a tingling/numbness like sensation on her upper thigh left side comes and goes intermittent.  Likely radiculopathy/neuropathy.  Past Medical History:  Diagnosis Date   Bronchitis    Claustrophobia    COPD (chronic obstructive pulmonary disease) ([place])    Emphysema of lung ([place])    Hyperlipidemia    Stroke ([place])    Vertigo     Past Surgical History:  Procedure Laterality Date   TUBAL LIGATION      Current Medications: Current Meds  Medication Sig   aspirin 81 MG chewable tablet Chew 81 mg by mouth daily.   atorvastatin (LIPITOR) 40 MG tablet TAKE 1 TABLET EVERY DAY   bisoprolol (ZEBETA) 5 MG tablet Take 0.5 tablets (2.5 mg total) by mouth daily.   Budeson-Glycopyrrol-Formoterol (BREZTRI AEROSPHERE) 160-9-4.8 MCG/ACT AERO Inhale 2 puffs into the lungs in the morning and at bedtime.   calcium carbonate (TUMS - DOSED IN MG ELEMENTAL CALCIUM) 500 MG chewable tablet Chew 1-2 tablets by mouth as needed for indigestion or heartburn.   doxycycline (VIBRA-TABS) 100 MG tablet Take 1 tablet (100 mg total) by mouth 2 (two) times daily.   Multiple Vitamins-Minerals (MULTIVITAMIN ADULTS 50+ PO) Take 1 tablet by mouth daily.    VITAMIN D PO Take 1,000 Int'l Units/kg/day by mouth.     Allergies:    Levofloxacin, Tramadol, Adhesive [tape], and Latex   Social History   Socioeconomic History   Marital status: Widowed    Spouse name: Not on file   Number of children: 3   Years of education: 23   Highest education level: Not on file  Occupational History   Occupation: Retired  Tobacco Use   Smoking status: Every Day    Packs/day: 0.50    Years: 51.00    Pack years: 25.50    Types: Cigarettes    Start date: 04/08/1967   Smokeless tobacco: Never   Tobacco comments:    3 cigarettes per day as of 06/07/20 ep  Vaping Use   Vaping Use: Never used  Substance and Sexual Activity   Alcohol use: No   Drug use: No   Sexual activity: Not on file  Other Topics Concern   Not on file  Social History Narrative   ** Merged History Encounter **       Fun: Dancing, cooking Denies abuse and feels safe at home.  CNA for 30 years   Social Determinants of Radio broadcast assistant Strain: Not on file  Food Insecurity: Not on file  Transportation Needs: Not on file  Physical Activity: Not on file  Stress: Not on file  Social Connections: Not on file     Family History: The patient's family history includes HIV  in her father; Heart attack in her maternal grandfather and paternal grandfather; Hypertension in her paternal grandmother; Lung cancer in her paternal aunt and sister; Stroke in her maternal grandmother and paternal grandmother.  ROS:   Please see the history of present illness.    No fevers chills nausea vomiting syncope bleeding all other systems reviewed and are negative.  EKGs/Labs/Other Studies Reviewed:    The following studies were reviewed today: CT scan of the chest 09/18/2017, comparison 11/03/2015.  Aortic atherosclerosis, atherosclerosis of the great vessels of the mediastinum and three-vessel coronary artery calcification including left main. Bronchiectasis.  Centrilobular and paraseptal emphysema.  Multiple tiny pulmonary nodules.   Exercise myoview stress  01/04/2018:  1. The patient performed treadmill exercise using Bruce protocol, completing 5:33 minutes. The patient completed an estimated workload of 7 METS, reaching 86% of the maximum predicted heart rate. Normal hemodynamic response was seen. Stress symptoms included chest tightness. Exercise capacity was low. No ischemic changes seen on stress electrocardiogram.  2. The overall quality of the study is excellent. There is no evidence of abnormal lung activity. Stress and rest SPECT images demonstrate homogeneous tracer distribution throughout the myocardium. Gated SPECT imaging reveals normal myocardial thickening and wall motion. The left ventricular ejection fraction was normal (60%).   3. Low risk study.   Abdominal Aortic Duplex  12/29/2018:  The maximum aorta (sac) diameter is 2.43 cm (prox). Mild ectasia noted in  the proximal abdominal aorta.  Mild diffuse plaque observed in the proximal, mid and distal aorta. Normal  flow velocities noted in the aorta and iliac vessels.  Consider rescreening in 10 years for AAA.  Previously reported AAA on  12/14/17 at 3 cm, appears to be an overestimation. Overall stable scan.    Lower Extremity Arterial Duplex 08/31/2019: Right profunda femoral artery >50% stenosis.  Otherwise no hemodynamically significant stenoses are identified in the  lower extremity arterial system. There is a heavily calcified plaque in the bilateral CFA and external iliac artery right more than left with <50% stenosis.  This exam reveals normal perfusion of the right lower extremity (ABI 1.00) and mildly decreased perfusion of the left lower extremity, noted at the anterior tibial and post tibial artery level (ABI 0.86) with multiphasic waveforms at ankle. Overall no significant change from  12/11/2017.   EKG:  EKG is  ordered today.  The ekg ordered today demonstrates sinus rhythm 97 nonspecific ST-T wave changes  Recent Labs: 04/02/2020: ALT 14; BUN 5; Creatinine, Ser 0.82;  Hemoglobin 14.0; Platelets 330; Potassium 3.5; Sodium 138  Recent Lipid Panel    Component Value Date/Time   CHOL 156 04/02/2020 1111   TRIG 134 04/02/2020 1111   HDL 34 (L) 04/02/2020 1111   CHOLHDL 4.6 (H) 04/02/2020 1111   CHOLHDL 4.2 07/18/2017 1730   VLDL 22 07/18/2017 1730   LDLCALC 98 04/02/2020 1111     Risk Assessment/Calculations:          Physical Exam:    VS:  BP 110/70 (BP Location: Left Arm, Patient Position: Sitting, Cuff Size: Normal)   Pulse 97   Ht 5\' 5"  (1.651 m)   Wt 190 lb (86.2 kg)   SpO2 93%   BMI 31.62 kg/m     Wt Readings from Last 3 Encounters:  12/07/20 190 lb (86.2 kg)  10/12/20 189 lb (85.7 kg)  06/07/20 191 lb 3.2 oz (86.7 kg)     GEN:  Well nourished, well developed in no acute distress HEENT: Normal NECK: No  JVD; No carotid bruits LYMPHATICS: No lymphadenopathy CARDIAC: RRR, no murmurs, rubs, gallops RESPIRATORY:  Clear to auscultation without rales, wheezing or rhonchi  ABDOMEN: Soft, non-tender, non-distended MUSCULOSKELETAL:  No edema; No deformity  SKIN: Warm and dry NEUROLOGIC:  Alert and oriented x 3 PSYCHIATRIC:  Normal affect   ASSESSMENT:    1. Coronary artery disease involving native coronary artery of native heart without angina pectoris   2. COPD, moderate ([place])   3. Tobacco abuse disorder    PLAN:    In order of problems listed above:  Coronary artery disease involving native coronary artery of native heart without angina pectoris Continue with aspirin 81 mg as well as atorvastatin 40 mg high intensity statin.  Overall doing well.  No chest pain reported.  She is also on low-dose bisoprolol 2.5 mg a day.  Beta-blocker.  Excellent.  Continue with daily exercise smoking cessation.  Prior nuclear stress test reassuring.  COPD, moderate ([place]) Continue to work with Dr. Chase Caller.  Tobacco cessation.  Medications reviewed.  Tobacco abuse disorder Continue encouraged complete cessation of smoking.   We will see  her back in 1 year.      Medication Adjustments/Labs and Tests Ordered: Current medicines are reviewed at length with the patient today.  Concerns regarding medicines are outlined above.  Orders Placed This Encounter  Procedures   EKG 12-Lead    No orders of the defined types were placed in this encounter.   Patient Instructions  Medication Instructions:  The current medical regimen is effective;  continue present plan and medications.  *If you need a refill on your cardiac medications before your next appointment, please call your pharmacy*  Follow-Up: At [facility], you and your health needs are our priority.  As part of our continuing mission to provide you with exceptional heart care, we have created designated Provider Care Teams.  These Care Teams include your primary Cardiologist (physician) and Advanced Practice Providers (APPs -  Physician Assistants and Nurse Practitioners) who all work together to provide you with the care you need, when you need it.  We recommend signing up for the patient portal called "MyChart".  Sign up information is provided on this After Visit Summary.  MyChart is used to connect with patients for Virtual Visits (Telemedicine).  Patients are able to view lab/test results, encounter notes, upcoming appointments, etc.  Non-urgent messages can be sent to your provider as well.   To learn more about what you can do with MyChart, go to [email or web address].    Your next appointment:   1 year(s)  The format for your next appointment:   In Person  Provider:   Dr Candee Furbish  Thank you for choosing [facility]!!     Signed, Candee Furbish, MD  12/07/2020 4:42 PM    [place]

## 2020-12-07 NOTE — Assessment & Plan Note (Signed)
Continue encouraged complete cessation of smoking.

## 2020-12-14 ENCOUNTER — Telehealth: Payer: Self-pay | Admitting: Internal Medicine

## 2020-12-14 MED ORDER — BREZTRI AEROSPHERE 160-9-4.8 MCG/ACT IN AERO: 2.0000 | INHALATION_SPRAY | Freq: Two times a day (BID) | RESPIRATORY_TRACT | 6 refills | Status: DC

## 2020-12-14 NOTE — Telephone Encounter (Signed)
I have called the pt and Lm on VM to make her aware of rx that has been sent to the pharmacy.

## 2021-01-11 ENCOUNTER — Telehealth: Payer: Self-pay | Admitting: Primary Care

## 2021-01-11 ENCOUNTER — Other Ambulatory Visit: Payer: Self-pay

## 2021-01-11 ENCOUNTER — Ambulatory Visit: Payer: Medicare HMO | Admitting: Primary Care

## 2021-01-11 ENCOUNTER — Encounter: Payer: Self-pay | Admitting: Primary Care

## 2021-01-11 DIAGNOSIS — R6889 Other general symptoms and signs: Secondary | ICD-10-CM | POA: Diagnosis not present

## 2021-01-11 DIAGNOSIS — J441 Chronic obstructive pulmonary disease with (acute) exacerbation: Secondary | ICD-10-CM | POA: Diagnosis not present

## 2021-01-11 MED ORDER — DOXYCYCLINE HYCLATE 100 MG PO TABS: 100.0000 mg | ORAL_TABLET | Freq: Two times a day (BID) | ORAL | 0 refills | Status: DC

## 2021-01-11 MED ORDER — PREDNISONE 10 MG PO TABS: ORAL_TABLET | ORAL | 0 refills | Status: DC

## 2021-01-11 MED ORDER — ALBUTEROL SULFATE HFA 108 (90 BASE) MCG/ACT IN AERS: 2.0000 | INHALATION_SPRAY | Freq: Four times a day (QID) | RESPIRATORY_TRACT | 6 refills | Status: DC | PRN

## 2021-01-11 MED ORDER — STIOLTO RESPIMAT 2.5-2.5 MCG/ACT IN AERS: 2.0000 | INHALATION_SPRAY | Freq: Every day | RESPIRATORY_TRACT | 0 refills | Status: DC

## 2021-01-11 NOTE — Progress Notes (Signed)
@Patient  ID: Rachel Wang, female    DOB: 20-Jan-1951, 70 y.o.   MRN: [id number]  Chief Complaint  Patient presents with   Follow-up    Judithann Sauger worked well, but is too expensive until January.      Referring provider: Davy Pique  HPI: 70 year old female, current every day smoker. PMH significant for COPD, centrilobular emphysema, CAD, GERD, hyperlipidemia. Patient of Dr. Chase Caller, last seen on 10/12/20.   01/11/2021 Patient presents today for 3 month follow-up. During last visit she was given sample of Breztri, she has been without maintenance inhaler for the last three week as she can not afford prescription. Patient developed productive cough last week, she is getting up green mucus. She prefers trelegy over breztri. Breztri worked ok but she still had wheezing and she was not always able to remember to use it twice daily. She does not have a rescue inhaler available.     Allergies  Allergen Reactions   Levofloxacin Nausea And Vomiting   Tramadol Nausea And Vomiting   Adhesive [Tape] Rash and Other (See Comments)    WELTS, also   Latex Rash and Other (See Comments)    WELTS, also    Immunization History  Administered Date(s) Administered   Pneumococcal Conjugate-13 04/03/2017   Pneumococcal Polysaccharide-23 11/15/2018    Past Medical History:  Diagnosis Date   Bronchitis    Claustrophobia    COPD (chronic obstructive pulmonary disease) ([place])    Emphysema of lung ([place])    Hyperlipidemia    Stroke ([place])    Vertigo     Tobacco History: Social History   Tobacco Use  Smoking Status Every Day   Packs/day: 0.50   Years: 51.00   Pack years: 25.50   Types: Cigarettes   Start date: 04/08/1967  Smokeless Tobacco Never  Tobacco Comments   2 cigs per day 01/11/21 br   Ready to quit: Not Answered Counseling given: Not Answered Tobacco comments: 2 cigs per day 01/11/21 br   Outpatient Medications Prior to Visit  Medication Sig Dispense Refill    aspirin 81 MG chewable tablet Chew 81 mg by mouth daily.     atorvastatin (LIPITOR) 40 MG tablet TAKE 1 TABLET EVERY DAY 90 tablet 1   bisoprolol (ZEBETA) 5 MG tablet Take 0.5 tablets (2.5 mg total) by mouth daily. 90 tablet 1   calcium carbonate (TUMS - DOSED IN MG ELEMENTAL CALCIUM) 500 MG chewable tablet Chew 1-2 tablets by mouth as needed for indigestion or heartburn.     Multiple Vitamins-Minerals (MULTIVITAMIN ADULTS 50+ PO) Take 1 tablet by mouth daily.      VITAMIN D PO Take 1,000 Int'l Units/kg/day by mouth.     Budeson-Glycopyrrol-Formoterol (BREZTRI AEROSPHERE) 160-9-4.8 MCG/ACT AERO Inhale 2 puffs into the lungs in the morning and at bedtime. 10.7 g 6   doxycycline (VIBRA-TABS) 100 MG tablet Take 1 tablet (100 mg total) by mouth 2 (two) times daily. 10 tablet 0   No facility-administered medications prior to visit.    Review of Systems  Review of Systems  Constitutional: Negative.   HENT:  Positive for congestion.   Respiratory:  Positive for cough.     Physical Exam  BP 116/70 (BP Location: Right Arm, Cuff Size: Normal)    Pulse 82    Ht 5\' 5"  (1.651 m)    Wt 191 lb 6.4 oz (86.8 kg)    SpO2 95%    BMI 31.85 kg/m  Physical Exam Constitutional:  Appearance: Normal appearance.  HENT:     Head: Normocephalic and atraumatic.  Cardiovascular:     Rate and Rhythm: Normal rate and regular rhythm.  Pulmonary:     Effort: Pulmonary effort is normal.     Breath sounds: Rhonchi present.  Musculoskeletal:        General: Normal range of motion.  Skin:    General: Skin is warm and dry.  Neurological:     General: No focal deficit present.     Mental Status: She is alert and oriented to person, place, and time. Mental status is at baseline.  Psychiatric:        Mood and Affect: Mood normal.        Behavior: Behavior normal.        Thought Content: Thought content normal.        Judgment: Judgment normal.     Lab Results:  CBC    Component Value Date/Time   WBC  6.7 04/02/2020 1111   WBC 9.2 07/19/2017 0505   RBC 4.77 04/02/2020 1111   RBC 4.50 07/19/2017 0505   HGB 14.0 04/02/2020 1111   HCT 41.7 04/02/2020 1111   PLT 330 04/02/2020 1111   MCV 87 04/02/2020 1111   MCH 29.4 04/02/2020 1111   MCH 28.9 07/19/2017 0505   MCHC 33.6 04/02/2020 1111   MCHC 31.9 07/19/2017 0505   RDW 14.2 04/02/2020 1111   LYMPHSABS 3.1 04/02/2020 1111   MONOABS 0.4 07/18/2017 1420   EOSABS 0.3 04/02/2020 1111   BASOSABS 0.1 04/02/2020 1111    BMET    Component Value Date/Time   NA 138 04/02/2020 1111   K 3.5 04/02/2020 1111   CL 100 04/02/2020 1111   CO2 23 04/02/2020 1111   GLUCOSE 102 (H) 04/02/2020 1111   GLUCOSE 158 (H) 07/19/2017 0505   BUN 5 (L) 04/02/2020 1111   CREATININE 0.82 04/02/2020 1111   CALCIUM 10.0 04/02/2020 1111   GFRNONAA >60 07/19/2017 0505   GFRAA >60 07/19/2017 0505    BNP No results found for: BNP  ProBNP No results found for: PROBNP  Imaging: No results found.   Assessment & Plan:   COPD exacerbation ([place]) - Seen today for routine 3 month follow-up/ She has been off maintenance inhaler for three weeks d/t cost of medication. She prefers Trelegy over [place] but she is currently in the donut hole. We do not have any samples to give her today except for Stiolto Respimat (LABA/LAMA). Sending in RX doxycycline 100mg  BID x 7 days and prednisone taper. We will resume Trelegy start of the new year.  FU in 3 months.      Martyn Ehrich, NP 01/11/2021

## 2021-01-11 NOTE — Assessment & Plan Note (Signed)
-   Seen today for routine 3 month follow-up/ She has been off maintenance inhaler for three weeks d/t cost of medication. She prefers Trelegy over [place] but she is currently in the donut hole. We do not have any samples to give her today except for Stiolto Respimat (LABA/LAMA). Sending in RX doxycycline 100mg  BID x 7 days and prednisone taper. We will resume Trelegy start of the new year.  FU in 3 months.

## 2021-01-11 NOTE — Telephone Encounter (Signed)
Can you do benefits investigation for either Trelegy or [employer]

## 2021-01-11 NOTE — Patient Instructions (Addendum)
Recommendations: - Start Stiolto respimat- take two puffs daily in the morning (use these samples until we can get you back on Trelegy or Breztri come January 2023) - Use albuterol rescue inhaler 2 puffs every 4-6 hours as needed for breakthrough shortness of breath/wheezing  Rx: - Doxycycline 100mg  twice a day x 7 days - Prednisone taper   Follow-up: - 3 months with Dr. Chase Caller or Eustaquio Maize NP

## 2021-01-14 ENCOUNTER — Other Ambulatory Visit (HOSPITAL_COMMUNITY): Payer: Self-pay

## 2021-01-14 NOTE — Telephone Encounter (Signed)
Both inhalers have patient assistance.  Breztri - through [place] - income documents not required  Trelegy - through [place] - must submit income documents and pay $600 out of pocket for rx during calendar year of enrollment to qualify for program and would only be approved through 01/26/21.  If patient wants to pursue patient assistance, would recommend Breztri since she will be enrolled for entire year and not just through 01/26/21.  Knox Saliva, PharmD, MPH, BCPS Clinical Pharmacist (Rheumatology and Pulmonology)

## 2021-01-18 NOTE — Telephone Encounter (Signed)
Can we help patient apply for patient assistance for Trelegy 152mcg. No rush, I gave her stiolto which should help last through the end of the year.

## 2021-01-22 NOTE — Telephone Encounter (Signed)
Called and spoke with pt letting her know info per [facility] that we are going to have her fill out pt assistance paperwork for Trelegy to see if she could be approved and she verbalized understanding. Paperwork has been placed in the mail for pt. Nothing further needed.

## 2021-02-04 ENCOUNTER — Ambulatory Visit
Admission: RE | Admit: 2021-02-04 | Discharge: 2021-02-04 | Disposition: A | Discharge status: Home / Self Care | Payer: Medicare HMO | Source: Ambulatory Visit | Attending: Internal Medicine | Admitting: Internal Medicine

## 2021-02-04 ENCOUNTER — Other Ambulatory Visit: Payer: Self-pay | Admitting: Internal Medicine

## 2021-02-04 DIAGNOSIS — M25571 Pain in right ankle and joints of right foot: Secondary | ICD-10-CM

## 2021-02-06 ENCOUNTER — Telehealth: Payer: Self-pay | Admitting: Family Medicine

## 2021-02-06 NOTE — Telephone Encounter (Signed)
Left message for patient to call back and schedule Medicare Annual Wellness Visit (AWV) either virtually or in office. I left my number for patient to call [phone].  Last AWV 02/20/16  please schedule at anytime with health coach  This should be a 45 minute visit.

## 2021-02-08 ENCOUNTER — Other Ambulatory Visit: Payer: Self-pay | Admitting: Cardiology

## 2021-03-06 ENCOUNTER — Telehealth: Payer: Self-pay | Admitting: Family Medicine

## 2021-03-06 NOTE — Telephone Encounter (Signed)
I spoke with patient to schedule AWV.  She stated she changed providers.  She sees [facility] on [address].

## 2021-03-06 NOTE — Telephone Encounter (Signed)
Removed pcp

## 2021-04-15 ENCOUNTER — Ambulatory Visit: Payer: Medicare HMO | Admitting: Internal Medicine

## 2021-04-29 NOTE — Progress Notes (Signed)
? ? ? ? ?OV 09/02/2017 ? ?Chief Complaint  ?Patient presents with  ? Follow-up  ?  PFT performed 08/31/17.  Pt states she has been doing well since last visit. States she still becomes SOB with exertion.  ? ? ? ?Follow-up Gold stage II COPD withongoing smoking and on triple inhaler therapy. She continues to do well. No interim complaints or exacerbations. COPD cat score is 33 and she is significant amount of symptoms. Although she does feel stable and better. A CT scan of the chest was in 2017 without any cancer. She continues to smoke unable to quit. There are no new issues. She says she cannot do pulmonary rehabilitation because she has to visit her husband was in a nursing home of bilateral strokes. She is happy with her triple inhaler therapy. But on deeper questioning she does admit out of proportion shortness of breath. In fact rated level V out of 5. ? ? ? ?OV 10/12/2019 ? ? ?Subjective:  ?Patient ID: Rachel Wang, female , DOB: 17-Jun-1950, age 71 y.o. years. , MRN: [id number],  ADDRESS: [address] ?[place] [address] ?PCP  Patient, No Pcp Per ?Providers : Treatment Team:  ?Attending Provider: Brand Males, MD ? ? ?Chief Complaint  ?Patient presents with  ? Follow-up  ?  green phlem in throat, runs course for a week.  Not this anymore, more clear than green.  ? ? ? ? ?CAT COPD Symptom & Quality of Life Score ([employer] trademark) ?0 is no burden. 5 is highest burden 09/02/2017 ?  ?Never Cough -> Cough all the time 4  ?No phlegm in chest -> Chest is full of phlegm 4  ?No chest tightness -> Chest feels very tight 3  ?No dyspnea for 1 flight stairs/hill -> Very dyspneic for 1 flight of stairs 5  ?No limitations for ADL at home -> Very limited with ADL at home 4  ?Confident leaving home -> Not at all confident leaving home 4  ?Sleep soundly -> Do not sleep soundly because of lung condition 5  ?Lots of Energy -> No energy at all 4  ?TOTAL Score (max 40)  33  ? ? ? ? ? ? ? ?Results for Rachel, Wang (MRN  [id number]) as of 09/02/2017 11:43 ? Ref. Range 12/28/2015 13:45 08/31/2017 14:23  ?FEV1-Post Latest Units: L 1.30 1.31  ?FEV1-%Pred-Post Latest Units: % 63 65  ? ?Results for Rachel, Wang (MRN [id number]) as of 09/02/2017 11:43 ? Ref. Range 12/28/2015 13:45 08/31/2017 14:23  ?DLCO unc Latest Units: ml/min/mmHg 9.64 13.80  ?DLCO unc % pred Latest Units: % 37 53  ? ? ? ? ?OV 06/07/2020 ? ?Subjective:  ?Patient ID: Rachel Wang, female , DOB: Jun 20, 1950 , age 58 y.o. , MRN: [id number] , ADDRESS: [address] ?[place] [address] ?PCP Girtha Rm, NP-C ?Patient Care Team: ?Girtha Rm, NP-C as PCP - General (Family Medicine) ?Adrian Prows, MD as Consulting Physician (Cardiology) ? ?This Provider for this visit: Treatment Team:  ?Attending Provider: Brand Males, MD ? ? ? ?06/07/2020 -   ?Chief Complaint  ?Patient presents with  ? Follow-up  ?  Pt had recent CT performed and is here today to discuss the results.  Pt states seh has been doing okay since last visit and denies any real complaints.  ? ?Follow-up Gold stage II COPD ?Follow-up smoking ? ?HPI ?Rachel Wang 71 y.o. -returns for follow-up.  She says she still smokes and has cut down her smoking though.  She smokes anywhere  from 1 to 3 cigarettes daily.  She does not want medication help to quit smoking.  Has COPD stable on Trelegy.  She had CT scan of the chest personally visualized.  No change from 2019.  She does not have any chest pains.  She continues to be reluctant about taking vaccines stable disease ? ? ? ? ?CT Chest data march 2022 ? ? ?IMPRESSION: ?1. No significant change from CT 09/18/2017. ?2. Bronchiectasis and subpleural cystic change similar to comparison ?exam. ?3. Stable small pulmonary nodules. ?4. Coronary artery calcification and Aortic Atherosclerosis ?(ICD10-I70.0). ?  ?  ?Electronically Signed ?  By: Suzy Bouchard M.D. ?  On: 04/11/2020 10:29 ? ?No results found. ? ? ? ? ?OV 10/12/2020 ? ?Subjective:  ?Patient ID: Rachel Wang, female , DOB: [birth year] , age 64 y.o. , MRN: [id number] , ADDRESS: [address] ?[place] [address] ?PCP Girtha Rm, NP-C ?Patient Care Team: ?Girtha Rm, NP-C as PCP - General (Family Medicine) ?Adrian Prows, MD as Consulting Physician (Cardiology) ? ?This Provider for this visit: Treatment Team:  ?Attending Provider: Brand Males, MD ? ? ? ?Follow-up Gold stage II COPD ?Follow-up smoking ? ?10/12/2020 -   ?Chief Complaint  ?Patient presents with  ? Follow-up  ?  Patient reports that she has not had trelegy x 1 month, She reports worse since she has not been able to get on this medication.   ? ? ? ?HPI ?Rachel Wang 70 y.o. - ? ?Advanced COPD ?Ongoing smoking ?Vaccine hesitancy ?Coronary artery calcification ?Cancer screening last CT scan March 2022 without nodules ? ? ?HPI ?Rachel Wang 71 y.o. -returns for follow-up.  She tells me that in August 2022 she ran out of her Trelegy because of cost issues.  The co-pay for 3 months went up from $150 plus to $400 plus.  She did not call our office because she did not know we could help.  She thinks that the donut hole issue.  She is not aware of any insurance plan change.  Then in the last few to several week she is having increasing cough, chest tightness, shortness of breath and green sputum.  She feels it is the worst she has been but she does not think she needs an admission.  She is cut down on her smoking.  Last visit I referred her to cardiology but apparently the referral did not go through.  She is willing to look at a repeat referral. ? ? ?CAT Score 10/12/2020 03/02/2017  ?Total CAT Score 22 36  ? ? ? ? ?01/11/2021 - app visit  ? ? ?71 year old female, current every day smoker. PMH significant for COPD, centrilobular emphysema, CAD, GERD, hyperlipidemia. Patient of Dr. Chase Caller, last seen on 10/12/20.  ? ? ?Patient presents today for 3 month follow-up. During last visit she was given sample of Breztri, she has been without  maintenance inhaler for the last three week as she can not afford prescription. Patient developed productive cough last week, she is getting up green mucus. She prefers trelegy over breztri. Breztri worked ok but she still had wheezing and she was not always able to remember to use it twice daily. She does not have a rescue inhaler available.  ? ? ? ?OV 05/05/2021 ? ?Subjective:  ?Patient ID: Rachel Wang, female , DOB: [birth year] , age 35 y.o. , MRN: [id number] , ADDRESS: [address] ?[place] [address] ?PCP No primary care provider on file. ?Patient Care Team: ?Adrian Prows,  MD as Consulting Physician (Cardiology) ? ?This Provider for this visit: Treatment Team:  ?Attending Provider: Brand Males, MD ? ? ? ?05/05/2021 -chest ?Chief Complaint  ?Patient presents with  ? Follow-up  ?  Pt states she has been okay since last visit.  ? ? ?Advanced COPD ?Ongoing smoking ?Vaccine hesitancy ?Coronary artery calcification ? - neg nucl stress test 2019 ? - Dr Marlou Porch Nov 2022 ?Cancer screening last CT scan March 2022 without nodules ? - ? ?HPI ?Rachel Wang 71 y.o. -followup visit. Overall feels pretty good. Still smokes. Dealing with insomnia. Appetitie is so s and poor . On trelegy. Last prednisone with APP x 2  in 2022. Cut down smoking to  2 cig in  2 weeks. Did not trake trelegy 1- 3 days. Last CT March 2022 ? ? ? ?CT Chest data ? ?No results found. ? ? ? ?PFT ? ? ?  Latest Ref Rng & Units 08/31/2017  ?  3:52 PM 12/28/2015  ?  1:45 PM  ?PFT Results  ?FVC-Pre L 1.51   1.66    ?FVC-Predicted Pre % 58   63    ?FVC-Post L 1.54   1.68    ?FVC-Predicted Post % 59   63    ?Pre FEV1/FVC % % 86   75    ?Post FEV1/FCV % % 85   78    ?FEV1-Pre L 1.29   1.24    ?FEV1-Predicted Pre % 64   60    ?FEV1-Post L 1.31   1.30    ?DLCO uncorrected ml/min/mmHg 13.80   9.64    ?DLCO UNC% % 53   37    ?DLCO corrected ml/min/mmHg 13.98     ?DLCO COR %Predicted % 54     ?DLVA Predicted % 95   78    ?TLC L  3.52    ?TLC % Predicted %  67     ?RV % Predicted %  89    ? ? ? ? ? has a past medical history of Bronchitis, Claustrophobia, COPD (chronic obstructive pulmonary disease) ([place]), Emphysema of lung ([place]), Hyperlipidemia, Stroke ([place]), and Vertigo. ?

## 2021-04-30 ENCOUNTER — Encounter: Payer: Self-pay | Admitting: Internal Medicine

## 2021-04-30 ENCOUNTER — Ambulatory Visit: Payer: Medicare HMO | Admitting: Internal Medicine

## 2021-04-30 VITALS — BP 126/80 | HR 84 | Temp 98.1°F | Ht 65.0 in | Wt 189.2 lb

## 2021-04-30 DIAGNOSIS — J449 Chronic obstructive pulmonary disease, unspecified: Secondary | ICD-10-CM | POA: Diagnosis not present

## 2021-04-30 DIAGNOSIS — Z87891 Personal history of nicotine dependence: Secondary | ICD-10-CM

## 2021-04-30 NOTE — Patient Instructions (Addendum)
COPD, moderate ([place]) ?Copd exacerbation ? ?- recurrent flare up but now stable  ?- some wheezing 04/30/2021 on exam due to not taking trelegy 04/30/2021 ? ?Plan ?- continue trelegy daily - be diligent ?- use albuterol as needed ?- consider ARNASA study - when details available can share ? ?History of smoking 25-50 pack years ? ? -Glad you are working on quitting and have reduce the amount of smoke to 2 cig/2 weeks ? ?Plan ?- Continue work on reducing smoking and ultimately quit ? ?Cancer screening ? ?- CT chest wo contrast in 3 months (last March 2022) ? ? ?Follow-up ?- Return to see Dr Chase Caller in 3 months or sooner if needed but after CT scan ? - CAT score at followup ? ?

## 2021-05-15 ENCOUNTER — Other Ambulatory Visit: Payer: Self-pay | Admitting: Family Medicine

## 2021-05-15 DIAGNOSIS — I739 Peripheral vascular disease, unspecified: Secondary | ICD-10-CM

## 2021-05-15 DIAGNOSIS — M79661 Pain in right lower leg: Secondary | ICD-10-CM

## 2021-05-16 ENCOUNTER — Other Ambulatory Visit: Payer: Self-pay | Admitting: Family Medicine

## 2021-05-16 DIAGNOSIS — M79661 Pain in right lower leg: Secondary | ICD-10-CM

## 2021-05-16 DIAGNOSIS — I739 Peripheral vascular disease, unspecified: Secondary | ICD-10-CM

## 2021-05-24 ENCOUNTER — Ambulatory Visit
Admission: RE | Admit: 2021-05-24 | Discharge: 2021-05-24 | Disposition: A | Discharge status: Home / Self Care | Payer: Medicare HMO | Source: Ambulatory Visit | Attending: Family Medicine | Admitting: Family Medicine

## 2021-05-24 DIAGNOSIS — M79661 Pain in right lower leg: Secondary | ICD-10-CM

## 2021-05-30 ENCOUNTER — Ambulatory Visit
Admission: RE | Admit: 2021-05-30 | Discharge: 2021-05-30 | Disposition: A | Discharge status: Home / Self Care | Payer: Medicare HMO | Source: Ambulatory Visit | Attending: Family Medicine | Admitting: Family Medicine

## 2021-05-30 DIAGNOSIS — I739 Peripheral vascular disease, unspecified: Secondary | ICD-10-CM

## 2021-06-18 ENCOUNTER — Ambulatory Visit
Admission: RE | Admit: 2021-06-18 | Discharge: 2021-06-18 | Disposition: A | Discharge status: Home / Self Care | Payer: Medicare HMO | Source: Ambulatory Visit | Attending: Family Medicine | Admitting: Family Medicine

## 2021-06-18 ENCOUNTER — Other Ambulatory Visit: Payer: Self-pay | Admitting: Family Medicine

## 2021-06-18 DIAGNOSIS — M25561 Pain in right knee: Secondary | ICD-10-CM

## 2021-08-13 ENCOUNTER — Encounter: Payer: Self-pay | Admitting: Internal Medicine

## 2021-08-13 ENCOUNTER — Ambulatory Visit: Payer: Medicare HMO | Admitting: Internal Medicine

## 2021-08-13 VITALS — BP 136/68 | HR 73 | Temp 98.8°F | Ht 65.0 in | Wt 190.2 lb

## 2021-08-13 DIAGNOSIS — Z87891 Personal history of nicotine dependence: Secondary | ICD-10-CM

## 2021-08-13 DIAGNOSIS — Z129 Encounter for screening for malignant neoplasm, site unspecified: Secondary | ICD-10-CM | POA: Diagnosis not present

## 2021-08-13 DIAGNOSIS — R6884 Jaw pain: Secondary | ICD-10-CM

## 2021-08-13 DIAGNOSIS — J449 Chronic obstructive pulmonary disease, unspecified: Secondary | ICD-10-CM

## 2021-08-13 LAB — TROPONIN I (HIGH SENSITIVITY): High Sens Troponin I: 4 ng/L (ref 2–17)

## 2021-08-13 NOTE — Progress Notes (Signed)
OV 09/02/2017  Chief Complaint  Patient presents with   Follow-up    PFT performed 08/31/17.  Pt states she has been doing well since last visit. States she still becomes SOB with exertion.     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 She continues to do well. No interim complaints or exacerbations. COPD cat score is 33 and she is significant amount of symptoms. Although she does feel stable and better. A CT scan of the chest was in 2017 without any cancer. She continues to smoke unable to quit. There are no new issues. She says she cannot do pulmonary rehabilitation because she has to visit her husband was in a nursing home of bilateral strokes. She is happy with her triple inhaler therapy. But on deeper questioning she does admit out of proportion shortness of breath. In fact rated level V out of 5.    OV 10/12/2019   Subjective:  Patient ID: Rachel Wang, female , DOB: [birth year], age 57 y.o. years. , MRN: [id number],  ADDRESS: [place] [address] PCP  Patient, No Pcp Per Providers : Treatment Team:  Attending Provider: Brand Males, MD   Chief Complaint  Patient presents with   Follow-up    green phlem in throat, runs course for a week.  Not this anymore, more clear than green.      CAT COPD Symptom & Quality of Life Score (GSK trademark) 0 is no burden. 5 is highest burden 09/02/2017   Never Cough -> Cough all the time 4  No phlegm in chest -> Chest is full of phlegm 4  No chest tightness -> Chest feels very tight 3  No dyspnea for 1 flight stairs/hill -> Very dyspneic for 1 flight of stairs 5  No limitations for ADL at home -> Very limited with ADL at home 4  Confident leaving home -> Not at all confident leaving home 4  Sleep soundly -> Do not sleep soundly because of lung condition 5  Lots of Energy -> No energy at all 4  TOTAL Score (max 40)  33         Results for AVANA, KREISER (MRN  [id number]) as of 09/02/2017 11:43  Ref. Range 12/28/2015 13:45 08/31/2017 14:23  FEV1-Post Latest Units: L 1.30 1.31  FEV1-%Pred-Post Latest Units: % 63 65   Results for MAKENLI, DERSTINE (MRN [id number]) as of 09/02/2017 11:43  Ref. Range 12/28/2015 13:45 08/31/2017 14:23  DLCO unc Latest Units: ml/min/mmHg 9.64 13.80  DLCO unc % pred Latest Units: % 37 53      OV 06/07/2020  Subjective:  Patient ID: Rachel Wang, female , DOB: 08/29/1950 , age 25 y.o. , MRN: [id number] , ADDRESS: [place] [address] PCP Girtha Rm, NP-C Patient Care Team: Girtha Rm, NP-C as PCP - General (Family Medicine) Adrian Prows, MD as Consulting Physician (Cardiology)  This Provider for this visit: Treatment Team:  Attending Provider: Brand Males, MD    06/07/2020 -   Chief Complaint  Patient presents with   Follow-up    Pt had recent CT performed and is here today to discuss the results.  Pt states seh has been doing okay since last visit and denies any real complaints.   Follow-up Gold stage II COPD Follow-up smoking  HPI Rachel Wang 71 y.o. -returns for follow-up.  She says she still smokes and has cut down her smoking though.  She smokes anywhere  from 1 to 3 cigarettes daily.  She does not want medication help to quit smoking.  Has COPD stable on Trelegy.  She had CT scan of the chest personally visualized.  No change from 2019.  She does not have any chest pains.  She continues to be reluctant about taking vaccines stable disease     CT Chest data march 2022   IMPRESSION: 1. No significant change from CT 09/18/2017. 2. Bronchiectasis and subpleural cystic change similar to comparison exam. 3. Stable small pulmonary nodules. 4. Coronary artery calcification and Aortic Atherosclerosis (ICD10-I70.0).     Electronically Signed   By: Suzy Bouchard M.D.   On: 04/11/2020 10:29  No results found.     OV 10/12/2020  Subjective:  Patient ID: Rachel Wang, female , DOB: [birth year] , age 35 y.o. , MRN: [id number] , ADDRESS: [place] [address] PCP Girtha Rm, NP-C Patient Care Team: Girtha Rm, NP-C as PCP - General (Family Medicine) Adrian Prows, MD as Consulting Physician (Cardiology)  This Provider for this visit: Treatment Team:  Attending Provider: Brand Males, MD    Follow-up Gold stage II COPD Follow-up smoking  10/12/2020 -   Chief Complaint  Patient presents with   Follow-up    Patient reports that she has not had trelegy x 1 month, She reports worse since she has not been able to get on this medication.      HPI Mamye Bolds 71 y.o. -  Advanced COPD Ongoing smoking Vaccine hesitancy Coronary artery calcification Cancer screening last CT scan March 2022 without nodules   HPI Kymberlee Viger 71 y.o. -returns for follow-up.  She tells me that in August 2022 she ran out of her Trelegy because of cost issues.  The co-pay for 3 months went up from $150 plus to $400 plus.  She did not call our office because she did not know we could help.  She thinks that the donut hole issue.  She is not aware of any insurance plan change.  Then in the last few to several week she is having increasing cough, chest tightness, shortness of breath and green sputum.  She feels it is the worst she has been but she does not think she needs an admission.  She is cut down on her smoking.  Last visit I referred her to cardiology but apparently the referral did not go through.  She is willing to look at a repeat referral.   CAT Score 10/12/2020 03/02/2017  Total CAT Score 22 36      01/11/2021 - app visit    71 year old female, current every day smoker. PMH significant for COPD, centrilobular emphysema, CAD, GERD, hyperlipidemia. Patient of Dr. Chase Caller, last seen on 10/12/20.    Patient presents today for 3 month follow-up. During last visit she was given sample of Breztri, she has been without  maintenance inhaler for the last three week as she can not afford prescription. Patient developed productive cough last week, she is getting up green mucus. She prefers trelegy over breztri. Breztri worked ok but she still had wheezing and she was not always able to remember to use it twice daily. She does not have a rescue inhaler available.     OV 05/05/2021  Subjective:  Patient ID: Rachel Wang, female , DOB: 09/09/1950 , age 64 y.o. , MRN: [id number] , ADDRESS: [place]  [address] PCP No primary care provider on file. Patient Care Team: Adrian Prows,  MD as Consulting Physician (Cardiology)  This Provider for this visit: Treatment Team:  Attending Provider: Brand Males, MD    05/05/2021 -chest Chief Complaint  Patient presents with   Follow-up    Pt states she has been okay since last visit.    Advanced COPD Ongoing smoking Vaccine hesitancy Coronary artery calcification  - neg nucl stress test 2019  - Dr Marlou Porch Nov 2022 Cancer screening last CT scan March 2022 without nodules  -  HPI Darrelyn Morro 71 y.o. -followup visit. Overall feels pretty good. Still smokes. Dealing with insomnia. Appetitie is so s and poor . On trelegy. Last prednisone with APP x 2  in 2022. Cut down smoking to  2 cig in  2 weeks. Did not trake trelegy 1- 3 days. Last CT March 2022    CT Chest data  No results found.     OV 08/13/2021  Subjective:  Patient ID: Rachel Wang, female , DOB: 1951-01-08 , age 71 y.o. , MRN: [id number] , ADDRESS: [id number] [place] [id number] PCP Margretta Sidle, MD Patient Care Team: Margretta Sidle, MD as PCP - General Adrian Prows, MD as Consulting Physician (Cardiology)  This Provider for this visit: Treatment Team:  Attending Provider: Brand Males, MD   Advanced COPD -Nighttime oxygen as needed -Trelegy Ongoing smoking Vaccine hesitancy Coronary artery calcification  - neg nucl stress test 2019  - Dr Marlou Porch  Nov 2022 Cancer screening last CT scan March 2022 without nodules  -  08/13/2021 -   Chief Complaint  Patient presents with   Follow-up    COPD follow up. Pt states that her breathing is doing well. She states she had a sharp pain that is in her jaw and left arm pain      HPI Rachel Wang 71 y.o. -returns for follow-up.  Since her last visit no further exacerbations.  She continues Trelegy.  She feels her respiratory status is good.  Yesterday the air quality was poor and there was dense humidity and she did have some cough but that is resolved.  She is changing DME companies.  She wants renewal on her nighttime oxygen which she uses occasionally.  She also wants order for portable oxygen.  I did indicate to her that she will require retesting.  She is okay with this.  In terms of smoking she cut down to 1 cigarette/day within 2 weeks ago her son who lives in [place] had a cardiac arrest but he is now survived.  Therefore she is gone up on her smoking.  Last visit I ordered a low-dose CT scan of the chest but this has not been done.  I do not know why.  She is willing to get this done.  Of note yesterday she developed some left neck pain that went into her occiput.  Today she has the same but also started developing tingling around the left jaw and then also paresthesias in the left arm.  We did an EKG I personally visualized it and it is normal.    CT Chest data  No results found.    PFT     Latest Ref Rng & Units 08/31/2017    3:52 PM 12/28/2015    1:45 PM  PFT Results  FVC-Pre L 1.51  1.66   FVC-Predicted Pre % 58  63   FVC-Post L 1.54  1.68   FVC-Predicted Post % 59  63   Pre FEV1/FVC % % 86  75   Post FEV1/FCV % %  85  78   FEV1-Pre L 1.29  1.24   FEV1-Predicted Pre % 64  60   FEV1-Post L 1.31  1.30   DLCO uncorrected ml/min/mmHg 13.80  9.64   DLCO UNC% % 53  37   DLCO corrected ml/min/mmHg 13.98    DLCO COR %Predicted % 54    DLVA Predicted % 95  78   TLC L  3.52    TLC % Predicted %  67   RV % Predicted %  89        has a past medical history of Bronchitis, Claustrophobia, COPD (chronic obstructive pulmonary disease) ([place]), Emphysema of lung ([place]), Hyperlipidemia, Stroke ([place]), and Vertigo.   reports that she has been smoking cigarettes. She started smoking about 54 years ago. She has a 25.50 pack-year smoking history. She has never used smokeless tobacco.  Past Surgical History:  Procedure Laterality Date   TUBAL LIGATION      Allergies  Allergen Reactions   Levofloxacin Nausea And Vomiting   Tramadol Nausea And Vomiting   Adhesive [Tape] Rash and Other (See Comments)    WELTS, also   Latex Rash and Other (See Comments)    WELTS, also    Immunization History  Administered Date(s) Administered   Pneumococcal Conjugate-13 04/03/2017   Pneumococcal Polysaccharide-23 11/15/2018    Family History  Problem Relation Age of Onset   HIV Father    Lung cancer Sister    Lung cancer Paternal Aunt    Stroke Maternal Grandmother    Heart attack Maternal Grandfather    Stroke Paternal Grandmother    Hypertension Paternal Grandmother    Heart attack Paternal Grandfather      Current Outpatient Medications:    albuterol (VENTOLIN HFA) 108 (90 Base) MCG/ACT inhaler, Inhale 2 puffs into the lungs every 6 (six) hours as needed for wheezing or shortness of breath., Disp: 8 g, Rfl: 6   aspirin 81 MG chewable tablet, Chew 81 mg by mouth daily., Disp: , Rfl:    atorvastatin (LIPITOR) 40 MG tablet, TAKE 1 TABLET EVERY DAY, Disp: 90 tablet, Rfl: 1   bisoprolol (ZEBETA) 5 MG tablet, Take 0.5 tablets (2.5 mg total) by mouth daily., Disp: 90 tablet, Rfl: 1   calcium carbonate (TUMS - DOSED IN MG ELEMENTAL CALCIUM) 500 MG chewable tablet, Chew 1-2 tablets by mouth as needed for indigestion or heartburn., Disp: , Rfl:    Multiple Vitamins-Minerals (MULTIVITAMIN ADULTS 50+ PO), Take 1 tablet by mouth daily. , Disp: , Rfl:    TRELEGY ELLIPTA 100-62.5-25  MCG/ACT AEPB, Inhale 1 puff into the lungs daily., Disp: , Rfl:    VITAMIN D PO, Take 1,000 Int'l Units/kg/day by mouth., Disp: , Rfl:       Objective:   Vitals:   08/13/21 1436  BP: 136/68  Pulse: 73  Temp: 98.8 F (37.1 C)  TempSrc: Oral  SpO2: 94%  Weight: 190 lb 3.2 oz (86.3 kg)  Height: '5\' 5"'$  (1.651 m)    Estimated body mass index is 31.65 kg/m as calculated from the following:   Height as of this encounter: '5\' 5"'$  (1.651 m).   Weight as of this encounter: 190 lb 3.2 oz (86.3 kg).  '@WEIGHTCHANGE'$ @  [employer]   08/13/21 1436  Weight: 190 lb 3.2 oz (86.3 kg)     Physical Exam    General: No distress. Looks normal Neuro: Alert and Oriented x 3. GCS 15. Speech normal Psych: Pleasant Resp:  Barrel Chest - no.  Wheeze -  no, Crackles - no, No overt respiratory distress CVS: Normal heart sounds. Murmurs - no Ext: Stigmata of Connective Tissue Disease - no HEENT: Normal upper airway. PEERL +. No post nasal drip        Assessment:       ICD-10-CM   1. Jaw pain  R68.84 EKG 12-Lead    2. COPD, moderate ([place])  J44.9     3. History of smoking 25-50 pack years  Z87.891     4. Cancer screening  Z12.9          Plan:     Patient Instructions  Jaw pain with left arm tingling- new since 08/12/21  - Ekg normal 08/13/2021  Plan - if worse go to ER - check troponin 08/13/2021 - talk to PCP Margretta Sidle, MD if unresolved  COPD, moderate ([place])  -  some wheezing 04/30/2021 on exam due to not taking trelegy 04/30/2021 - no wheezing and stable exam 08/13/2021 - noted desire to change DME company and also requalify for o2   Plan - do 6 min walk test next few weeks - do ONO study next few weeks - continue trelegy daily - be diligent  - CMA to ensure med list is accurate 08/13/2021 - use albuterol as needed - consider ARNASA study - when details available can share  History of smoking 25-50 pack years   -Glad you are working on quitting and have reduce the  amount of smoke   Plan - Continue work on reducing smoking and ultimately quit  Cancer screening  - LDCT chest wo contrast next 3 weeks (last March 2022)   Follow-up - Return to see APP in 3 weeksor sooner if needed but after CT scan and rreview ONO and 6 min walk results  - CAT score at followup    SIGNATURE    Dr. Brand Males, M.D., F.C.C.P,  Pulmonary and Critical Care Medicine Staff Physician, [place] Director - Interstitial Lung Disease  Program  [facility] [place], [place], [address]  Pager: [phone], If no answer or between  15:00h - 7:00h: call [phone] Telephone: [phone]  3:16 PM 08/13/2021

## 2021-08-13 NOTE — Patient Instructions (Addendum)
Jaw pain with left arm tingling- new since 08/12/21  - Ekg normal 08/13/2021  Plan - if worse go to ER - check troponin 08/13/2021 - talk to PCP Margretta Sidle, MD if unresolved  COPD, moderate ([place])  -  some wheezing 04/30/2021 on exam due to not taking trelegy 04/30/2021 - no wheezing and stable exam 08/13/2021 - noted desire to change DME company and also requalify for o2   Plan - do 6 min walk test next few weeks - do ONO study next few weeks - continue trelegy daily - be diligent  - CMA to ensure med list is accurate 08/13/2021 - use albuterol as needed - consider ARNASA study - when details available can share  History of smoking 25-50 pack years   -Glad you are working on quitting and have reduce the amount of smoke   Plan - Continue work on reducing smoking and ultimately quit  Cancer screening  - LDCT chest wo contrast next 3 weeks (last March 2022)   Follow-up - Return to see APP in 3 weeksor sooner if needed but after CT scan and rreview ONO and 6 min walk results  - CAT score at followup

## 2021-08-14 NOTE — Progress Notes (Signed)
Trop normal. EKG normal. Pls talk to PCP Margretta Sidle, MD  About jaw pain

## 2021-08-20 ENCOUNTER — Other Ambulatory Visit: Payer: Self-pay | Admitting: Internal Medicine

## 2021-08-20 DIAGNOSIS — J9611 Chronic respiratory failure with hypoxia: Secondary | ICD-10-CM

## 2021-08-21 ENCOUNTER — Other Ambulatory Visit: Payer: Self-pay | Admitting: Internal Medicine

## 2021-08-21 ENCOUNTER — Encounter: Payer: Self-pay | Admitting: Internal Medicine

## 2021-08-21 DIAGNOSIS — R519 Headache, unspecified: Secondary | ICD-10-CM

## 2021-08-21 DIAGNOSIS — R42 Dizziness and giddiness: Secondary | ICD-10-CM

## 2021-08-21 DIAGNOSIS — Z8673 Personal history of transient ischemic attack (TIA), and cerebral infarction without residual deficits: Secondary | ICD-10-CM

## 2021-08-22 ENCOUNTER — Telehealth: Payer: Self-pay | Admitting: Pulmonary Disease

## 2021-08-22 DIAGNOSIS — J9611 Chronic respiratory failure with hypoxia: Secondary | ICD-10-CM

## 2021-08-22 NOTE — Telephone Encounter (Signed)
ONO on RA done by [employer] 08/21/21- o2 sats less than 88% for 2 hours and 33 min.   I had Dr Valeta Harms review the results since MR not available and he advised to order o2 2lpm with sleep and repeat ONO on 2lpm.   Spoke with pt and notified of results per Dr. Valeta Harms Pt verbalized understanding and denied any questions. Orders placed. ONO placed in MR's lookat to be signed so that we can scan it in.

## 2021-08-23 ENCOUNTER — Other Ambulatory Visit: Payer: Self-pay | Admitting: Internal Medicine

## 2021-08-23 DIAGNOSIS — Z8673 Personal history of transient ischemic attack (TIA), and cerebral infarction without residual deficits: Secondary | ICD-10-CM

## 2021-08-23 DIAGNOSIS — R519 Headache, unspecified: Secondary | ICD-10-CM

## 2021-08-23 DIAGNOSIS — R42 Dizziness and giddiness: Secondary | ICD-10-CM

## 2021-09-05 ENCOUNTER — Ambulatory Visit (HOSPITAL_COMMUNITY): Payer: Medicare HMO

## 2021-09-09 ENCOUNTER — Telehealth: Payer: Self-pay | Admitting: Internal Medicine

## 2021-09-09 DIAGNOSIS — J449 Chronic obstructive pulmonary disease, unspecified: Secondary | ICD-10-CM

## 2021-09-09 DIAGNOSIS — J9611 Chronic respiratory failure with hypoxia: Secondary | ICD-10-CM

## 2021-09-09 NOTE — Telephone Encounter (Signed)
Overnight pulse oximetry done on   Rachel Wang [birth year]  On August 21, 2021.  Done on room air.  Shows pulse ox less than equal to 88% for 2 hours and 33 minutes and 8 seconds  Plan - Start 2 L nasal cannula at night

## 2021-09-10 ENCOUNTER — Ambulatory Visit: Payer: Medicare HMO | Admitting: Primary Care

## 2021-09-11 NOTE — Telephone Encounter (Signed)
Attempted to call pt but unable to reach. Left message for her to return call. 

## 2021-09-12 NOTE — Telephone Encounter (Signed)
ATC LVMTCB x 1  

## 2021-09-13 NOTE — Telephone Encounter (Signed)
Called and spoke with pt letting her know the results of ONO and recs per MR and she verbalized understanding. Order placed for O2. Nothing further needed.

## 2021-09-14 ENCOUNTER — Ambulatory Visit (HOSPITAL_COMMUNITY)
Admission: RE | Admit: 2021-09-14 | Discharge: 2021-09-14 | Disposition: A | Discharge status: Home / Self Care | Payer: Medicare HMO | Source: Ambulatory Visit | Attending: Internal Medicine | Admitting: Internal Medicine

## 2021-09-14 DIAGNOSIS — Z129 Encounter for screening for malignant neoplasm, site unspecified: Secondary | ICD-10-CM | POA: Insufficient documentation

## 2021-09-14 DIAGNOSIS — Z87891 Personal history of nicotine dependence: Secondary | ICD-10-CM | POA: Diagnosis present

## 2021-09-16 ENCOUNTER — Encounter: Payer: Self-pay | Admitting: Primary Care

## 2021-09-16 ENCOUNTER — Ambulatory Visit: Payer: Medicare HMO | Admitting: Primary Care

## 2021-09-16 DIAGNOSIS — F17200 Nicotine dependence, unspecified, uncomplicated: Secondary | ICD-10-CM | POA: Diagnosis not present

## 2021-09-16 DIAGNOSIS — J449 Chronic obstructive pulmonary disease, unspecified: Secondary | ICD-10-CM

## 2021-09-16 DIAGNOSIS — G4734 Idiopathic sleep related nonobstructive alveolar hypoventilation: Secondary | ICD-10-CM | POA: Diagnosis not present

## 2021-09-16 NOTE — Assessment & Plan Note (Signed)
-   ONO 08/21/21 >> patient spean 2 hours 33 min with SpO2 <88%. Started on 2L oxygen at night.

## 2021-09-16 NOTE — Assessment & Plan Note (Addendum)
-   Patient continues to work on smoking cessation. She is down to 2-3 cigarettes a day. Support provided and encourage she pick quit date.  LDCT done on 09/14/21, results are pending.

## 2021-09-16 NOTE — Assessment & Plan Note (Addendum)
-   Stable interval; No recent exacerbations. Chronic dyspnea/cough with associated fatigue. Maintained on Trelegy. No SABA use. CAT score 16.

## 2021-09-16 NOTE — Progress Notes (Signed)
$'@Patient'r$  ID: Rachel Wang, female    DOB: 06-Oct-1950, 71 y.o.   MRN: [id number]  Chief Complaint  Patient presents with   Follow-up    Pt feels extremely tired.    Referring provider: Margretta Sidle, MD  HPI: 72 year old female, current everyday smoker.  Past medical history significant for COPD, centrilobular emphysema, coronary artery disease, GERD.   09/16/2021- Interim hx  Patient presents today for 3 week follow-up. She saw Dr. Chase Caller on 08/13/21 for jaw pain. She had a nuclear stress test in 2019 which was negative and saw cardiology last in Nov 2022. Trop on 08/13/21 was negative and EKG showed NSR.  She was ordered for ONO, 6MWT and LDCT.  Since her last visit jaw pain has resolved. He biggest complaint is fatigue. She has advanced COPD and is maintained on Trelegy. Cough is baseline, improved. She just got back from a trip to [place]. She walked several miles a day, she had trouble walking up the stairs to the sub way. She did not need to use albuterol rescue inhaler. ONO on 08/21/21 done on room air that showed she spent 2 hours 33 min with SpO2 less than or equal to 88%. She was started on 2L oxygen at night. She is getting oxygen delivered this Thursday. She had low dose CT chest on 8/19, results have not been read by radiologist. Previous imaging in 2022 showed no nodules. She continues to work on smoking cessation, she smokes 2-3 cigarettes a day. CAT score 16.    Allergies  Allergen Reactions   Levofloxacin Nausea And Vomiting   Tramadol Nausea And Vomiting   Adhesive [Tape] Rash and Other (See Comments)    WELTS, also   Latex Rash and Other (See Comments)    WELTS, also    Immunization History  Administered Date(s) Administered   Pneumococcal Conjugate-13 04/03/2017   Pneumococcal Polysaccharide-23 11/15/2018    Past Medical History:  Diagnosis Date   Bronchitis    Claustrophobia    COPD (chronic obstructive pulmonary disease) ([place])    Emphysema of lung  ([place])    Hyperlipidemia    Stroke ([place])    Vertigo     Tobacco History: Social History   Tobacco Use  Smoking Status Every Day   Packs/day: 0.50   Years: 51.00   Total pack years: 25.50   Types: Cigarettes   Start date: 04/08/1967  Smokeless Tobacco Never  Tobacco Comments   Currently smoking 2cigs every 2 weeks per pt as of 04/30/21 ep   Ready to quit: Not Answered Counseling given: Not Answered Tobacco comments: Currently smoking 2cigs every 2 weeks per pt as of 04/30/21 ep   Outpatient Medications Prior to Visit  Medication Sig Dispense Refill   albuterol (VENTOLIN HFA) 108 (90 Base) MCG/ACT inhaler Inhale 2 puffs into the lungs every 6 (six) hours as needed for wheezing or shortness of breath. 8 g 6   aspirin 81 MG chewable tablet Chew 81 mg by mouth daily.     atorvastatin (LIPITOR) 40 MG tablet TAKE 1 TABLET EVERY DAY 90 tablet 1   bisoprolol (ZEBETA) 5 MG tablet Take 0.5 tablets (2.5 mg total) by mouth daily. 90 tablet 1   calcium carbonate (TUMS - DOSED IN MG ELEMENTAL CALCIUM) 500 MG chewable tablet Chew 1-2 tablets by mouth as needed for indigestion or heartburn.     Multiple Vitamins-Minerals (MULTIVITAMIN ADULTS 50+ PO) Take 1 tablet by mouth daily.      TRELEGY ELLIPTA 100-62.5-25 MCG/ACT AEPB  Inhale 1 puff into the lungs daily.     VITAMIN D PO Take 1,000 Int'l Units/kg/day by mouth.     No facility-administered medications prior to visit.   Review of Systems  Review of Systems  Constitutional: Negative.   HENT: Negative.    Respiratory:  Positive for cough and shortness of breath. Negative for chest tightness and wheezing.        Baseline dyspnea and cough  Cardiovascular: Negative.    Physical Exam  BP 110/66 (BP Location: Left Arm, Patient Position: Sitting, Cuff Size: Normal)   Pulse 94   Temp 98.2 F (36.8 C) (Oral)   Ht '5\' 5"'$  (1.651 m)   Wt 187 lb 12.8 oz (85.2 kg)   SpO2 95%   BMI 31.25 kg/m  Physical Exam Constitutional:      Appearance:  Normal appearance.  HENT:     Head: Normocephalic and atraumatic.     Mouth/Throat:     Mouth: Mucous membranes are moist.     Pharynx: Oropharynx is clear.  Cardiovascular:     Rate and Rhythm: Normal rate and regular rhythm.  Pulmonary:     Effort: Pulmonary effort is normal.     Breath sounds: Rhonchi present.  Musculoskeletal:        General: Normal range of motion.  Skin:    General: Skin is warm and dry.  Neurological:     General: No focal deficit present.     Mental Status: She is alert and oriented to person, place, and time. Mental status is at baseline.  Psychiatric:        Mood and Affect: Mood normal.        Behavior: Behavior normal.        Thought Content: Thought content normal.        Judgment: Judgment normal.      Lab Results:  CBC    Component Value Date/Time   WBC 6.7 04/02/2020 1111   WBC 9.2 07/19/2017 0505   RBC 4.77 04/02/2020 1111   RBC 4.50 07/19/2017 0505   HGB 14.0 04/02/2020 1111   HCT 41.7 04/02/2020 1111   PLT 330 04/02/2020 1111   MCV 87 04/02/2020 1111   MCH 29.4 04/02/2020 1111   MCH 28.9 07/19/2017 0505   MCHC 33.6 04/02/2020 1111   MCHC 31.9 07/19/2017 0505   RDW 14.2 04/02/2020 1111   LYMPHSABS 3.1 04/02/2020 1111   MONOABS 0.4 07/18/2017 1420   EOSABS 0.3 04/02/2020 1111   BASOSABS 0.1 04/02/2020 1111    BMET    Component Value Date/Time   NA 138 04/02/2020 1111   K 3.5 04/02/2020 1111   CL 100 04/02/2020 1111   CO2 23 04/02/2020 1111   GLUCOSE 102 (H) 04/02/2020 1111   GLUCOSE 158 (H) 07/19/2017 0505   BUN 5 (L) 04/02/2020 1111   CREATININE 0.82 04/02/2020 1111   CALCIUM 10.0 04/02/2020 1111   GFRNONAA >60 07/19/2017 0505   GFRAA >60 07/19/2017 0505    BNP No results found for: "BNP"  ProBNP No results found for: "PROBNP"  Imaging: No results found.   Assessment & Plan:   COPD, moderate ([place]) - Stable interval; No recent exacerbations. Chronic dyspnea/cough with associated fatigue. Maintained on  Trelegy. No SABA use. CAT score 16.   Nocturnal hypoxia - ONO 08/21/21 >> patient spean 2 hours 33 min with SpO2 <88%. Started on 2L oxygen at night.  Current every day smoker - Patient continues to work on smoking cessation. She is  down to 2-3 cigarettes a day. Support provided and encourage she pick quit date.  LDCT done on 09/14/21, results are pending.    Martyn Ehrich, NP 09/16/2021

## 2021-09-16 NOTE — Patient Instructions (Addendum)
Recommendations: Continue Trelegy 1 puff daily Use Albuterol every 4-6 hours as needed for breakthrough shortness of breath Start to wear 2L oxygen at night  Continue to work on smoking cessation  Continue to work on exercise/stay as active as possible  Look into wedge pillow to elevate head of bed to help with reflux Try pushing back bed time an 1-2 hours, avoid scrolling while in bed   Follow-up: 3-4 months with Dr. Chase Caller or sooner if needed

## 2021-09-23 ENCOUNTER — Ambulatory Visit
Admission: RE | Admit: 2021-09-23 | Discharge: 2021-09-23 | Disposition: A | Discharge status: Home / Self Care | Payer: Medicare HMO | Source: Ambulatory Visit | Attending: Internal Medicine | Admitting: Internal Medicine

## 2021-09-23 DIAGNOSIS — Z8673 Personal history of transient ischemic attack (TIA), and cerebral infarction without residual deficits: Secondary | ICD-10-CM

## 2021-09-23 DIAGNOSIS — R519 Headache, unspecified: Secondary | ICD-10-CM

## 2021-09-23 DIAGNOSIS — R42 Dizziness and giddiness: Secondary | ICD-10-CM

## 2021-09-29 NOTE — Progress Notes (Signed)
Low dose CT without evidence of cancer. Plan - > repeat LDCT in 1 year

## 2022-05-14 ENCOUNTER — Other Ambulatory Visit: Payer: Self-pay | Admitting: Adult Health

## 2022-05-14 DIAGNOSIS — E2839 Other primary ovarian failure: Secondary | ICD-10-CM

## 2022-05-14 DIAGNOSIS — Z1231 Encounter for screening mammogram for malignant neoplasm of breast: Secondary | ICD-10-CM

## 2022-05-28 NOTE — Progress Notes (Unsigned)
OV 09/02/2017  Chief Complaint  Patient presents with   Follow-up    PFT performed 08/31/17.  Pt states she has been doing well since last visit. States she still becomes SOB with exertion.     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 She continues to do well. No interim complaints or exacerbations. COPD cat score is 33 and she is significant amount of symptoms. Although she does feel stable and better. A CT scan of the chest was in 2017 without any cancer. She continues to smoke unable to quit. There are no new issues. She says she cannot do pulmonary rehabilitation because she has to visit her husband was in a nursing home of bilateral strokes. She is happy with her triple inhaler therapy. But on deeper questioning she does admit out of proportion shortness of breath. In fact rated level V out of 5.    OV 10/12/2019   Subjective:  Patient ID: Rachel Wang, female , DOB: Jan 18, 1951, age 72 y.o. years. , MRN: [id number],  ADDRESS: [address] [place] [address] PCP  Patient, No Pcp Per Providers : Treatment Team:  Attending Provider: Kalman Shan, MD   Chief Complaint  Patient presents with   Follow-up    green phlem in throat, runs course for a week.  Not this anymore, more clear than green.      CAT COPD Symptom & Quality of Life Score (GSK trademark) 0 is no burden. 5 is highest burden 09/02/2017   Never Cough -> Cough all the time 4  No phlegm in chest -> Chest is full of phlegm 4  No chest tightness -> Chest feels very tight 3  No dyspnea for 1 flight stairs/hill -> Very dyspneic for 1 flight of stairs 5  No limitations for ADL at home -> Very limited with ADL at home 4  Confident leaving home -> Not at all confident leaving home 4  Sleep soundly -> Do not sleep soundly because of lung condition 5  Lots of Energy -> No energy at all 4  TOTAL Score (max 40)  33         Results for DASHAYLA, THEISSEN (MRN  [id number]) as of 09/02/2017 11:43  Ref. Range 12/28/2015 13:45 08/31/2017 14:23  FEV1-Post Latest Units: L 1.30 1.31  FEV1-%Pred-Post Latest Units: % 63 65   Results for LEANZA, SHEPPERSON (MRN [id number]) as of 09/02/2017 11:43  Ref. Range 12/28/2015 13:45 08/31/2017 14:23  DLCO unc Latest Units: ml/min/mmHg 9.64 13.80  DLCO unc % pred Latest Units: % 37 53      OV 06/07/2020  Subjective:  Patient ID: Rachel Wang, female , DOB: December 04, 1950 , age 72 y.o. , MRN: [id number] , ADDRESS: [address] [place] [address] PCP Avanell Shackleton, NP-C Patient Care Team: Avanell Shackleton, NP-C as PCP - General (Family Medicine) Yates Decamp, MD as Consulting Physician (Cardiology)  This Provider for this visit: Treatment Team:  Attending Provider: Kalman Shan, MD    06/07/2020 -   Chief Complaint  Patient presents with   Follow-up    Pt had recent CT performed and is here today to discuss the results.  Pt states seh has been doing okay since last visit and denies any real complaints.   Follow-up Gold stage II COPD Follow-up smoking  HPI Rachel Wang 72 y.o. -returns for follow-up.  She says she still smokes and has cut down her smoking though.  She smokes  anywhere from 1 to 3 cigarettes daily.  She does not want medication help to quit smoking.  Has COPD stable on Trelegy.  She had CT scan of the chest personally visualized.  No change from 2019.  She does not have any chest pains.  She continues to be reluctant about taking vaccines stable disease     CT Chest data march 2022   IMPRESSION: 1. No significant change from CT 09/18/2017. 2. Bronchiectasis and subpleural cystic change similar to comparison exam. 3. Stable small pulmonary nodules. 4. Coronary artery calcification and Aortic Atherosclerosis (ICD10-I70.0).     Electronically Signed   By: Genevive Bi M.D.   On: 04/11/2020 10:29  No results found.     OV 10/12/2020  Subjective:  Patient ID: Rachel Wang, female , DOB: 10/03/1950 , age 72 y.o. , MRN: [id number] , ADDRESS: [address] [place] [address] PCP Avanell Shackleton, NP-C Patient Care Team: Avanell Shackleton, NP-C as PCP - General (Family Medicine) Yates Decamp, MD as Consulting Physician (Cardiology)  This Provider for this visit: Treatment Team:  Attending Provider: Kalman Shan, MD    Follow-up Gold stage II COPD Follow-up smoking  10/12/2020 -   Chief Complaint  Patient presents with   Follow-up    Patient reports that she has not had trelegy x 1 month, She reports worse since she has not been able to get on this medication.      HPI Hiedi Touchton 72 y.o. -  Advanced COPD Ongoing smoking Vaccine hesitancy Coronary artery calcification Cancer screening last CT scan March 2022 without nodules   HPI Girlie Veltri 72 y.o. -returns for follow-up.  She tells me that in August 2022 she ran out of her Trelegy because of cost issues.  The co-pay for 3 months went up from $150 plus to $400 plus.  She did not call our office because she did not know we could help.  She thinks that the donut hole issue.  She is not aware of any insurance plan change.  Then in the last few to several week she is having increasing cough, chest tightness, shortness of breath and green sputum.  She feels it is the worst she has been but she does not think she needs an admission.  She is cut down on her smoking.  Last visit I referred her to cardiology but apparently the referral did not go through.  She is willing to look at a repeat referral.   CAT Score 10/12/2020 03/02/2017  Total CAT Score 22 36      01/11/2021 - app visit    72 year old female, current every day smoker. PMH significant for COPD, centrilobular emphysema, CAD, GERD, hyperlipidemia. Patient of Dr. Marchelle Gearing, last seen on 10/12/20.    Patient presents today for 3 month follow-up. During last visit she was given sample of Breztri, she has been without  maintenance inhaler for the last three week as she can not afford prescription. Patient developed productive cough last week, she is getting up green mucus. She prefers trelegy over breztri. Breztri worked ok but she still had wheezing and she was not always able to remember to use it twice daily. She does not have a rescue inhaler available.     OV 05/05/2021  Subjective:  Patient ID: Rachel Wang, female , DOB: [birth year] , age 24 y.o. , MRN: [id number] , ADDRESS: [address] [place] [address] PCP No primary care provider on file. Patient Care Team: [place],  Vonna Kotyk, MD as Consulting Physician (Cardiology)  This Provider for this visit: Treatment Team:  Attending Provider: Kalman Shan, MD    05/05/2021 -chest Chief Complaint  Patient presents with   Follow-up    Pt states she has been okay since last visit.    Advanced COPD Ongoing smoking Vaccine hesitancy Coronary artery calcification  - neg nucl stress test 2019  - Dr Anne Fu Nov 2022 Cancer screening last CT scan March 2022 without nodules  -  HPI Olyvia Gopal 72 y.o. -followup visit. Overall feels pretty good. Still smokes. Dealing with insomnia. Appetitie is so s and poor . On trelegy. Last prednisone with APP x 2  in 2022. Cut down smoking to  2 cig in  2 weeks. Did not trake trelegy 1- 3 days. Last CT March 2022    CT Chest data  No results found.     OV 08/13/2021  Subjective:  Patient ID: Rachel Wang, female , DOB: [birth year] , age 72 y.o. , MRN: [id number] , ADDRESS: [address] [place] [address] PCP Lula Olszewski, MD Patient Care Team: Lula Olszewski, MD as PCP - General Yates Decamp, MD as Consulting Physician (Cardiology)  This Provider for this visit: Treatment Team:  Attending Provider: Kalman Shan, MD   -  08/13/2021 -   Chief Complaint  Patient presents with   Follow-up    COPD follow up. Pt states that her breathing is doing well. She states she had a sharp  pain that is in her jaw and left arm pain      HPI Rachel Wang 72 y.o. -returns for follow-up.  Since her last visit no further exacerbations.  She continues Trelegy.  She feels her respiratory status is good.  Yesterday the air quality was poor and there was dense humidity and she did have some cough but that is resolved.  She is changing DME companies.  She wants renewal on her nighttime oxygen which she uses occasionally.  She also wants order for portable oxygen.  I did indicate to her that she will require retesting.  She is okay with this.  In terms of smoking she cut down to 1 cigarette/day within 2 weeks ago her son who lives in [place] had a cardiac arrest but he is now survived.  Therefore she is gone up on her smoking.  Last visit I ordered a low-dose CT scan of the chest but this has not been done.  I do not know why.  She is willing to get this done.  Of note yesterday she developed some left neck pain that went into her occiput.  Today she has the same but also started developing tingling around the left jaw and then also paresthesias in the left arm.  We did an EKG I personally visualized it and it is normal.      OV 05/28/2022  Subjective:  Patient ID: Rachel Wang, female , DOB: 13-Feb-1950 , age 3 y.o. , MRN: [id number] , ADDRESS: [address] [place] [address] PCP Lula Olszewski, MD Patient Care Team: Lula Olszewski, MD as PCP - General Yates Decamp, MD as Consulting Physician (Cardiology)  This Provider for this visit: Treatment Team:  Attending Provider: Kalman Shan, MD    Advanced COPD -Nighttime oxygen as needed -Trelegy Ongoing smoking Vaccine hesitancy Coronary artery calcification  - neg nucl stress test 2019  - Dr Anne Fu Nov 2022 Cancer screening last CT scan March 2022 without nodules  05/28/2022 -  No chief complaint on file.  HPI Kadance Mccuistion 72 y.o. -    CT Chest data  No results found.    PFT     Latest Ref  Rng & Units 08/31/2017    3:52 PM 12/28/2015    1:45 PM  PFT Results  FVC-Pre L 1.51  1.66   FVC-Predicted Pre % 58  63   FVC-Post L 1.54  1.68   FVC-Predicted Post % 59  63   Pre FEV1/FVC % % 86  75   Post FEV1/FCV % % 85  78   FEV1-Pre L 1.29  1.24   FEV1-Predicted Pre % 64  60   FEV1-Post L 1.31  1.30   DLCO uncorrected ml/min/mmHg 13.80  9.64   DLCO UNC% % 53  37   DLCO corrected ml/min/mmHg 13.98    DLCO COR %Predicted % 54    DLVA Predicted % 95  78   TLC L  3.52   TLC % Predicted %  67   RV % Predicted %  89        has a past medical history of Bronchitis, Claustrophobia, COPD (chronic obstructive pulmonary disease) (HCC), Emphysema of lung (HCC), Hyperlipidemia, Stroke (HCC), and Vertigo.   reports that she has been smoking cigarettes. She started smoking about 55 years ago. She has a 25.50 pack-year smoking history. She has never used smokeless tobacco.  Past Surgical History:  Procedure Laterality Date   TUBAL LIGATION      Allergies  Allergen Reactions   Levofloxacin Nausea And Vomiting   Tramadol Nausea And Vomiting   Adhesive [Tape] Rash and Other (See Comments)    WELTS, also   Latex Rash and Other (See Comments)    WELTS, also    Immunization History  Administered Date(s) Administered   Pneumococcal Conjugate-13 04/03/2017   Pneumococcal Polysaccharide-23 11/15/2018    Family History  Problem Relation Age of Onset   HIV Father    Lung cancer Sister    Lung cancer Paternal Aunt    Stroke Maternal Grandmother    Heart attack Maternal Grandfather    Stroke Paternal Grandmother    Hypertension Paternal Grandmother    Heart attack Paternal Grandfather      Current Outpatient Medications:    albuterol (VENTOLIN HFA) 108 (90 Base) MCG/ACT inhaler, Inhale 2 puffs into the lungs every 6 (six) hours as needed for wheezing or shortness of breath., Disp: 8 g, Rfl: 6   aspirin 81 MG chewable tablet, Chew 81 mg by mouth daily., Disp: , Rfl:     atorvastatin (LIPITOR) 40 MG tablet, TAKE 1 TABLET EVERY DAY, Disp: 90 tablet, Rfl: 1   bisoprolol (ZEBETA) 5 MG tablet, Take 0.5 tablets (2.5 mg total) by mouth daily., Disp: 90 tablet, Rfl: 1   calcium carbonate (TUMS - DOSED IN MG ELEMENTAL CALCIUM) 500 MG chewable tablet, Chew 1-2 tablets by mouth as needed for indigestion or heartburn., Disp: , Rfl:    Multiple Vitamins-Minerals (MULTIVITAMIN ADULTS 50+ PO), Take 1 tablet by mouth daily. , Disp: , Rfl:    TRELEGY ELLIPTA 100-62.5-25 MCG/ACT AEPB, Inhale 1 puff into the lungs daily., Disp: , Rfl:    VITAMIN D PO, Take 1,000 Int'l Units/kg/day by mouth., Disp: , Rfl:       Objective:   There were no vitals filed for this visit.  Estimated body mass index is 31.25 kg/m as calculated from the following:   Height as of 09/16/21: 5\' 5"  (1.651 m).   Weight as of 09/16/21: 187 lb  12.8 oz (85.2 kg).  @WEIGHTCHANGE @  There were no vitals filed for this visit.   Physical Exam  General Appearance:    Alert, cooperative, no distress, appears stated age - *** , Deconditioned looking - *** , OBESE  - ***, Sitting on Wheelchair -  ***  Head:    Normocephalic, without obvious abnormality, atraumatic  Eyes:    PERRL, conjunctiva/corneas clear,  Ears:    Normal TM's and external ear canals, both ears  Nose:   Nares normal, septum midline, mucosa normal, no drainage    or sinus tenderness. OXYGEN ON  - *** . Patient is @ ***   Throat:   Lips, mucosa, and tongue normal; teeth and gums normal. Cyanosis on lips - ***  Neck:   Supple, symmetrical, trachea midline, no adenopathy;    thyroid:  no enlargement/tenderness/nodules; no carotid   bruit or JVD  Back:     Symmetric, no curvature, ROM normal, no CVA tenderness  Lungs:     Distress - *** , Wheeze ***, Barrell Chest - ***, Purse lip breathing - ***, Crackles - ***   Chest Wall:    No tenderness or deformity.    Heart:    Regular rate and rhythm, S1 and S2 normal, no rub   or gallop, Murmur - ***   Breast Exam:    NOT DONE  Abdomen:     Soft, non-tender, bowel sounds active all four quadrants,    no masses, no organomegaly. Visceral obesity - ***  Genitalia:   NOT DONE  Rectal:   NOT DONE  Extremities:   Extremities - normal, Has Cane - ***, Clubbing - ***, Edema - ***  Pulses:   2+ and symmetric all extremities  Skin:   Stigmata of Connective Tissue Disease - ***  Lymph nodes:   Cervical, supraclavicular, and axillary nodes normal  Psychiatric:  Neurologic:   Pleasant - ***, Anxious - ***, Flat affect - ***  CAm-ICU - neg, Alert and Oriented x 3 - yes, Moves all 4s - yes, Speech - normal, Cognition - intact    General: No distress. *** Neuro: Alert and Oriented x 3. GCS 15. Speech normal Psych: Pleasant Resp:  Barrel Chest - ***.  Wheeze - ***, Crackles - ***, No overt respiratory distress CVS: Normal heart sounds. Murmurs - *** Ext: Stigmata of Connective Tissue Disease - *** HEENT: Normal upper airway. PEERL +. No post nasal drip        Assessment:     No diagnosis found.     Plan:     Patient Instructions  Jaw pain with left arm tingling- new since 08/12/21  - Ekg normal 08/13/2021  Plan - if worse go to ER - check troponin 08/13/2021 - talk to PCP Lula Olszewski, MD if unresolved  COPD, moderate (HCC)  -  some wheezing 04/30/2021 on exam due to not taking trelegy 04/30/2021 - no wheezing and stable exam 08/13/2021 - noted desire to change DME company and also requalify for o2   Plan - do 6 min walk test next few weeks - do ONO study next few weeks - continue trelegy daily - be diligent  - CMA to ensure med list is accurate 08/13/2021 - use albuterol as needed - consider ARNASA study - when details available can share  History of smoking 25-50 pack years   -Glad you are working on quitting and have reduce the amount of smoke   Plan - Continue work on reducing smoking and  ultimately quit  Cancer screening  - LDCT chest wo contrast next 3 weeks  (last March 2022)   Follow-up - Return to see APP in 3 weeksor sooner if needed but after CT scan and rreview ONO and 6 min walk results  - CAT score at followup    SIGNATURE    Dr. Kalman Shan, M.D., F.C.C.P,  Pulmonary and Critical Care Medicine Staff Physician, [facility] Center Director - Interstitial Lung Disease  Program  Pulmonary Fibrosis [facility] at [facility] [place], [place], [address]  Pager: [phone], If no answer or between  15:00h - 7:00h: call [phone] Telephone: [phone]  6:11 PM 05/28/2022

## 2022-05-28 NOTE — Patient Instructions (Signed)
  COPD, moderate (HCC) with nocturnal hypoxemia COPD exacerbation 05/29/2022  -  some wheezing 05/29/2022 due to polllen   Plan - continue trelegy daily - be diligent - check cbc with diff and blood IgE 05/29/2022 - Please take prednisone 40 mg x1 day, then 30 mg x1 day, then 20 mg x1 day, then 10 mg x1 day, and then 5 mg x1 day and stop - Take doxycycline 100mg  po twice daily x 5 days; take after meals and avoid sunlight     History of smoking 25-50 pack years   -Glad you are working on quitting and have reduce the amount of smoke to 1 cig / 3 weks   Plan - Continue work on reducing smoking and ultimately quit  Cancer screening  - LDCT chest wo contrast August/sept 2024 (last Aug 2023)   Follow-up - Return to see APP or Dr Marchelle Gearing in 3-4 months; 15 min slot

## 2022-05-29 ENCOUNTER — Encounter: Payer: Self-pay | Admitting: Internal Medicine

## 2022-05-29 ENCOUNTER — Ambulatory Visit: Payer: Medicare HMO | Admitting: Internal Medicine

## 2022-05-29 VITALS — BP 122/76 | HR 86 | Temp 98.0°F | Ht 65.0 in | Wt 190.0 lb

## 2022-05-29 DIAGNOSIS — J441 Chronic obstructive pulmonary disease with (acute) exacerbation: Secondary | ICD-10-CM | POA: Diagnosis not present

## 2022-05-29 DIAGNOSIS — Z87891 Personal history of nicotine dependence: Secondary | ICD-10-CM | POA: Diagnosis not present

## 2022-05-29 DIAGNOSIS — Z129 Encounter for screening for malignant neoplasm, site unspecified: Secondary | ICD-10-CM | POA: Diagnosis not present

## 2022-05-29 DIAGNOSIS — J449 Chronic obstructive pulmonary disease, unspecified: Secondary | ICD-10-CM | POA: Diagnosis not present

## 2022-05-29 MED ORDER — PREDNISONE 10 MG PO TABS: ORAL_TABLET | ORAL | 0 refills | Status: AC

## 2022-05-29 MED ORDER — DOXYCYCLINE HYCLATE 100 MG PO TABS: 100.0000 mg | ORAL_TABLET | Freq: Two times a day (BID) | ORAL | 0 refills | Status: DC

## 2022-05-29 NOTE — Addendum Note (Signed)
Addended by: Hedda Slade on: 05/29/2022 04:40 PM   Modules accepted: Orders

## 2022-05-29 NOTE — Addendum Note (Signed)
Addended by: Hedda Slade on: 05/29/2022 04:45 PM   Modules accepted: Orders

## 2022-05-30 LAB — CBC WITH DIFFERENTIAL/PLATELET
Basophils Absolute: 0.1 10*3/uL (ref 0.0–0.1)
Basophils Relative: 1.1 % (ref 0.0–3.0)
Eosinophils Absolute: 0.3 10*3/uL (ref 0.0–0.7)
Eosinophils Relative: 4 % (ref 0.0–5.0)
HCT: 41.7 % (ref 36.0–46.0)
Hemoglobin: 14.3 g/dL (ref 12.0–15.0)
Lymphocytes Relative: 45.6 % (ref 12.0–46.0)
Lymphs Abs: 3 10*3/uL (ref 0.7–4.0)
MCHC: 34.3 g/dL (ref 30.0–36.0)
MCV: 88.9 fl (ref 78.0–100.0)
Monocytes Absolute: 0.7 10*3/uL (ref 0.1–1.0)
Monocytes Relative: 9.9 % (ref 3.0–12.0)
Neutro Abs: 2.6 10*3/uL (ref 1.4–7.7)
Neutrophils Relative %: 39.4 % — ABNORMAL LOW (ref 43.0–77.0)
Platelets: 333 10*3/uL (ref 150.0–400.0)
RBC: 4.69 Mil/uL (ref 3.87–5.11)
RDW: 14.4 % (ref 11.5–15.5)
WBC: 6.6 10*3/uL (ref 4.0–10.5)

## 2022-05-30 LAB — IGE: IgE (Immunoglobulin E), Serum: 63 kU/L (ref <=114)

## 2022-06-24 ENCOUNTER — Other Ambulatory Visit: Payer: Self-pay

## 2022-06-24 ENCOUNTER — Emergency Department (HOSPITAL_COMMUNITY)
Admission: EM | Admit: 2022-06-24 | Discharge: 2022-06-24 | Discharge status: Against Medical Advice | Payer: Medicare HMO | Attending: Emergency Medicine | Admitting: Emergency Medicine

## 2022-06-24 ENCOUNTER — Emergency Department (HOSPITAL_COMMUNITY): Payer: Medicare HMO

## 2022-06-24 DIAGNOSIS — R519 Headache, unspecified: Secondary | ICD-10-CM | POA: Insufficient documentation

## 2022-06-24 DIAGNOSIS — Z5321 Procedure and treatment not carried out due to patient leaving prior to being seen by health care provider: Secondary | ICD-10-CM | POA: Diagnosis not present

## 2022-06-24 DIAGNOSIS — G319 Degenerative disease of nervous system, unspecified: Secondary | ICD-10-CM | POA: Insufficient documentation

## 2022-06-24 DIAGNOSIS — R42 Dizziness and giddiness: Secondary | ICD-10-CM | POA: Diagnosis present

## 2022-06-24 DIAGNOSIS — I6782 Cerebral ischemia: Secondary | ICD-10-CM | POA: Diagnosis not present

## 2022-06-24 LAB — COMPREHENSIVE METABOLIC PANEL
ALT: 16 U/L (ref 0–44)
AST: 23 U/L (ref 15–41)
Albumin: 3.5 g/dL (ref 3.5–5.0)
Alkaline Phosphatase: 93 U/L (ref 38–126)
Anion gap: 11 (ref 5–15)
BUN: <5 mg/dL — ABNORMAL LOW (ref 8–23)
CO2: 25 mmol/L (ref 22–32)
Calcium: 9.2 mg/dL (ref 8.9–10.3)
Chloride: 103 mmol/L (ref 98–111)
Creatinine, Ser: 0.86 mg/dL (ref 0.44–1.00)
GFR, Estimated: >60 mL/min (ref >60)
Glucose, Bld: 136 mg/dL — ABNORMAL HIGH (ref 70–99)
Potassium: 2.9 mmol/L — ABNORMAL LOW (ref 3.5–5.1)
Sodium: 139 mmol/L (ref 135–145)
Total Bilirubin: 0.2 mg/dL — ABNORMAL LOW (ref 0.3–1.2)
Total Protein: 7.9 g/dL (ref 6.5–8.1)

## 2022-06-24 LAB — DIFFERENTIAL
Abs Immature Granulocytes: 0.01 10*3/uL (ref 0.00–0.07)
Basophils Absolute: 0 10*3/uL (ref 0.0–0.1)
Basophils Relative: 1 %
Eosinophils Absolute: 0.2 10*3/uL (ref 0.0–0.5)
Eosinophils Relative: 3 %
Immature Granulocytes: 0 %
Lymphocytes Relative: 45 %
Lymphs Abs: 3.2 10*3/uL (ref 0.7–4.0)
Monocytes Absolute: 0.5 10*3/uL (ref 0.1–1.0)
Monocytes Relative: 8 %
Neutro Abs: 3 10*3/uL (ref 1.7–7.7)
Neutrophils Relative %: 43 %

## 2022-06-24 LAB — I-STAT CHEM 8, ED
BUN: <3 mg/dL — ABNORMAL LOW (ref 8–23)
Calcium, Ion: 1.12 mmol/L — ABNORMAL LOW (ref 1.15–1.40)
Chloride: 102 mmol/L (ref 98–111)
Creatinine, Ser: 0.8 mg/dL (ref 0.44–1.00)
Glucose, Bld: 139 mg/dL — ABNORMAL HIGH (ref 70–99)
HCT: 43 % (ref 36.0–46.0)
Hemoglobin: 14.6 g/dL (ref 12.0–15.0)
Potassium: 2.9 mmol/L — ABNORMAL LOW (ref 3.5–5.1)
Sodium: 143 mmol/L (ref 135–145)
TCO2: 27 mmol/L (ref 22–32)

## 2022-06-24 LAB — PROTIME-INR
INR: 1.1 (ref 0.8–1.2)
Prothrombin Time: 14.2 seconds (ref 11.4–15.2)

## 2022-06-24 LAB — CBC
HCT: 43 % (ref 36.0–46.0)
Hemoglobin: 14.2 g/dL (ref 12.0–15.0)
MCH: 30.1 pg (ref 26.0–34.0)
MCHC: 33 g/dL (ref 30.0–36.0)
MCV: 91.1 fL (ref 80.0–100.0)
Platelets: 324 10*3/uL (ref 150–400)
RBC: 4.72 MIL/uL (ref 3.87–5.11)
RDW: 14.5 % (ref 11.5–15.5)
WBC: 6.9 10*3/uL (ref 4.0–10.5)
nRBC: 0 % (ref 0.0–0.2)

## 2022-06-24 LAB — APTT: aPTT: 34 seconds (ref 24–36)

## 2022-06-24 LAB — ETHANOL: Alcohol, Ethyl (B): <10 mg/dL (ref <10)

## 2022-06-24 NOTE — ED Triage Notes (Signed)
Pt arrives with c/o dizziness that started Sunday. Pt endorses n/v, headache, and left thigh numbness after the episode of dizziness. Per pt, the headache and intermittent dizziness are the only symptoms still present a this time. Pt a&ox4.

## 2022-06-24 NOTE — ED Notes (Signed)
Pt stated she did not want to wait any longer. Pt seen leaving the ED with family. 

## 2022-06-26 ENCOUNTER — Encounter: Payer: Self-pay | Admitting: Adult Health

## 2022-06-27 ENCOUNTER — Other Ambulatory Visit: Payer: Self-pay | Admitting: Adult Health

## 2022-06-27 DIAGNOSIS — R42 Dizziness and giddiness: Secondary | ICD-10-CM

## 2022-06-27 DIAGNOSIS — R29818 Other symptoms and signs involving the nervous system: Secondary | ICD-10-CM

## 2022-07-25 NOTE — Progress Notes (Signed)
Chart review shows that patient reportedly checked into the emergency department with complaints of dizziness, numbness, and had some screening workup started to help rule out etiologies of symptoms that would include stroke and this was on the order set.  Patient ended up leaving before she could be seen by provider.

## 2022-10-23 ENCOUNTER — Encounter: Payer: Self-pay | Admitting: Dermatology

## 2022-10-23 ENCOUNTER — Ambulatory Visit: Payer: Medicare HMO | Admitting: Dermatology

## 2022-10-23 VITALS — BP 116/77 | HR 94

## 2022-10-23 DIAGNOSIS — L821 Other seborrheic keratosis: Secondary | ICD-10-CM | POA: Diagnosis not present

## 2022-10-23 DIAGNOSIS — B070 Plantar wart: Secondary | ICD-10-CM | POA: Diagnosis not present

## 2022-10-23 NOTE — Patient Instructions (Addendum)
/;.,Hello Miss Rachel Wang,  Thank you for visiting my office today. Your dedication to addressing your dermatological concerns and improving your health is greatly appreciated.  Here is a summary of the key instructions from today's visit:  - Treatment for Flat Warts:   - Procedure: We have treated the warts and DPNs with nitrogen freezing. They may blister and peel off.   - Aftercare: Apply Vaseline morning and night to aid healing.   - Future Considerations: If the spots return, we may need to freeze them again.  - Medication:   - New Prescription: Start taking Cimetidine tablets, one tablet a day, to help your immune system fight the virus causing the warts. This will replace your current antacid medication.  - Follow-Up:   - Next Appointment: We will see you in six weeks to assess the progress and determine if further treatment is necessary.  - Pain Management:   - Recommendation: You may take Tylenol if you experience discomfort from the treatment.  - Additional Care:   - Skin Care: Use plain Vaseline or Aquaphor before bed to soothe the treated areas.  Please follow these instructions carefully and reach out to our office if you have any questions or concerns. We look forward to seeing you at your follow-up appointment.  Warm regards,  Dr. Langston Reusing, Dermatology   Cryotherapy Aftercare  Wash gently with soap and water everyday.   Apply Vaseline and Band-Aid daily until healed.    Important Information  Due to recent changes in healthcare laws, you may see results of your pathology and/or laboratory studies on MyChart before the doctors have had a chance to review them. We understand that in some cases there may be results that are confusing or concerning to you. Please understand that not all results are received at the same time and often the doctors may need to interpret multiple results in order to provide you with the best plan of care or course of treatment.  Therefore, we ask that you please give [place] 2 business days to thoroughly review all your results before contacting the office for clarification. Should we see a critical lab result, you will be contacted sooner.   If You Need Anything After Your Visit  If you have any questions or concerns for your doctor, please call our main line at [phone] If no one answers, please leave a voicemail as directed and we will return your call as soon as possible. Messages left after 4 pm will be answered the following business day.   You may also send [place] a message via MyChart. We typically respond to MyChart messages within 1-2 business days.  For prescription refills, please ask your pharmacy to contact our office. Our fax number is [phone].  If you have an urgent issue when the clinic is closed that cannot wait until the next business day, you can page your doctor at the number below.    Please note that while we do our best to be available for urgent issues outside of office hours, we are not available 24/7.   If you have an urgent issue and are unable to reach [place], you may choose to seek medical care at your doctor's office, retail clinic, urgent care center, or emergency room.  If you have a medical emergency, please immediately call 911 or go to the emergency department. In the event of inclement weather, please call our main line at [phone] for an update on the status of any delays or closures.  Dermatology Medication Tips: Please keep the boxes that topical medications come in in order to help keep track of the instructions about where and how to use these. Pharmacies typically print the medication instructions only on the boxes and not directly on the medication tubes.   If your medication is too expensive, please contact our office at [phone] or send [place] a message through MyChart.   We are unable to tell what your co-pay for medications will be in advance as this is different  depending on your insurance coverage. However, we may be able to find a substitute medication at lower cost or fill out paperwork to get insurance to cover a needed medication.   If a prior authorization is required to get your medication covered by your insurance company, please allow [place] 1-2 business days to complete this process.  Drug prices often vary depending on where the prescription is filled and some pharmacies may offer cheaper prices.  The website www.goodrx.com contains coupons for medications through different pharmacies. The prices here do not account for what the cost may be with help from insurance (it may be cheaper with your insurance), but the website can give you the price if you did not use any insurance.  - You can print the associated coupon and take it with your prescription to the pharmacy.  - You may also stop by our office during regular business hours and pick up a GoodRx coupon card.  - If you need your prescription sent electronically to a different pharmacy, notify our office through [facility] or by phone at [phone]

## 2022-10-23 NOTE — Progress Notes (Signed)
   Follow-Up Visit   Subjective  Rachel Wang is a 72 y.o. female who presents for the following:   New patient here today concerning darkened spots at feet and legs. She noticed areas last year. Denies itchy or other symptoms. Also reports some dark itchy areas under right eye.    The following portions of the chart were reviewed this encounter and updated as appropriate: medications, allergies, medical history  Review of Systems:  No other skin or systemic complaints except as noted in HPI or Assessment and Plan.  Objective  Well appearing patient in no apparent distress; mood and affect are within normal limits.   A focused examination was performed of the following areas: Face, b/l legs, b/l feet  Relevant exam findings are noted in the Assessment and Plan.  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  Multiple treatments and methods may be required to clear warts and it is possible treatment may not be successful.  Treatment risks include discoloration; scarring and there is still potential for wart recurrence.  Procedure: We have treated the warts and DPNs with nitrogen freezing. They may blister and peel off.   - Aftercare: Apply Vaseline morning and night to aid healing.   - Future Considerations: If the spots return, we may need to freeze them again.    - New Prescription: Start taking Cimetidine tablets, one tablet a day, to help your immune system fight the virus causing the warts. This will replace your current antacid medication.    Destruction of lesion - b/l legs and feet >20 (35)  Destruction method: cryotherapy   Informed consent: discussed and consent obtained   Lesion destroyed using liquid  nitrogen: Yes   Region frozen until ice ball extended beyond lesion: Yes   Outcome: patient tolerated procedure well with no complications   Post-procedure details: wound care instructions given   Additional details:  Prior to procedure, discussed risks of blister formation, small wound, skin dyspigmentation, or rare scar following cryotherapy. Recommend Vaseline ointment to treated areas while healing.    DERMATOSIS PAPULOSIS NIGRA - Stuck-on, waxy, tan-brown papules and/or plaques  - Benign-appearing - Discussed benign etiology and prognosis. - Discussed cosmetic removal - Call for any changes      Return in about 6 weeks (around 12/04/2022) for week wart follow up.  I, Asher Muir, CMA, am acting as scribe for [employer], DO.   Documentation: I have reviewed the above documentation for accuracy and completeness, and I agree with the above.  Langston Reusing, DO

## 2022-10-26 ENCOUNTER — Encounter: Payer: Self-pay | Admitting: Dermatology

## 2022-12-04 ENCOUNTER — Ambulatory Visit: Payer: Medicare HMO | Admitting: Dermatology

## 2022-12-16 ENCOUNTER — Other Ambulatory Visit: Payer: Medicare HMO

## 2023-02-09 ENCOUNTER — Other Ambulatory Visit: Payer: Self-pay | Admitting: Adult Health

## 2023-02-09 DIAGNOSIS — E2839 Other primary ovarian failure: Secondary | ICD-10-CM

## 2023-02-11 ENCOUNTER — Other Ambulatory Visit: Payer: Medicare HMO

## 2023-06-17 ENCOUNTER — Emergency Department (HOSPITAL_COMMUNITY)

## 2023-06-17 ENCOUNTER — Encounter (HOSPITAL_COMMUNITY): Payer: Self-pay

## 2023-06-17 ENCOUNTER — Emergency Department (HOSPITAL_COMMUNITY)
Admission: EM | Admit: 2023-06-17 | Discharge: 2023-06-17 | Disposition: A | Discharge status: Home / Self Care | Attending: Emergency Medicine | Admitting: Emergency Medicine

## 2023-06-17 ENCOUNTER — Other Ambulatory Visit: Payer: Self-pay

## 2023-06-17 DIAGNOSIS — R112 Nausea with vomiting, unspecified: Secondary | ICD-10-CM | POA: Diagnosis present

## 2023-06-17 DIAGNOSIS — R251 Tremor, unspecified: Secondary | ICD-10-CM | POA: Diagnosis not present

## 2023-06-17 DIAGNOSIS — J449 Chronic obstructive pulmonary disease, unspecified: Secondary | ICD-10-CM | POA: Insufficient documentation

## 2023-06-17 DIAGNOSIS — Z5321 Procedure and treatment not carried out due to patient leaving prior to being seen by health care provider: Secondary | ICD-10-CM | POA: Diagnosis not present

## 2023-06-17 LAB — CBC WITH DIFFERENTIAL/PLATELET
Abs Immature Granulocytes: 0.02 10*3/uL (ref 0.00–0.07)
Basophils Absolute: 0 10*3/uL (ref 0.0–0.1)
Basophils Relative: 0 %
Eosinophils Absolute: 0.1 10*3/uL (ref 0.0–0.5)
Eosinophils Relative: 1 %
HCT: 41.2 % (ref 36.0–46.0)
Hemoglobin: 13.9 g/dL (ref 12.0–15.0)
Immature Granulocytes: 0 %
Lymphocytes Relative: 9 %
Lymphs Abs: 0.8 10*3/uL (ref 0.7–4.0)
MCH: 30 pg (ref 26.0–34.0)
MCHC: 33.7 g/dL (ref 30.0–36.0)
MCV: 89 fL (ref 80.0–100.0)
Monocytes Absolute: 0.9 10*3/uL (ref 0.1–1.0)
Monocytes Relative: 10 %
Neutro Abs: 7.4 10*3/uL (ref 1.7–7.7)
Neutrophils Relative %: 80 %
Platelets: 248 10*3/uL (ref 150–400)
RBC: 4.63 MIL/uL (ref 3.87–5.11)
RDW: 14.7 % (ref 11.5–15.5)
WBC: 9.3 10*3/uL (ref 4.0–10.5)
nRBC: 0 % (ref 0.0–0.2)

## 2023-06-17 LAB — COMPREHENSIVE METABOLIC PANEL WITH GFR
ALT: 14 U/L (ref 0–44)
AST: 18 U/L (ref 15–41)
Albumin: 3.5 g/dL (ref 3.5–5.0)
Alkaline Phosphatase: 93 U/L (ref 38–126)
Anion gap: 9 (ref 5–15)
BUN: 9 mg/dL (ref 8–23)
CO2: 26 mmol/L (ref 22–32)
Calcium: 8.7 mg/dL — ABNORMAL LOW (ref 8.9–10.3)
Chloride: 102 mmol/L (ref 98–111)
Creatinine, Ser: 0.85 mg/dL (ref 0.44–1.00)
GFR, Estimated: >60 mL/min (ref >60)
Glucose, Bld: 131 mg/dL — ABNORMAL HIGH (ref 70–99)
Potassium: 3.1 mmol/L — ABNORMAL LOW (ref 3.5–5.1)
Sodium: 137 mmol/L (ref 135–145)
Total Bilirubin: 0.8 mg/dL (ref 0.0–1.2)
Total Protein: 8.2 g/dL — ABNORMAL HIGH (ref 6.5–8.1)

## 2023-06-17 LAB — MAGNESIUM: Magnesium: 1.9 mg/dL (ref 1.7–2.4)

## 2023-06-17 LAB — TROPONIN I (HIGH SENSITIVITY): Troponin I (High Sensitivity): 4 ng/L (ref <18)

## 2023-06-17 MED ORDER — ACETAMINOPHEN 325 MG PO TABS
650.0000 mg | ORAL_TABLET | Freq: Once | ORAL | Status: AC
  Administered 2023-06-17: 650 mg via ORAL
  Filled 2023-06-17: qty 2

## 2023-06-17 NOTE — ED Notes (Signed)
PT LEFT AMA 

## 2023-06-17 NOTE — ED Triage Notes (Signed)
 Pt arrived reporting N/V, shaking since this morning. States Hx of COPD and feels like its flaring up. Denies any recent illness. No other symptoms

## 2023-06-23 ENCOUNTER — Emergency Department (HOSPITAL_COMMUNITY)

## 2023-06-23 ENCOUNTER — Emergency Department (HOSPITAL_COMMUNITY)
Admission: EM | Admit: 2023-06-23 | Discharge: 2023-06-23 | Disposition: A | Discharge status: Home / Self Care | Attending: Emergency Medicine | Admitting: Emergency Medicine

## 2023-06-23 ENCOUNTER — Other Ambulatory Visit: Payer: Self-pay

## 2023-06-23 ENCOUNTER — Encounter (HOSPITAL_COMMUNITY): Payer: Self-pay

## 2023-06-23 DIAGNOSIS — J441 Chronic obstructive pulmonary disease with (acute) exacerbation: Secondary | ICD-10-CM | POA: Diagnosis not present

## 2023-06-23 DIAGNOSIS — Z91040 Latex allergy status: Secondary | ICD-10-CM | POA: Insufficient documentation

## 2023-06-23 DIAGNOSIS — R7989 Other specified abnormal findings of blood chemistry: Secondary | ICD-10-CM | POA: Insufficient documentation

## 2023-06-23 DIAGNOSIS — Z7982 Long term (current) use of aspirin: Secondary | ICD-10-CM | POA: Insufficient documentation

## 2023-06-23 DIAGNOSIS — R0602 Shortness of breath: Secondary | ICD-10-CM | POA: Diagnosis present

## 2023-06-23 DIAGNOSIS — I2510 Atherosclerotic heart disease of native coronary artery without angina pectoris: Secondary | ICD-10-CM | POA: Insufficient documentation

## 2023-06-23 DIAGNOSIS — R0600 Dyspnea, unspecified: Secondary | ICD-10-CM

## 2023-06-23 LAB — BASIC METABOLIC PANEL WITH GFR
Anion gap: 16 — ABNORMAL HIGH (ref 5–15)
BUN: 10 mg/dL (ref 8–23)
CO2: 27 mmol/L (ref 22–32)
Calcium: 9.1 mg/dL (ref 8.9–10.3)
Chloride: 93 mmol/L — ABNORMAL LOW (ref 98–111)
Creatinine, Ser: 0.86 mg/dL (ref 0.44–1.00)
GFR, Estimated: >60 mL/min (ref >60)
Glucose, Bld: 94 mg/dL (ref 70–99)
Potassium: 2.6 mmol/L — CL (ref 3.5–5.1)
Sodium: 136 mmol/L (ref 135–145)

## 2023-06-23 LAB — CBC
HCT: 41 % (ref 36.0–46.0)
Hemoglobin: 13.5 g/dL (ref 12.0–15.0)
MCH: 28.7 pg (ref 26.0–34.0)
MCHC: 32.9 g/dL (ref 30.0–36.0)
MCV: 87 fL (ref 80.0–100.0)
Platelets: 352 10*3/uL (ref 150–400)
RBC: 4.71 MIL/uL (ref 3.87–5.11)
RDW: 14.6 % (ref 11.5–15.5)
WBC: 8.5 10*3/uL (ref 4.0–10.5)
nRBC: 0 % (ref 0.0–0.2)

## 2023-06-23 LAB — RESP PANEL BY RT-PCR (RSV, FLU A&B, COVID)  RVPGX2
Influenza A by PCR: NEGATIVE
Influenza B by PCR: NEGATIVE
Resp Syncytial Virus by PCR: NEGATIVE
SARS Coronavirus 2 by RT PCR: NEGATIVE

## 2023-06-23 LAB — TROPONIN I (HIGH SENSITIVITY)
Troponin I (High Sensitivity): 4 ng/L (ref <18)
Troponin I (High Sensitivity): 6 ng/L (ref <18)

## 2023-06-23 LAB — I-STAT CG4 LACTIC ACID, ED: Lactic Acid, Venous: 1.3 mmol/L (ref 0.5–1.9)

## 2023-06-23 LAB — BRAIN NATRIURETIC PEPTIDE: B Natriuretic Peptide: 23 pg/mL (ref 0.0–100.0)

## 2023-06-23 LAB — D-DIMER, QUANTITATIVE: D-Dimer, Quant: 1.45 ug{FEU}/mL — ABNORMAL HIGH (ref 0.00–0.50)

## 2023-06-23 MED ORDER — METHYLPREDNISOLONE SODIUM SUCC 125 MG IJ SOLR
125.0000 mg | Freq: Once | INTRAMUSCULAR | Status: AC
  Administered 2023-06-23: 125 mg via INTRAVENOUS
  Filled 2023-06-23: qty 2

## 2023-06-23 MED ORDER — PREDNISONE 10 MG PO TABS: 40.0000 mg | ORAL_TABLET | Freq: Every day | ORAL | 0 refills | Status: AC

## 2023-06-23 MED ORDER — POTASSIUM CHLORIDE CRYS ER 20 MEQ PO TBCR
40.0000 meq | EXTENDED_RELEASE_TABLET | Freq: Once | ORAL | Status: AC
  Administered 2023-06-23: 40 meq via ORAL
  Filled 2023-06-23: qty 2

## 2023-06-23 MED ORDER — POTASSIUM CHLORIDE 10 MEQ/100ML IV SOLN
10.0000 meq | Freq: Once | INTRAVENOUS | Status: AC
  Administered 2023-06-23: 10 meq via INTRAVENOUS
  Filled 2023-06-23: qty 100

## 2023-06-23 MED ORDER — MAGNESIUM SULFATE 2 GM/50ML IV SOLN
2.0000 g | Freq: Once | INTRAVENOUS | Status: AC
  Administered 2023-06-23: 2 g via INTRAVENOUS
  Filled 2023-06-23: qty 50

## 2023-06-23 MED ORDER — IPRATROPIUM-ALBUTEROL 0.5-2.5 (3) MG/3ML IN SOLN
3.0000 mL | Freq: Once | RESPIRATORY_TRACT | Status: AC
  Administered 2023-06-23: 3 mL via RESPIRATORY_TRACT
  Filled 2023-06-23: qty 3

## 2023-06-23 MED ORDER — IOHEXOL 350 MG/ML SOLN
75.0000 mL | Freq: Once | INTRAVENOUS | Status: AC | PRN
  Administered 2023-06-23: 75 mL via INTRAVENOUS

## 2023-06-23 MED ORDER — POTASSIUM CHLORIDE CRYS ER 20 MEQ PO TBCR: 20.0000 meq | EXTENDED_RELEASE_TABLET | Freq: Two times a day (BID) | ORAL | 0 refills | Status: DC

## 2023-06-23 MED ORDER — DOXYCYCLINE HYCLATE 100 MG PO CAPS: 100.0000 mg | ORAL_CAPSULE | Freq: Two times a day (BID) | ORAL | 0 refills | Status: AC

## 2023-06-23 NOTE — ED Notes (Signed)
 Went to [facility]

## 2023-06-23 NOTE — ED Triage Notes (Signed)
 Pt to ED via [facility] from MD office. Pt had 02 sat =86% on room air at MD office. EMS reports 02 sat = 96%. Pt has not been feeling well, feeling lightheaded, fatigue, productive cough, chest pain and shortness of breath for past few days. Pt returned from a car trip to [facility] approximately a week ago. Pt received a duoneb by EMS.

## 2023-06-23 NOTE — Discharge Instructions (Signed)
 Your test results today were overall reassuring.  Your potassium was low.  We did replace most of it.  Prescriptions were sent to your pharmacy: -Potassium chloride  is a potassium supplement for treatment of low potassium.  Take as prescribed for the next 3 days. -Prednisone  and doxycycline  are for COPD exacerbation.  Take as prescribed for the next 5 days.  Use breathing treatments at home as needed.  Return to the emergency department for any worsening of symptoms.

## 2023-06-23 NOTE — ED Provider Notes (Signed)
 Care of patient assumed from Dr. Ranelle Buys. patient presenting for shortness of breath.  History of COPD.  Has had 1 week of productive cough.  She has received treatment for COPD exacerbation and replacement potassium.  Currently awaiting CTA.  Will need reassessment. Physical Exam  BP 126/68   Pulse 78   Temp 97.6 F (36.4 C)   Resp 19   Ht 5\' 5"  (1.651 m)   Wt 82.6 kg   SpO2 94%   BMI 30.29 kg/m   Physical Exam Vitals and nursing note reviewed.  Constitutional:      General: She is not in acute distress.    Appearance: She is well-developed. She is not ill-appearing, toxic-appearing or diaphoretic.  HENT:     Head: Normocephalic and atraumatic.     Mouth/Throat:     Mouth: Mucous membranes are moist.  Eyes:     Extraocular Movements: Extraocular movements intact.     Conjunctiva/sclera: Conjunctivae normal.  Cardiovascular:     Rate and Rhythm: Normal rate and regular rhythm.  Pulmonary:     Effort: Pulmonary effort is normal. No tachypnea, accessory muscle usage or respiratory distress.     Breath sounds: No decreased breath sounds, wheezing, rhonchi or rales.  Chest:     Chest wall: No tenderness.  Abdominal:     Palpations: Abdomen is soft.     Tenderness: There is no abdominal tenderness.  Musculoskeletal:        General: No swelling. Normal range of motion.     Cervical back: Normal range of motion and neck supple.  Skin:    General: Skin is warm and dry.     Coloration: Skin is not cyanotic or pale.  Neurological:     General: No focal deficit present.     Mental Status: She is alert and oriented to person, place, and time.  Psychiatric:        Mood and Affect: Mood normal.        Behavior: Behavior normal.     Procedures  Procedures  ED Course / MDM   Clinical Course as of 06/23/23 1808  Tue Jun 23, 2023  1553 D-dimer significantly elevated.  Will obtain CTA chest to evaluate for PE as cause of patient's dyspnea  I, Rafael Bun DO, am transitioning  care of this patient to the oncoming provider pending CTA chest, reevaluation and disposition [MP]    Clinical Course User Index [MP] Sallyanne Creamer, DO   Medical Decision Making Amount and/or Complexity of Data Reviewed Labs: ordered. Radiology: ordered.  Risk Prescription drug management.   CTA did not show any evidence of PE or any focal consolidations.  She has multiple small bilateral pulmonary nodules that are unchanged from prior imaging.  On assessment, patient is resting comfortably.  Her breathing is unlabored.  She reports improved symptoms following ED treatment.  SpO2 remains normal on room air.  On lung auscultation, no significant wheezing is present.  She is in favor of discharge home.  Patient was prescribed ongoing potassium supplement, prednisone , and doxycycline .  She does have a nebulizer at home with medication.  She was advised to return for any worsening of symptoms.  She was discharged in stable condition.       Iva Mariner, MD 06/23/23 [phone]

## 2023-06-23 NOTE — ED Notes (Signed)
 Patient ambulated to the bathroom with her assistive device.

## 2023-06-23 NOTE — ED Provider Notes (Signed)
 [place] EMERGENCY DEPARTMENT AT [facility] Provider Note   CSN: [id number] Arrival date & time: 06/23/23  1317     History  Chief Complaint  Patient presents with   Shortness of Breath    Rachel Wang is a 73 y.o. female.  With a history of COPD and CAD who presents to ED for shortness of breath.  Patient has experienced increased shortness of breath from her usual baseline.  She has 2 L oxygen  as needed at home but is not O2 dependent 24/7.  1 week of increased coughing with green sputum shortness of breath.  She mentions recent long car trip from 9 hours after returning from [place]  about a week ago following her daughter's wedding.  Some associated chest pain with coughing.  No nausea vomiting fevers chills or changes in bowel habits.  Was seen in her PCP office earlier today noted to be hypoxic on room air and sent here for further evaluation   Shortness of Breath      Home Medications Prior to Admission medications   Medication Sig Start Date End Date Taking? Authorizing Provider  albuterol  (VENTOLIN  HFA) 108 (90 Base) MCG/ACT inhaler Inhale 2 puffs into the lungs every 6 (six) hours as needed for wheezing or shortness of breath. 01/11/21   Antonio Baumgarten, NP  aspirin  81 MG chewable tablet Chew 81 mg by mouth daily.    [provider]  bisoprolol  (ZEBETA ) 5 MG tablet Take 0.5 tablets (2.5 mg total) by mouth daily. 12/01/19   Knox Perl, MD  calcium  carbonate (TUMS - DOSED IN MG ELEMENTAL CALCIUM ) 500 MG chewable tablet Chew 1-2 tablets by mouth as needed for indigestion or heartburn.    [provider]  Multiple Vitamins-Minerals (MULTIVITAMIN ADULTS 50+ PO) Take 1 tablet by mouth daily.     [provider]  TRELEGY ELLIPTA  100-62.5-25 MCG/ACT AEPB Inhale 1 puff into the lungs daily. 07/11/21   [provider]  VITAMIN D PO Take 1,000 Int'l Units/kg/day by mouth.    [provider]      Allergies     Levofloxacin, Tramadol, Adhesive [tape], and Latex    Review of Systems   Review of Systems  Respiratory:  Positive for shortness of breath.     Physical Exam Updated Vital Signs BP 98/63   Pulse 82   Temp 98.2 F (36.8 C) (Oral)   Resp 17   Ht 5\' 5"  (1.651 m)   Wt 82.6 kg   SpO2 100%   BMI 30.29 kg/m  Physical Exam Vitals and nursing note reviewed.  HENT:     Head: Normocephalic and atraumatic.  Eyes:     Pupils: Pupils are equal, round, and reactive to light.  Cardiovascular:     Rate and Rhythm: Normal rate and regular rhythm.  Pulmonary:     Effort: Pulmonary effort is normal.     Breath sounds: Examination of the right-upper field reveals wheezing. Examination of the left-upper field reveals wheezing. Examination of the right-middle field reveals wheezing. Examination of the left-middle field reveals wheezing. Wheezing present.  Abdominal:     Palpations: Abdomen is soft.     Tenderness: There is no abdominal tenderness.  Musculoskeletal:     Right lower leg: No edema.     Left lower leg: No edema.  Skin:    General: Skin is warm and dry.  Neurological:     Mental Status: She is alert.  Psychiatric:  Mood and Affect: Mood normal.     ED Results / Procedures / Treatments   Labs (all labs ordered are listed, but only abnormal results are displayed) Labs Reviewed  BASIC METABOLIC PANEL WITH GFR - Abnormal; Notable for the following components:      Result Value   Potassium 2.6 (*)    Chloride 93 (*)    Anion gap 16 (*)    All other components within normal limits  D-DIMER, QUANTITATIVE - Abnormal; Notable for the following components:   D-Dimer, Quant 1.45 (*)    All other components within normal limits  RESP PANEL BY RT-PCR (RSV, FLU A&B, COVID)  RVPGX2  CBC  BRAIN NATRIURETIC PEPTIDE  I-STAT CG4 LACTIC ACID, ED  I-STAT CG4 LACTIC ACID, ED  TROPONIN I (HIGH SENSITIVITY)  TROPONIN I (HIGH SENSITIVITY)    EKG EKG  Interpretation Date/Time:  Tuesday Jun 23 2023 13:24:24 EDT Ventricular Rate:  83 PR Interval:  132 QRS Duration:  84 QT Interval:  396 QTC Calculation: 465 R Axis:   52  Text Interpretation: Normal sinus rhythm Nonspecific ST abnormality Abnormal ECG When compared with ECG of 17-Jun-2023 18:27, PREVIOUS ECG IS PRESENT Confirmed by Rafael Bun [phone]) on 06/23/2023 3:30:49 PM  Radiology No results found.  Procedures Procedures    Medications Ordered in ED Medications  potassium chloride  10 mEq in 100 mL IVPB (10 mEq Intravenous New Bag/Given 06/23/23 1552)  magnesium  sulfate IVPB 2 g 50 mL (2 g Intravenous New Bag/Given 06/23/23 1553)  ipratropium-albuterol  (DUONEB) 0.5-2.5 (3) MG/3ML nebulizer solution 3 mL (3 mLs Nebulization Given 06/23/23 1526)  methylPREDNISolone  sodium succinate (SOLU-MEDROL ) 125 mg/2 mL injection 125 mg (125 mg Intravenous Given 06/23/23 1529)  potassium chloride  SA (KLOR-CON  M) CR tablet 40 mEq (40 mEq Oral Given 06/23/23 1532)    ED Course/ Medical Decision Making/ A&P Clinical Course as of 06/23/23 1554  Tue Jun 23, 2023  1553 D-dimer significantly elevated.  Will obtain CTA chest to evaluate for PE as cause of patient's dyspnea  I, Rafael Bun DO, am transitioning care of this patient to the oncoming provider pending CTA chest, reevaluation and disposition [MP]    Clinical Course User Index [MP] Sallyanne Creamer, DO                                 Medical Decision Making 73 year old female with history as above presenting for shortness of breath and productive coughing x 1 week.  Afebrile and normotensive on my exam.  Scattered wheezes throughout.  Differential diagnosis includes ACS, COPD exacerbation, viral URI, pneumonia.  Also need to consider pulmonary embolism given that she is not on anticoagulation and to recently returned from a long car ride.  Will obtain laboratory workup I sensitive troponin, D-dimer, metabolic panel, CBC along with EKG  and chest x-ray.  If troponin significantly elevated will obtain delta troponin to trend.  If D-dimer significantly elevated will obtain CTA chest to evaluate for PE.  Will give DuoNeb Solu-Medrol  and reassess  Amount and/or Complexity of Data Reviewed Labs: ordered. Radiology: ordered.  Risk Prescription drug management.           Final Clinical Impression(s) / ED Diagnoses Final diagnoses:  Dyspnea, unspecified type  COPD exacerbation [facility])    Rx / DC Orders ED Discharge Orders     None         Sallyanne Creamer, DO 06/23/23 1554

## 2023-10-01 ENCOUNTER — Other Ambulatory Visit: Payer: Medicare HMO

## 2023-10-04 ENCOUNTER — Telehealth: Payer: Self-pay | Admitting: Internal Medicine

## 2023-10-04 NOTE — Telephone Encounter (Signed)
 Seen last  year and after that advised LDCT and followup but see both not done. She did have CT chest in the ER in may 2025 and there is no lung cancer and this Is good news  Please now give her followup appt with app for her copd and recurrent ae-copd. She has high eos and might benefit from nucala/dupixent

## 2023-10-05 NOTE — Telephone Encounter (Signed)
 Called patient and got appointment scheduled for 10/14.

## 2023-10-20 ENCOUNTER — Other Ambulatory Visit (HOSPITAL_BASED_OUTPATIENT_CLINIC_OR_DEPARTMENT_OTHER)

## 2023-11-17 ENCOUNTER — Encounter: Payer: Self-pay | Admitting: Internal Medicine

## 2023-11-17 ENCOUNTER — Ambulatory Visit: Admitting: Internal Medicine

## 2023-11-17 VITALS — BP 102/64 | HR 65 | Ht 65.0 in | Wt 177.0 lb

## 2023-11-17 DIAGNOSIS — R918 Other nonspecific abnormal finding of lung field: Secondary | ICD-10-CM | POA: Diagnosis not present

## 2023-11-17 DIAGNOSIS — Z129 Encounter for screening for malignant neoplasm, site unspecified: Secondary | ICD-10-CM

## 2023-11-17 DIAGNOSIS — F17210 Nicotine dependence, cigarettes, uncomplicated: Secondary | ICD-10-CM | POA: Diagnosis not present

## 2023-11-17 DIAGNOSIS — J449 Chronic obstructive pulmonary disease, unspecified: Secondary | ICD-10-CM | POA: Diagnosis not present

## 2023-11-17 DIAGNOSIS — F17200 Nicotine dependence, unspecified, uncomplicated: Secondary | ICD-10-CM

## 2023-11-17 DIAGNOSIS — Z7185 Encounter for immunization safety counseling: Secondary | ICD-10-CM

## 2023-11-17 DIAGNOSIS — Z2821 Immunization not carried out because of patient refusal: Secondary | ICD-10-CM

## 2023-11-17 MED ORDER — TRELEGY ELLIPTA 100-62.5-25 MCG/ACT IN AEPB: 1.0000 | INHALATION_SPRAY | Freq: Every day | RESPIRATORY_TRACT | 3 refills | Status: AC

## 2023-11-17 MED ORDER — ALBUTEROL SULFATE HFA 108 (90 BASE) MCG/ACT IN AERS: 2.0000 | INHALATION_SPRAY | Freq: Four times a day (QID) | RESPIRATORY_TRACT | 3 refills | Status: AC | PRN

## 2023-11-17 NOTE — Patient Instructions (Addendum)
  COPD, moderate (HCC) with nocturnal hypoxemia   -  stable   Plan - continue trelegy daily - be diligent - continue albuterol  as needed     History of smoking 25-50 pack years   -Glad you are working on quitting and have reduce the amount of smoke  significantly  Plan - Continue work on reducing smoking and ultimately quit  Cancer screening  - CT chest wo contrast  May 2025 with some stble nodule  Plan  - LDCT chest May 2026  - refer back to lung cancer screen program   Vaccine counseling  Plan  - noted refusal of flu vaccine  Follow-up - Return to see APP May 2026 after CT chest

## 2023-11-17 NOTE — Progress Notes (Signed)
 OV 09/02/2017  Chief Complaint  Patient presents with   Follow-up    PFT performed 08/31/17.  Pt states she has been doing well since last visit. States she still becomes SOB with exertion.     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 She continues to do well. No interim complaints or exacerbations. COPD cat score is 33 and she is significant amount of symptoms. Although she does feel stable and better. A CT scan of the chest was in 2017 without any cancer. She continues to smoke unable to quit. There are no new issues. She says she cannot do pulmonary rehabilitation because she has to visit her husband was in a nursing home of bilateral strokes. She is happy with her triple inhaler therapy. But on deeper questioning she does admit out of proportion shortness of breath. In fact rated level V out of 5.    OV 10/12/2019   Subjective:  Patient ID: Rachel Wang, female , DOB: 06/03/1950, age 73 y.o. years. , MRN: [id number],  ADDRESS: [address] [place] [address] PCP  Patient, No Pcp Per Providers : Treatment Team:  Attending Provider: Geronimo Amel, MD   Chief Complaint  Patient presents with   Follow-up    green phlem in throat, runs course for a week.  Not this anymore, more clear than green.     OV 06/07/2020  Subjective:  Patient ID: Rachel Wang, female , DOB: 1950/03/09 , age 27 y.o. , MRN: [id number] , ADDRESS: [address] [place] [address] PCP Lendia Boby CROME, NP-C Patient Care Team: Lendia Boby CROME, NP-C as PCP - General (Family Medicine) Ladona Heinz, MD as Consulting Physician (Cardiology)  This Provider for this visit: Treatment Team:  Attending Provider: Geronimo Amel, MD    06/07/2020 -   Chief Complaint  Patient presents with   Follow-up    Pt had recent CT performed and is here today to discuss the results.  Pt states seh has been doing okay since last visit and denies any real complaints.    Follow-up Gold stage II COPD Follow-up smoking  HPI Rachel Wang 73 y.o. -returns for follow-up.  She says she still smokes and has cut down her smoking though.  She smokes anywhere from 1 to 3 cigarettes daily.  She does not want medication help to quit smoking.  Has COPD stable on Trelegy.  She had CT scan of the chest personally visualized.  No change from 2019.  She does not have any chest pains.  She continues to be reluctant about taking vaccines stable disease     CT Chest data march 2022   IMPRESSION: 1. No significant change from CT 09/18/2017. 2. Bronchiectasis and subpleural cystic change similar to comparison exam. 3. Stable small pulmonary nodules. 4. Coronary artery calcification and Aortic Atherosclerosis (ICD10-I70.0).     Electronically Signed   By: Jackquline Boxer M.D.   On: 04/11/2020 10:29  No results found.     OV 10/12/2020  Subjective:  Patient ID: Rachel Wang, female , DOB: July 31, 1950 , age 71 y.o. , MRN: [id number] , ADDRESS: [address] [place] [address] PCP Lendia Boby CROME, NP-C Patient Care Team: Lendia Boby CROME, NP-C as PCP - General (Family Medicine) Ladona Heinz, MD as Consulting Physician (Cardiology)  This Provider for this visit: Treatment Team:  Attending Provider: Geronimo Amel, MD    Follow-up Gold stage II COPD Follow-up smoking  10/12/2020 -  Chief Complaint  Patient presents with   Follow-up    Patient reports that she has not had trelegy x 1 month, She reports worse since she has not been able to get on this medication.      HPI Rachel Wang 73 y.o. -  Advanced COPD Ongoing smoking Vaccine hesitancy Coronary artery calcification Cancer screening last CT scan March 2022 without nodules   HPI Rachel Wang 72 y.o. -returns for follow-up.  She tells me that in August 2022 she ran out of her Trelegy because of cost issues.  The co-pay for 3 months went up from $150 plus to $400 plus.   She did not call our office because she did not know we could help.  She thinks that the donut hole issue.  She is not aware of any insurance plan change.  Then in the last few to several week she is having increasing cough, chest tightness, shortness of breath and green sputum.  She feels it is the worst she has been but she does not think she needs an admission.  She is cut down on her smoking.  Last visit I referred her to cardiology but apparently the referral did not go through.  She is willing to look at a repeat referral.   CAT Score 10/12/2020 03/02/2017  Total CAT Score 22 36      01/11/2021 - app visit    73 year old female, current every day smoker. PMH significant for COPD, centrilobular emphysema, CAD, GERD, hyperlipidemia. Patient of Dr. Geronimo, last seen on 10/12/20.    Patient presents today for 3 month follow-up. During last visit she was given sample of Breztri , she has been without maintenance inhaler for the last three week as she can not afford prescription. Patient developed productive cough last week, she is getting up green mucus. She prefers trelegy over breztri . Breztri  worked ok but she still had wheezing and she was not always able to remember to use it twice daily. She does not have a rescue inhaler available.     OV 05/05/2021  Subjective:  Patient ID: Rachel Wang, female , DOB: 06/29/1950 , age 70 y.o. , MRN: [id number] , ADDRESS: [address] [place] [address] PCP No primary care provider on file. Patient Care Team: Ladona Heinz, MD as Consulting Physician (Cardiology)  This Provider for this visit: Treatment Team:  Attending Provider: Geronimo Amel, MD    05/05/2021 -chest Chief Complaint  Patient presents with   Follow-up    Pt states she has been okay since last visit.    Advanced COPD Ongoing smoking Vaccine hesitancy Coronary artery calcification  - neg nucl stress test 2019  - Dr Jeffrie Nov 2022 Cancer screening last CT scan  March 2022 without nodules  -  HPI Rachel Wang 73 y.o. -followup visit. Overall feels pretty good. Still smokes. Dealing with insomnia. Appetitie is so s and poor . On trelegy. Last prednisone  with APP x 2  in 2022. Cut down smoking to  2 cig in  2 weeks. Did not trake trelegy 1- 3 days. Last CT March 2022       OV 08/13/2021  Subjective:  Patient ID: Rachel Wang, female , DOB: [birth year] , age 68 y.o. , MRN: [id number] , ADDRESS: [address] [place] [address] PCP Jess Llano, MD Patient Care Team: Jess Llano, MD as PCP - General Ladona Heinz, MD as Consulting Physician (Cardiology)  This Provider for this visit: Treatment Team:  Attending Provider: Geronimo Amel,  MD   -  08/13/2021 -   Chief Complaint  Patient presents with   Follow-up    COPD follow up. Pt states that her breathing is doing well. She states she had a sharp pain that is in her jaw and left arm pain      HPI Rachel Wang 73 y.o. -returns for follow-up.  Since her last visit no further exacerbations.  She continues Trelegy.  She feels her respiratory status is good.  Yesterday the air quality was poor and there was dense humidity and she did have some cough but that is resolved.  She is changing DME companies.  She wants renewal on her nighttime oxygen  which she uses occasionally.  She also wants order for portable oxygen .  I did indicate to her that she will require retesting.  She is okay with this.  In terms of smoking she cut down to 1 cigarette/day within 2 weeks ago her son who lives in [place]  had a cardiac arrest but he is now survived.  Therefore she is gone up on her smoking.  Last visit I ordered a low-dose CT scan of the chest but this has not been done.  I do not know why.  She is willing to get this done.  Of note yesterday she developed some left neck pain that went into her occiput.  Today she has the same but also started developing tingling around the left jaw and  then also paresthesias in the left arm.  We did an EKG I personally visualized it and it is normal.    OV 09/16/21  Since her last visit jaw pain has resolved. He biggest complaint is fatigue. She has advanced COPD and is maintained on Trelegy. Cough is baseline, improved. She just got back from a trip to [place] . She walked several miles a day, she had trouble walking up the stairs to the sub way. She did not need to use albuterol  rescue inhaler. ONO on 08/21/21 done on room air that showed she spent 2 hours 33 min with SpO2 less than or equal to 88%. She was started on 2L oxygen  at night. She is getting oxygen  delivered this Thursday. She had low dose CT chest on 8/19, results have not been read by radiologist. Previous imaging in 2022 showed no nodules. She continues to work on smoking cessation, she smokes 2-3 cigarettes a day. CAT score 16.   OV 05/29/2022  Subjective:  Patient ID: Rachel Wang, female , DOB: [birth year] , age 67 y.o. , MRN: [id number] , ADDRESS: [address] [place] [address] PCP Jess Llano, MD Patient Care Team: Jess Llano, MD as PCP - General Ladona Heinz, MD as Consulting Physician (Cardiology)  This Provider for this visit: Treatment Team:  Attending Provider: Geronimo Amel, MD  05/29/2022 -   Chief Complaint  Patient presents with   Follow-up    Doing pretty good states she has been coughing up green phlegm on and off over the last couple of weeks     HPI Rachel Wang 73 y.o. -returns for follow-up.  Last seen in July 2023 by myself in August 2022 by the nurse practitioner  Cancer screening: Last CT scan August 2023.  1 year CT scan will be in August 2024  Smoking: She is reduce it to 1 cigarette per 2 or 3 weeks.  She is doing well she is proud of it.  Does not want medications  COPD with nocturnal hypoxemia.: She says she is stable.  Trelegy is working really well for her.  She is now on nighttime oxygen  since summer 2023.  However  for the last few weeks is having increased sinus congestion and coughing with green sputum that is of mild to moderate amount.  For the.  She believes it is because of the pollen.  Somewhat better in the last 1 week.  There is slightly increased cough but no change in wheezing or shortness of breath.  No paroxysmal nocturnal dyspnea no orthopnea.  Review of the labs indicate in the past blood eosinophils have been 300 cells per cubic millimeter.  She is willing to get blood eosinophils and IgE rechecked.     OV 11/17/2023  Subjective:  Patient ID: Rachel Wang, female , DOB: [birth year] , age 55 y.o. , MRN: [id number] , ADDRESS: [address] [place] [address] PCP Health, [facility] Patient Care Team: Health, [address] as PCP - [place] Ladona Heinz, MD as Consulting Physician (Cardiology)  This Provider for this visit: Treatment Team:  Attending Provider: Geronimo Amel, MD    Advanced COPD -Nighttime oxygen  as needed -Trelegy  Ongoing smoking   Vaccine hesitancy  Coronary artery calcification  - neg nucl stress test 2019  - Dr Jeffrie Nov 2022  Cancer screening -  last CT scan  May 2025 in ER ; PE ruled out  Covid aug 2025   11/17/2023 -   Chief Complaint  Patient presents with   Medical Management of Chronic Issues   COPD    She had Covid 19 mid August 2025, did not have to take antiviral. She feels like she has been stable recently.      HPI Rachel Wang 73 y.o. -flolllowup. Not seen in office since May 2024. Ended up in ER  a  year later which is May 2025 this year with cough x 1 week and prodcutive sputum. HAd CTA ruled out PE. Had stable multiple pulmonary noduels. We then called  her in sept 2025 and arranged this followup. Says doing Very good . She is on trelegy which works well. Still smoking but reduced a lot. Rejected flu shot    CT Chest data from date: May 2025  - personally visualized and independently interpreted : no - my  findings are: as below  IMPRESSION: 1. No pulmonary emboli or acute abnormality. 2. Multiple small bilateral pulmonary nodules, unchanged. Recommend follow-up with continued annual screening with a low-dose chest CT without contrast in 1 year. 3. Mildly right hilar adenopathy, nonspecific. 4.  Calcific coronary artery and aortic atherosclerosis.   Aortic Atherosclerosis (ICD10-I70.0) and Emphysema (ICD10-J43.9).     Electronically Signed   By: Elspeth Bathe M.D.   On: 06/23/2023 17:24    PFT     Latest Ref Rng & Units 08/31/2017    3:52 PM 12/28/2015    1:45 PM  PFT Results  FVC-Pre L 1.51  1.66   FVC-Predicted Pre % 58  63   FVC-Post L 1.54  1.68   FVC-Predicted Post % 59  63   Pre FEV1/FVC % % 86  75   Post FEV1/FCV % % 85  78   FEV1-Pre L 1.29  1.24   FEV1-Predicted Pre % 64  60   FEV1-Post L 1.31  1.30   DLCO uncorrected ml/min/mmHg 13.80  9.64   DLCO UNC% % 53  37   DLCO corrected ml/min/mmHg 13.98    DLCO COR %Predicted % 54    DLVA Predicted % 95  78   TLC L  3.52   TLC % Predicted %  67   RV % Predicted %  89     Latest Reference Range & Units 04/03/17 14:01 07/18/17 14:20 05/29/22 16:45 06/24/22 17:10 06/17/23 18:22  Eosinophils Absolute 0.0 - 0.5 K/uL 0.3 0.1 0.3 0.2 0.1      LAB RESULTS last 96 hours No results found.       has a past medical history of Bronchitis, Claustrophobia, COPD (chronic obstructive pulmonary disease) (HCC), Emphysema of lung (HCC), Hyperlipidemia, Stroke (HCC), and Vertigo.   reports that she has been smoking cigarettes. She started smoking about 56 years ago. She has a 28.3 pack-year smoking history. She has been exposed to tobacco smoke. She has never used smokeless tobacco.  Past Surgical History:  Procedure Laterality Date   TUBAL LIGATION      Allergies  Allergen Reactions   Levofloxacin Nausea And Vomiting   Tramadol Nausea And Vomiting   Adhesive [Tape] Rash and Other (See Comments)    WELTS, also   Latex  Rash and Other (See Comments)    WELTS, also    Immunization History  Administered Date(s) Administered   Pneumococcal Conjugate-13 04/03/2017   Pneumococcal Polysaccharide-23 11/15/2018    Family History  Problem Relation Age of Onset   HIV Father    Lung cancer Sister    Lung cancer Paternal Aunt    Stroke Maternal Grandmother    Heart attack Maternal Grandfather    Stroke Paternal Grandmother    Hypertension Paternal Grandmother    Heart attack Paternal Grandfather      Current Outpatient Medications:    aspirin  81 MG chewable tablet, Chew 81 mg by mouth daily., Disp: , Rfl:    bisoprolol  (ZEBETA ) 5 MG tablet, Take 0.5 tablets (2.5 mg total) by mouth daily., Disp: 90 tablet, Rfl: 1   calcium  carbonate (TUMS - DOSED IN MG ELEMENTAL CALCIUM ) 500 MG chewable tablet, Chew 1-2 tablets by mouth as needed for indigestion or heartburn., Disp: , Rfl:    ezetimibe (ZETIA) 10 MG tablet, Take 10 mg by mouth daily., Disp: , Rfl:    Multiple Vitamins-Minerals (HAIR SKIN & NAILS ADVANCED) TABS, Take 1 tablet by mouth daily., Disp: , Rfl:    Multiple Vitamins-Minerals (MULTIVITAMIN ADULTS 50+ PO), Take 1 tablet by mouth daily. , Disp: , Rfl:    VITAMIN D PO, Take 1,000 Int'l Units/kg/day by mouth., Disp: , Rfl:    albuterol  (VENTOLIN  HFA) 108 (90 Base) MCG/ACT inhaler, Inhale 2 puffs into the lungs every 6 (six) hours as needed for wheezing or shortness of breath., Disp: 18 g, Rfl: 3   TRELEGY ELLIPTA  100-62.5-25 MCG/ACT AEPB, Inhale 1 puff into the lungs daily., Disp: 180 each, Rfl: 3      Objective:   Vitals:   11/17/23 1517  BP: 102/64  Pulse: 65  SpO2: 97%  Weight: 177 lb (80.3 kg)  Height: 5' 5 (1.651 m)    Estimated body mass index is 29.45 kg/m as calculated from the following:   Height as of this encounter: 5' 5 (1.651 m).   Weight as of this encounter: 177 lb (80.3 kg).  @WEIGHTCHANGE @  Filed Weights   11/17/23 1517  Weight: 177 lb (80.3 kg)     Physical  Exam   General: No distress. Looks well O2 at rest: no Cane present: no Sitting in wheel chair: no Frail: no Obese: no Neuro: Alert and Oriented x 3. GCS 15. Speech normal Psych: Pleasant  Resp:  Barrel Chest - no.  Wheeze - no, Crackles - no, No overt respiratory distress CVS: Normal heart sounds. Murmurs - no Ext: Stigmata of Connective Tissue Disease - no HEENT: Normal upper airway. PEERL +. No post nasal drip        Assessment/     Assessment & Plan COPD, moderate (HCC)  Cancer screening  Multiple lung nodules on CT  Vaccine counseling  Current every day smoker    PLAN Patient Instructions   COPD, moderate (HCC) with nocturnal hypoxemia   -  stable   Plan - continue trelegy daily - be diligent - continue albuterol  as needed     History of smoking 25-50 pack years   -Glad you are working on quitting and have reduce the amount of smoke  significantly  Plan - Continue work on reducing smoking and ultimately quit  Cancer screening  - CT chest wo contrast  May 2025 with some stble nodule  Plan  - LDCT chest May 2026  - refer back to lung cancer screen program   Vaccine counseling  Plan  - noted refusal of flu vaccine  Follow-up - Return to see APP May 2026 after CT chest    FOLLOWUP    Return for - Return to see APP May 2026 after CT chest.    SIGNATURE    Dr. Dorethia Cave, M.D., F.C.C.P,  Pulmonary and Critical Care Medicine Staff Physician, [facility] Director - Interstitial Lung Disease  Program  Pulmonary Fibrosis [facility] at [facility] [place], [place], [address]  Pager: [phone], If no answer or between  15:00h - 7:00h: call [phone] Telephone: [phone]  10:09 PM 11/17/2023

## 2024-01-26 ENCOUNTER — Ambulatory Visit
Admission: RE | Admit: 2024-01-26 | Discharge: 2024-01-26 | Disposition: A | Discharge status: Home / Self Care | Source: Ambulatory Visit | Attending: Family Medicine | Admitting: Family Medicine

## 2024-01-26 ENCOUNTER — Other Ambulatory Visit: Payer: Self-pay | Admitting: Family Medicine

## 2024-01-26 DIAGNOSIS — M25551 Pain in right hip: Secondary | ICD-10-CM
